# Patient Record
Sex: Female | Born: 1940
Health system: Southern US, Community
[De-identification: ages and names within clinical notes are randomized; demographics above are authoritative.]

## PROBLEM LIST (undated history)

## (undated) DIAGNOSIS — G43909 Migraine, unspecified, not intractable, without status migrainosus: Secondary | ICD-10-CM

## (undated) DIAGNOSIS — C73 Malignant neoplasm of thyroid gland: Secondary | ICD-10-CM

## (undated) DIAGNOSIS — E039 Hypothyroidism, unspecified: Secondary | ICD-10-CM

## (undated) DIAGNOSIS — E063 Autoimmune thyroiditis: Secondary | ICD-10-CM

## (undated) HISTORY — DX: Autoimmune thyroiditis: E06.3

## (undated) HISTORY — PX: FOOT SURGERY: SHX648

## (undated) HISTORY — DX: Malignant neoplasm of thyroid gland: C73

## (undated) HISTORY — DX: Migraine, unspecified, not intractable, without status migrainosus: G43.909

## (undated) HISTORY — DX: Hypothyroidism, unspecified: E03.9

## (undated) HISTORY — PX: APPENDECTOMY: SHX54

## (undated) HISTORY — PX: THYROIDECTOMY: SHX17

## (undated) HISTORY — PX: CHOLECYSTECTOMY: SHX55

---

## 1999-01-08 ENCOUNTER — Other Ambulatory Visit: Admission: RE | Admit: 1999-01-08 | Discharge: 1999-01-08 | Payer: Self-pay | Admitting: Obstetrics and Gynecology

## 2000-04-28 ENCOUNTER — Encounter: Admission: RE | Admit: 2000-04-28 | Discharge: 2000-04-28 | Payer: Self-pay | Admitting: Obstetrics and Gynecology

## 2000-04-28 ENCOUNTER — Encounter: Payer: Self-pay | Admitting: Obstetrics and Gynecology

## 2000-06-22 ENCOUNTER — Other Ambulatory Visit: Admission: RE | Admit: 2000-06-22 | Discharge: 2000-06-22 | Payer: Self-pay | Admitting: Family Medicine

## 2001-05-04 ENCOUNTER — Observation Stay (HOSPITAL_COMMUNITY): Admission: RE | Admit: 2001-05-04 | Discharge: 2001-05-05 | Payer: Self-pay | Admitting: Orthopedic Surgery

## 2001-07-06 ENCOUNTER — Encounter: Payer: Self-pay | Admitting: Obstetrics and Gynecology

## 2001-07-06 ENCOUNTER — Encounter: Admission: RE | Admit: 2001-07-06 | Discharge: 2001-07-06 | Payer: Self-pay | Admitting: Obstetrics and Gynecology

## 2002-01-24 ENCOUNTER — Encounter: Payer: Self-pay | Admitting: Family Medicine

## 2002-01-24 ENCOUNTER — Encounter: Admission: RE | Admit: 2002-01-24 | Discharge: 2002-01-24 | Payer: Self-pay | Admitting: Family Medicine

## 2002-03-07 ENCOUNTER — Other Ambulatory Visit: Admission: RE | Admit: 2002-03-07 | Discharge: 2002-03-07 | Payer: Self-pay | Admitting: Obstetrics and Gynecology

## 2002-04-20 ENCOUNTER — Ambulatory Visit (HOSPITAL_COMMUNITY): Admission: RE | Admit: 2002-04-20 | Discharge: 2002-04-20 | Payer: Self-pay | Admitting: Orthopedic Surgery

## 2002-04-20 ENCOUNTER — Encounter: Payer: Self-pay | Admitting: Orthopedic Surgery

## 2002-10-18 ENCOUNTER — Encounter: Admission: RE | Admit: 2002-10-18 | Discharge: 2002-10-18 | Payer: Self-pay | Admitting: Obstetrics and Gynecology

## 2002-10-18 ENCOUNTER — Encounter: Payer: Self-pay | Admitting: Obstetrics and Gynecology

## 2003-11-20 ENCOUNTER — Other Ambulatory Visit: Admission: RE | Admit: 2003-11-20 | Discharge: 2003-11-20 | Payer: Self-pay | Admitting: Obstetrics and Gynecology

## 2003-12-25 ENCOUNTER — Ambulatory Visit (HOSPITAL_COMMUNITY): Admission: RE | Admit: 2003-12-25 | Discharge: 2003-12-25 | Payer: Self-pay | Admitting: Obstetrics and Gynecology

## 2004-08-28 ENCOUNTER — Ambulatory Visit (HOSPITAL_COMMUNITY): Admission: RE | Admit: 2004-08-28 | Discharge: 2004-08-28 | Payer: Self-pay | Admitting: Family Medicine

## 2004-12-01 ENCOUNTER — Other Ambulatory Visit: Admission: RE | Admit: 2004-12-01 | Discharge: 2004-12-01 | Payer: Self-pay | Admitting: Endocrinology

## 2005-01-06 ENCOUNTER — Observation Stay (HOSPITAL_COMMUNITY): Admission: RE | Admit: 2005-01-06 | Discharge: 2005-01-07 | Payer: Self-pay | Admitting: Surgery

## 2005-01-06 ENCOUNTER — Encounter (INDEPENDENT_AMBULATORY_CARE_PROVIDER_SITE_OTHER): Payer: Self-pay | Admitting: Specialist

## 2005-01-21 ENCOUNTER — Emergency Department (HOSPITAL_COMMUNITY): Admission: EM | Admit: 2005-01-21 | Discharge: 2005-01-21 | Payer: Self-pay | Admitting: Emergency Medicine

## 2005-01-26 ENCOUNTER — Encounter (HOSPITAL_COMMUNITY): Admission: RE | Admit: 2005-01-26 | Discharge: 2005-04-26 | Payer: Self-pay | Admitting: Endocrinology

## 2005-03-09 ENCOUNTER — Ambulatory Visit (HOSPITAL_COMMUNITY): Admission: RE | Admit: 2005-03-09 | Discharge: 2005-03-09 | Payer: Self-pay | Admitting: Obstetrics and Gynecology

## 2005-03-17 ENCOUNTER — Other Ambulatory Visit: Admission: RE | Admit: 2005-03-17 | Discharge: 2005-03-17 | Payer: Self-pay | Admitting: Obstetrics and Gynecology

## 2006-01-25 ENCOUNTER — Encounter (HOSPITAL_COMMUNITY): Admission: RE | Admit: 2006-01-25 | Discharge: 2006-04-25 | Payer: Self-pay | Admitting: Endocrinology

## 2006-01-29 ENCOUNTER — Encounter (HOSPITAL_COMMUNITY): Admission: RE | Admit: 2006-01-29 | Discharge: 2006-04-29 | Payer: Self-pay | Admitting: Endocrinology

## 2006-03-10 ENCOUNTER — Ambulatory Visit (HOSPITAL_COMMUNITY): Admission: RE | Admit: 2006-03-10 | Discharge: 2006-03-10 | Payer: Self-pay | Admitting: Obstetrics and Gynecology

## 2006-07-12 ENCOUNTER — Ambulatory Visit (HOSPITAL_COMMUNITY): Admission: RE | Admit: 2006-07-12 | Discharge: 2006-07-12 | Payer: Self-pay | Admitting: Endocrinology

## 2006-08-03 ENCOUNTER — Emergency Department (HOSPITAL_COMMUNITY): Admission: EM | Admit: 2006-08-03 | Discharge: 2006-08-04 | Payer: Self-pay | Admitting: Emergency Medicine

## 2006-11-02 HISTORY — PX: BREAST EXCISIONAL BIOPSY: SUR124

## 2007-03-29 ENCOUNTER — Ambulatory Visit (HOSPITAL_COMMUNITY): Admission: RE | Admit: 2007-03-29 | Discharge: 2007-03-29 | Payer: Self-pay | Admitting: Obstetrics and Gynecology

## 2007-04-05 ENCOUNTER — Encounter (INDEPENDENT_AMBULATORY_CARE_PROVIDER_SITE_OTHER): Payer: Self-pay | Admitting: Diagnostic Radiology

## 2007-04-05 ENCOUNTER — Encounter: Admission: RE | Admit: 2007-04-05 | Discharge: 2007-04-05 | Payer: Self-pay | Admitting: Obstetrics and Gynecology

## 2007-04-20 ENCOUNTER — Encounter: Admission: RE | Admit: 2007-04-20 | Discharge: 2007-04-20 | Payer: Self-pay | Admitting: Interventional Radiology

## 2007-06-09 ENCOUNTER — Encounter: Admission: RE | Admit: 2007-06-09 | Discharge: 2007-06-09 | Payer: Self-pay | Admitting: Interventional Radiology

## 2007-06-13 ENCOUNTER — Encounter (INDEPENDENT_AMBULATORY_CARE_PROVIDER_SITE_OTHER): Payer: Self-pay | Admitting: Surgery

## 2007-06-13 ENCOUNTER — Encounter: Admission: RE | Admit: 2007-06-13 | Discharge: 2007-06-13 | Payer: Self-pay | Admitting: Surgery

## 2007-06-13 ENCOUNTER — Ambulatory Visit (HOSPITAL_BASED_OUTPATIENT_CLINIC_OR_DEPARTMENT_OTHER): Admission: RE | Admit: 2007-06-13 | Discharge: 2007-06-13 | Payer: Self-pay | Admitting: Surgery

## 2007-06-16 ENCOUNTER — Encounter: Admission: RE | Admit: 2007-06-16 | Discharge: 2007-06-16 | Payer: Self-pay | Admitting: Interventional Radiology

## 2007-07-12 ENCOUNTER — Encounter: Admission: RE | Admit: 2007-07-12 | Discharge: 2007-07-12 | Payer: Self-pay | Admitting: Interventional Radiology

## 2007-11-30 ENCOUNTER — Encounter: Admission: RE | Admit: 2007-11-30 | Discharge: 2007-11-30 | Payer: Self-pay | Admitting: Interventional Radiology

## 2008-03-12 IMAGING — US EM OFFICE/OP CONSULT LEVEL 3 (40)
1 series · 12 of 16 positions shown · non-contrast
Comparison: none

[Series 1: em office/op consult level 3 (40) · 12 of 26 slices shown]
[im 1/26]
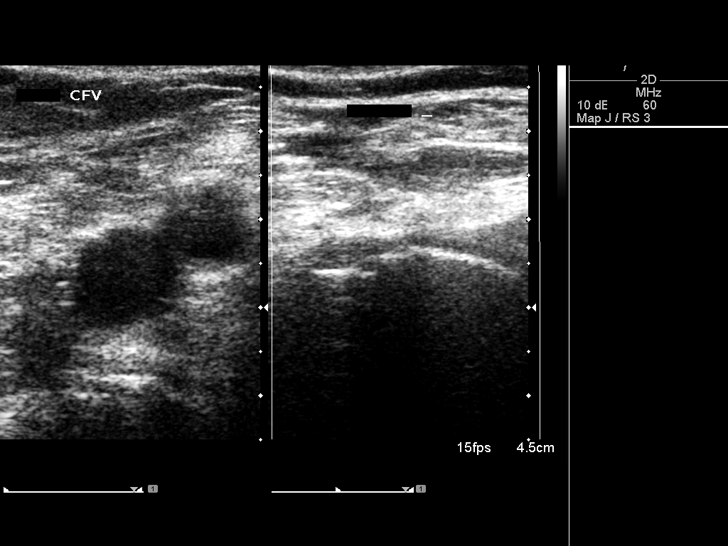
[im 4/26]
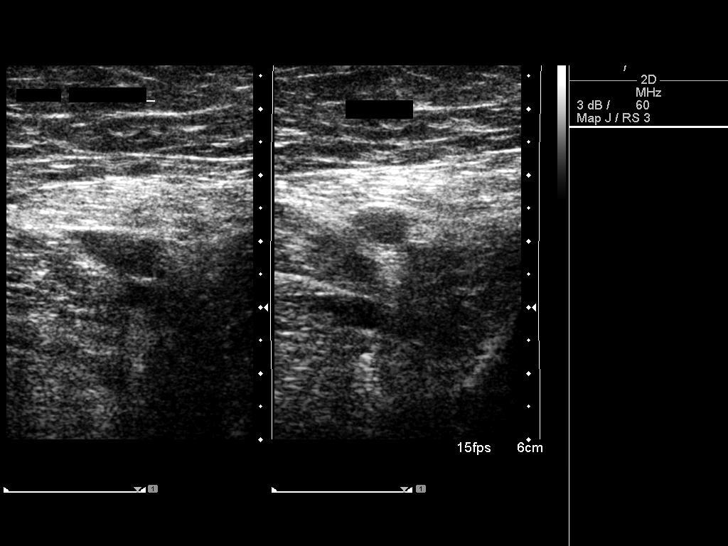
[im 6/26]
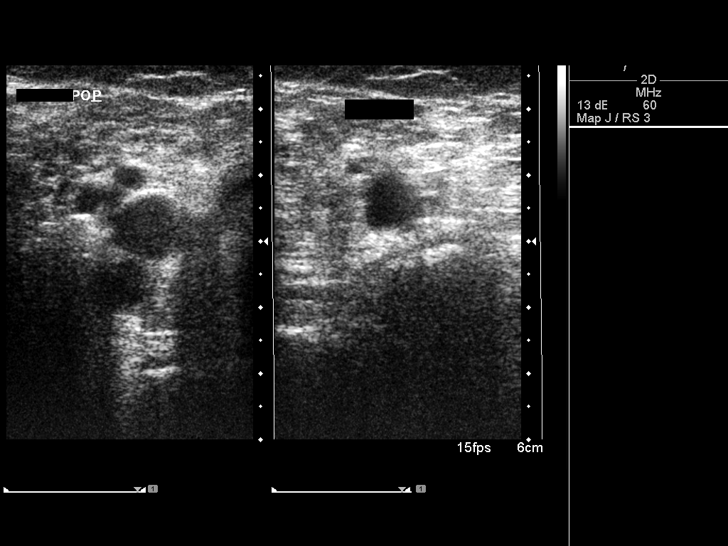
[im 7/26]
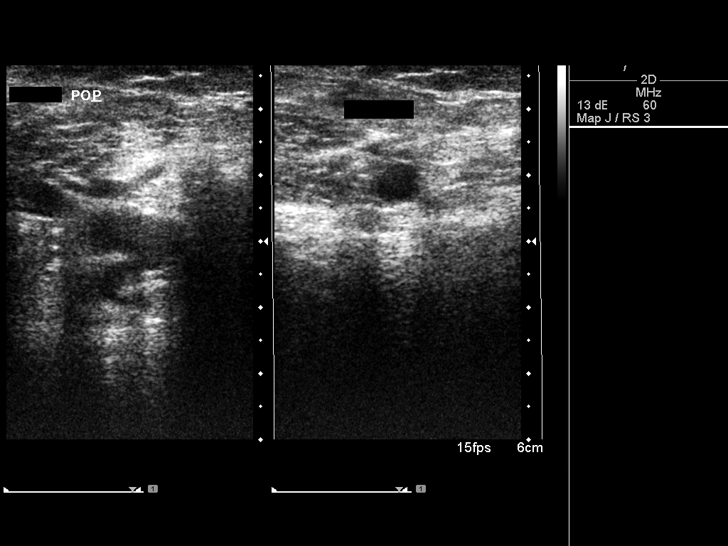
[im 11/26]
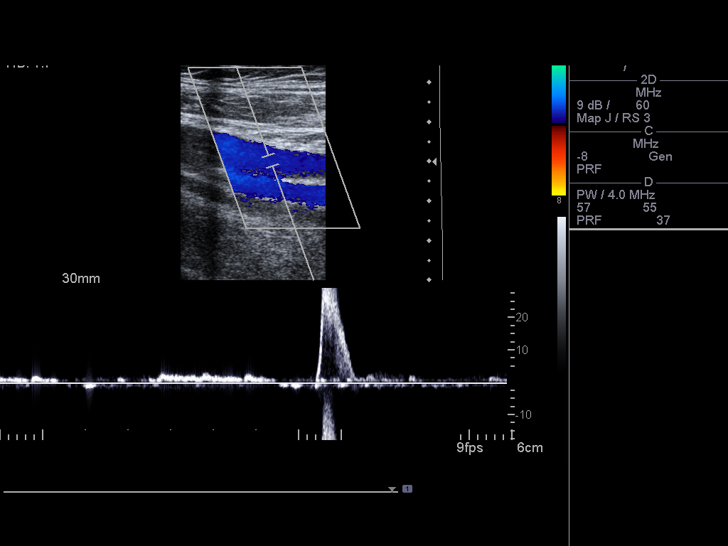
[im 12/26]
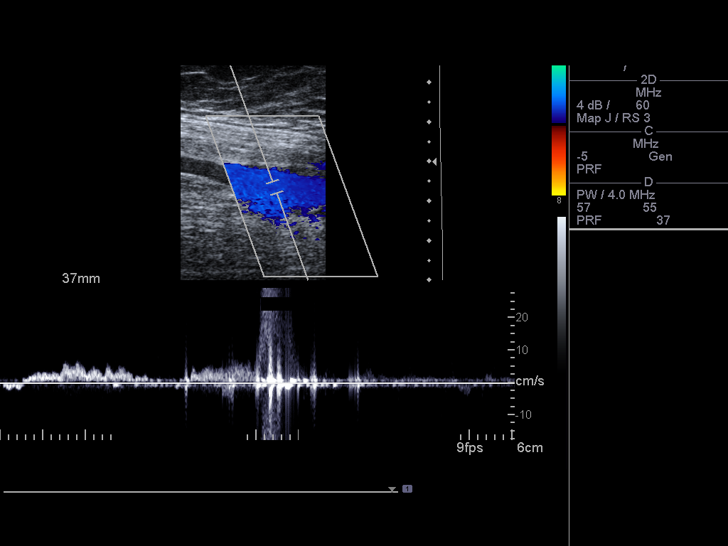
[im 14/26]
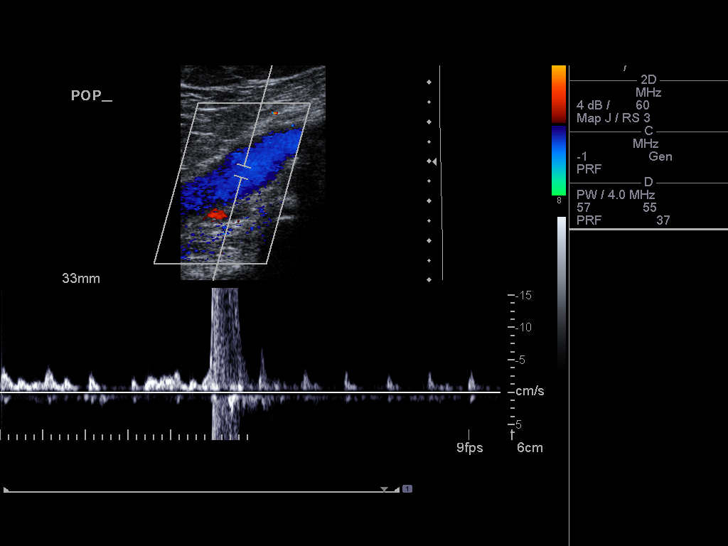
[im 17/26]
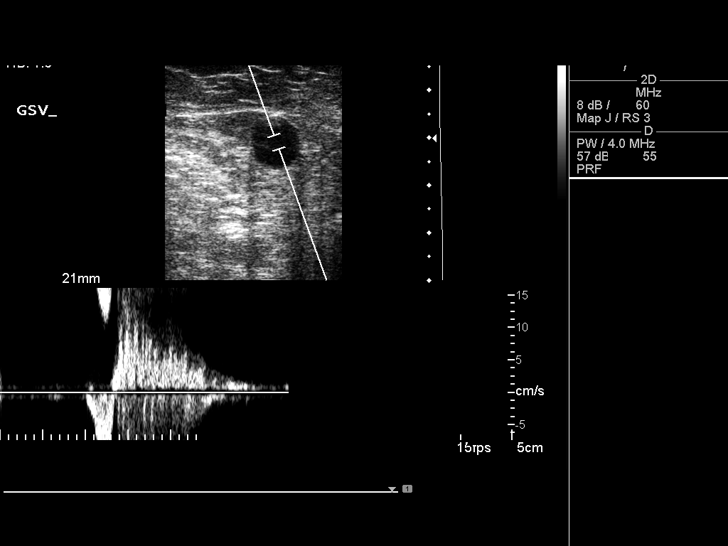
[im 19/26]
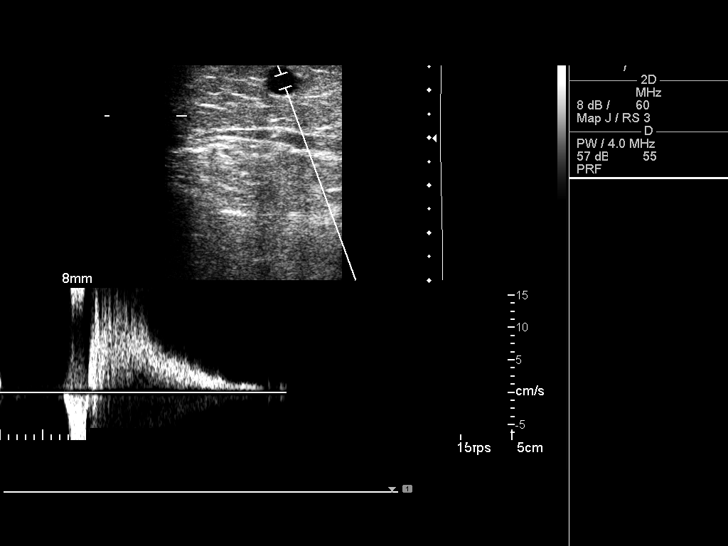
[im 21/26]
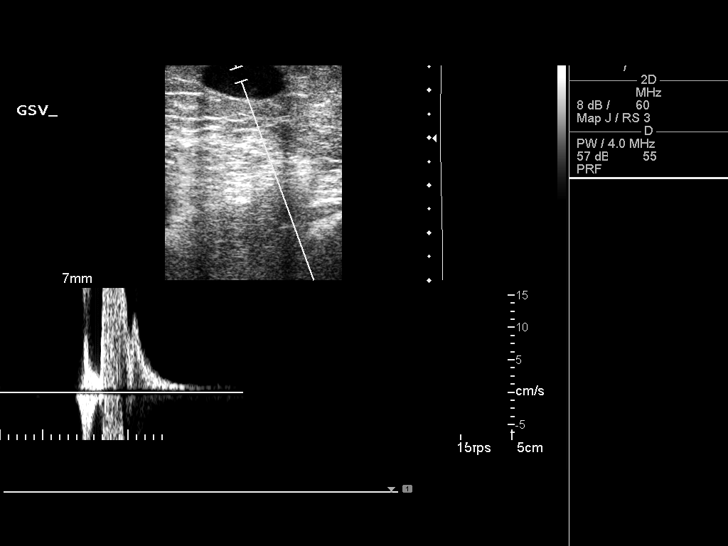
[im 24/26]
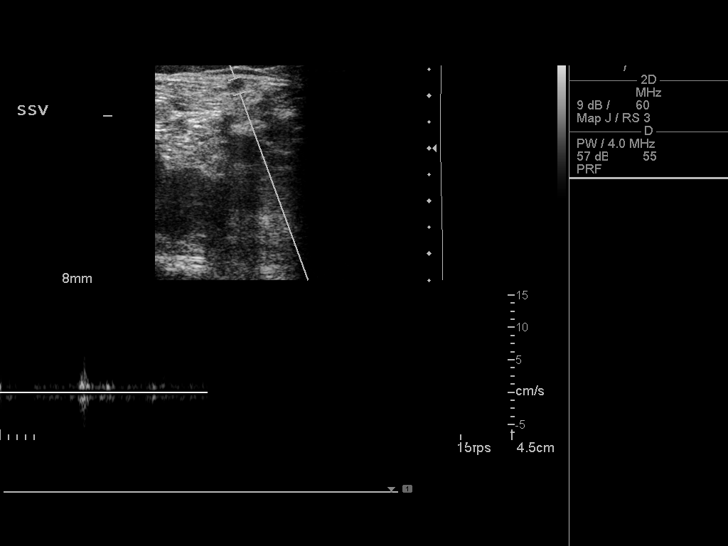
[im 26/26]
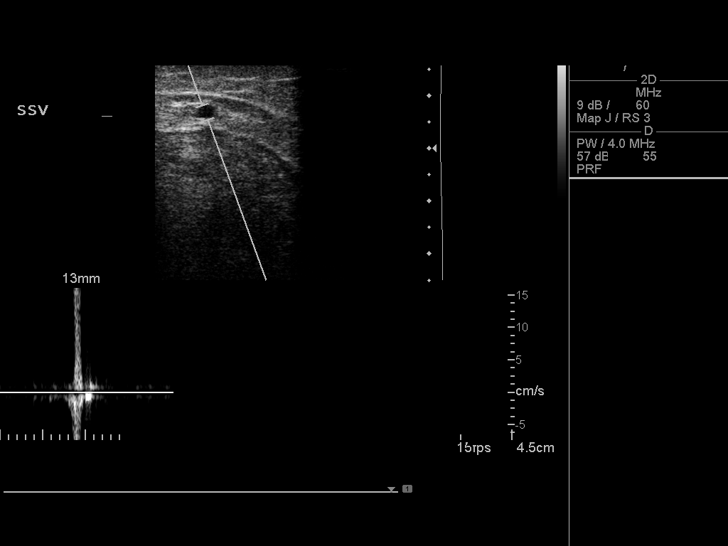

[12 of 16 positions shown; findings below may reference images not displayed]

Addendum Begins
Original report dictated by Dr. Lejla Up.  Addendum dictated by Dr. Lejla Up.
The patient?s symptomatic varicose veins with ankle edema correspond to CEAP Grade III.  The previous evaluation at [facility] on 05/07/04 (enclosed) also demonstrated reflux throughout the left GSV.  There is no evidence of adenopathy to suggest external compression as a cause for her leg swelling.  There is no contralateral swelling to suggest CHF as an etiology.  Her lack of response to the use of graduated compression stockings since 9446 represents failure of traditional conservative treatment options, thus she should be considered an appropriate candidate with medical indications for treatment of the underlying refluxing venous segment as previously detailed.  

Addendum Ends
Outpatient Consultation - [DATE]
Snehal Eells, M.D.
[REDACTED] [REDACTED]
[address]
[HOSPITAL],  [REDACTED].  [id number] 
RE:  Labelle Salha Brack Tiger (DOB - [birth year]):
Dear Dr. Rantona:  
Thank you for the referral of your patient, Klpigbb Moolman, for consultation regarding her symptomatic left lower extremity varicose veins.  
As you know, this pleasant 66 year old [place] immigrant has had several years of visible left leg varicose veins.  She has had previous vein evaluation by other local providers in 9446, but no varicose vein treatments.  Over the past year or two, she has noticed worsening left lower extremity edema primarily at the level of the ankle which is worse at night.  Over the past two months, she has noticed a change in her gait with a limp.  She has left leg pain and tiredness.  The symptoms have reduced her exercise capabilities, and have reduced the distance she can walk to less than a quarter mile.  She has no history of venous ulceration, DVT, superficial thrombophlebitis, or PE.  Her mother did suffer from symptomatic varicose veins as well.  She feels like there has been exacerbation of her varicose veins after her three pregnancies.  She uses Advil for pain control, with partial relief of symptoms.  She finds that elevation of the legs also provides some relief of symptoms.  She has used graduated compression hose intermittently.  
Past medical history is significant for appendectomy, thyroidectomy for thyroid carcinoma in 0666, bunionectomy, cholecystectomy, hypertension, migraines.  
Current medications include Synthroid 125 mcg q day, Atenolol 25 mg q day, Argotamine prn, and Shobhit Mannion.  
She has an allergy to Lipitor.  No known allergy to contrast or Latex.  
Review of systems is negative for myocardial infarction, infarct, bleeding diathesis.  Positive for glasses.  
On exam, she has moderately prominent tortuous varicose veins along the medial aspect of her left thigh and calf extending to the foot.  There is mild left leg edema this morning on exam.  Good distal pulses.  No skin ulceration, discoloration or trophic change.  She is afebrile, pulse 78, blood pressure 126/90, respirations 15, height 5'5", weight 184.  
Today?s venous Doppler examination reported separately shows a normal left leg deep venous system.  The short saphenous vein is unremarkable.  The left greater saphenous vein is dilated with evidence of valvular incompetence and reflux, with direct communication to the large visible varicose veins.  
We spent the majority of the 45 minute consultation discussing pathophysiology of varicose veins, reviewing the ultrasound findings, and discussing varicose vein treatment options.  We discussed in particular the endovascular laser ablation of this underlying abnormal refluxing GSV segment, for which she would be an appropriate candidate.  We discussed the technique, anticipated benefits, possible risks and complications, and alternatives.  I gave her some literature to review.  She seemed to understand and did ask appropriate questions.  She was motivated to proceed with treatment.  Accordingly, we will set this up at her convenience.  In the meantime, I have asked our nurse to fit her appropriately for 20 - 30 mm Hg thigh high compression hose.  
I appreciate your referral of this very pleasant patient.  I will keep you updated with her progress. 
Sincerely, 
DDH:chc

## 2008-06-03 IMAGING — US US EXTREM LOW VENOUS*L*
1 series · 14 of 24 positions shown · non-contrast
Comparison: none

CLINICAL DATA: Symptomatic left leg varicose veins, now one month status post
transcatheter laser occlusion of the   greater saphenous vein.

Left lower extremity venous Doppler ultrasound:
TECHNIQUE: Gray-scale sonography with compression as well as color and duplex
Doppler ultrasound were performed to evaluate   the saphenous vein and the deep
venous system from the level of the common femoral vein through the popliteal
and proximal calf veins.

[Series 1: us extrem low venous*left* · 14 of 31 slices shown]
[im 1/31]
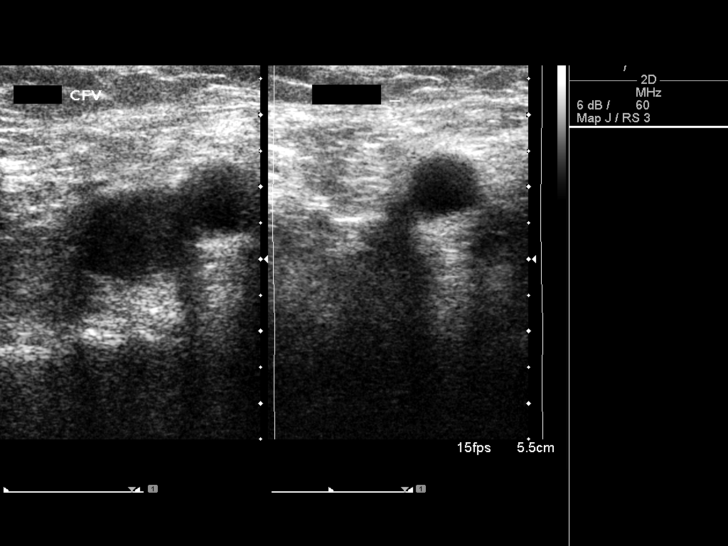
[im 3/31]
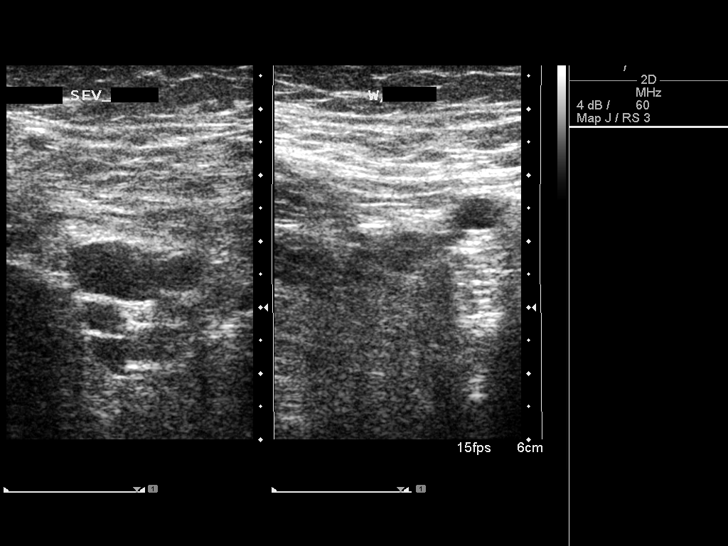
[im 6/31]
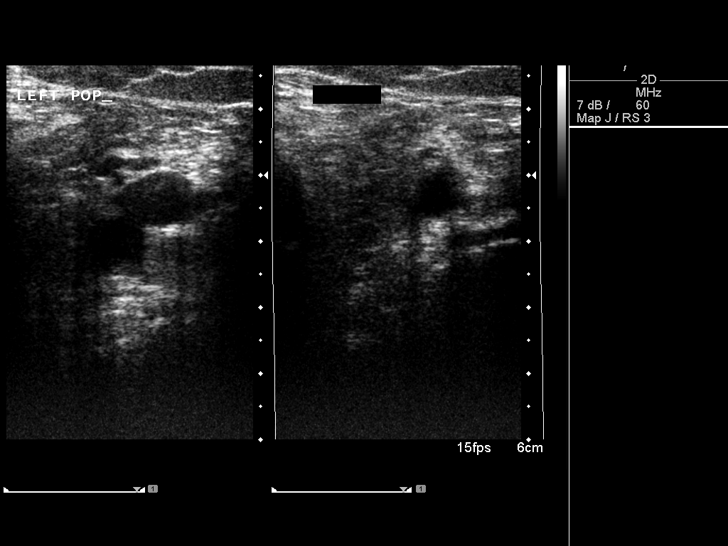
[im 8/31]
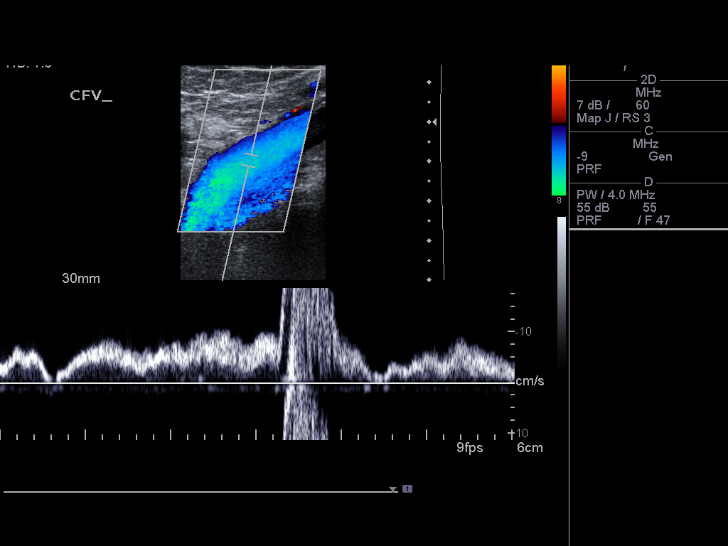
[im 10/31]
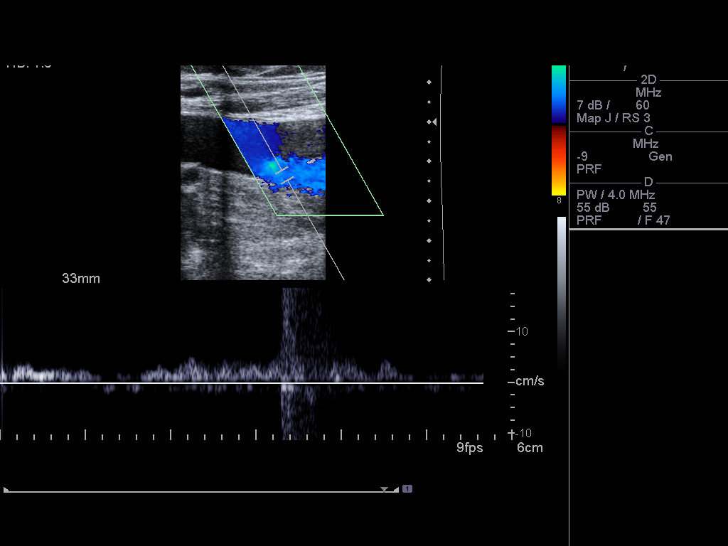
[im 12/31]
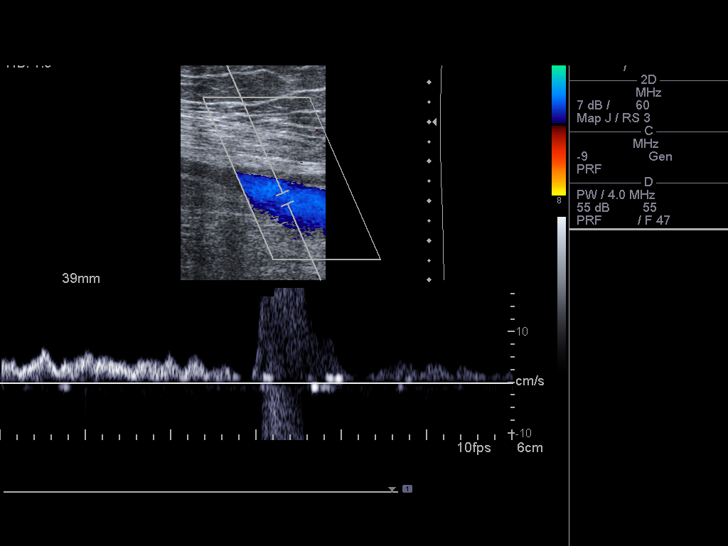
[im 15/31]
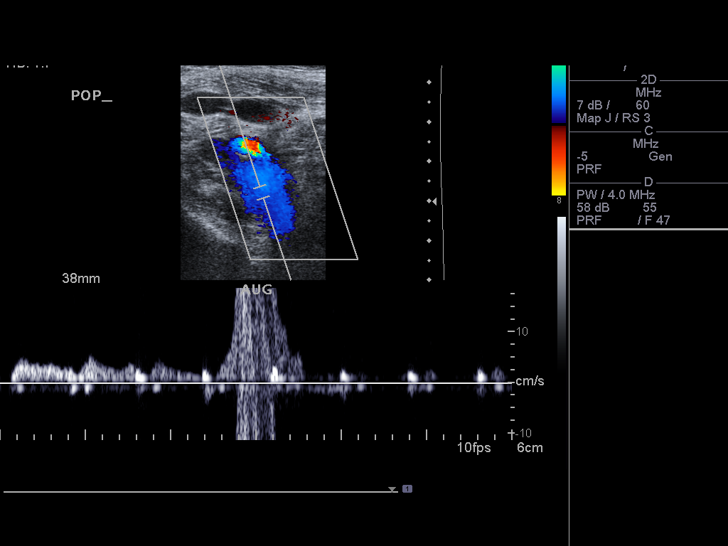
[im 16/31]
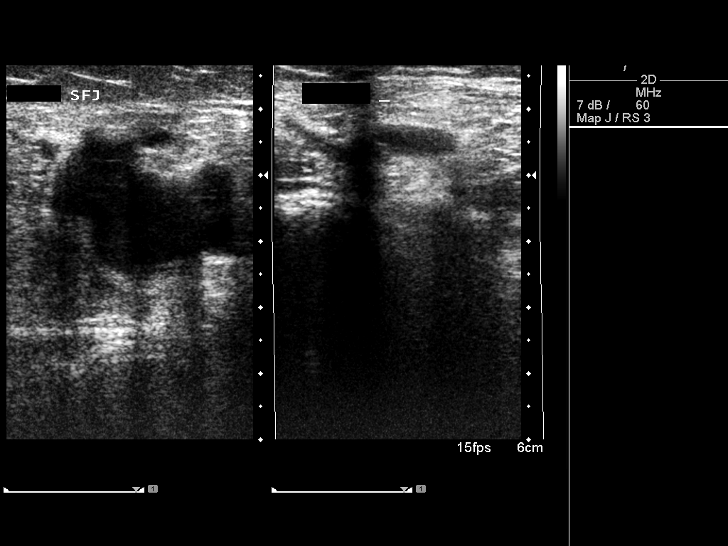
[im 19/31]
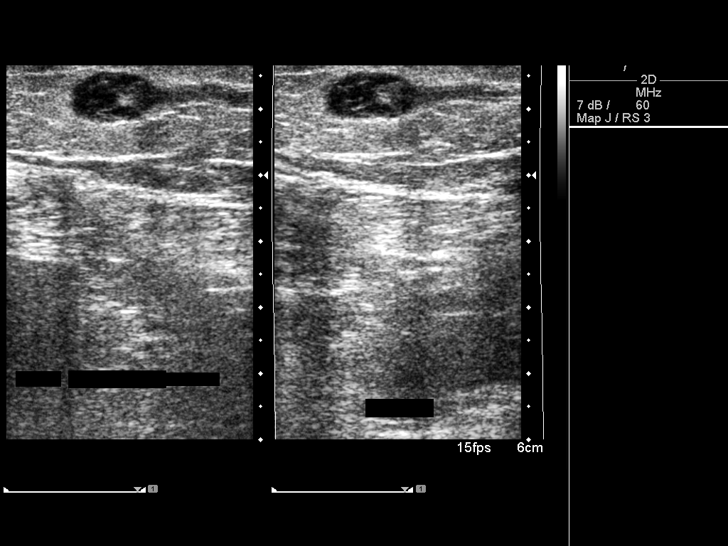
[im 21/31]
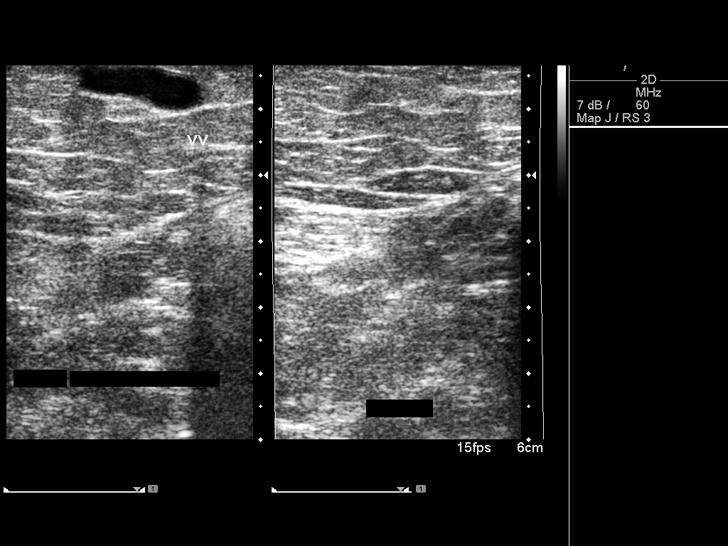
[im 24/31]
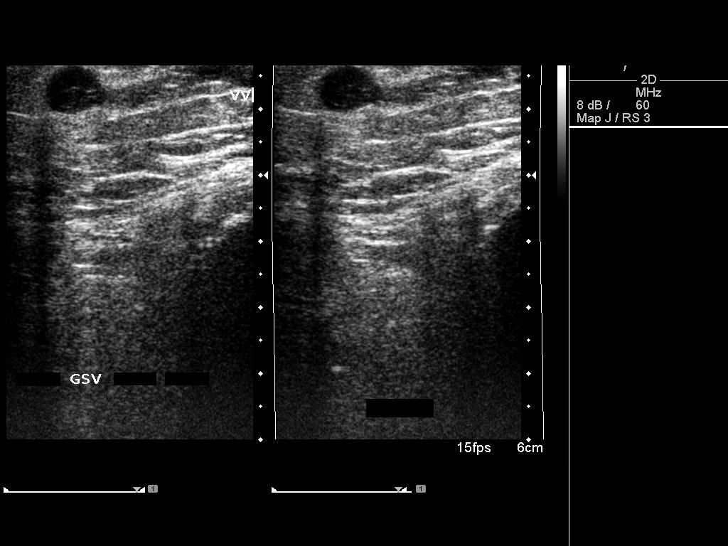
[im 25/31]
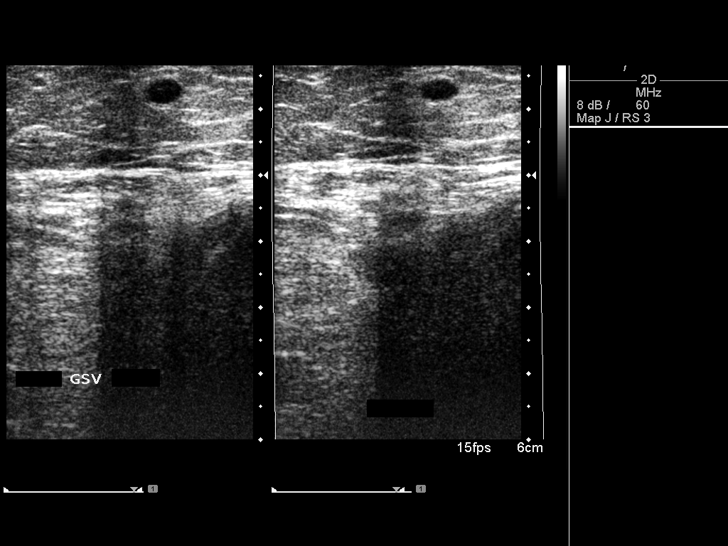
[im 28/31]
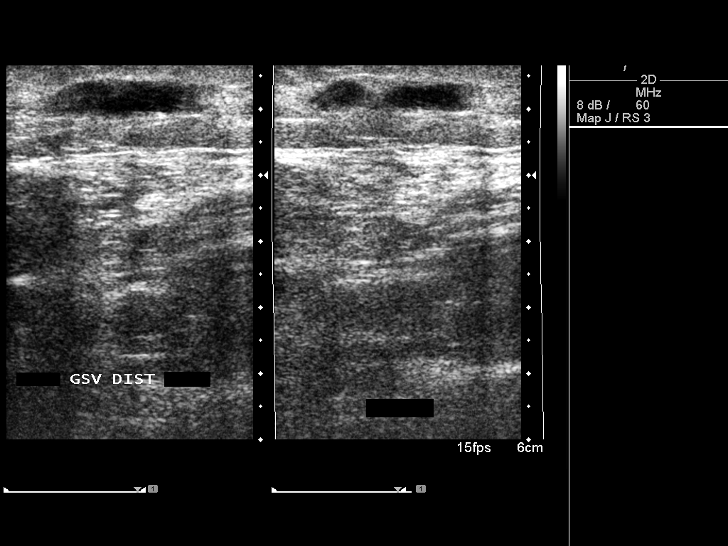
[im 31/31]
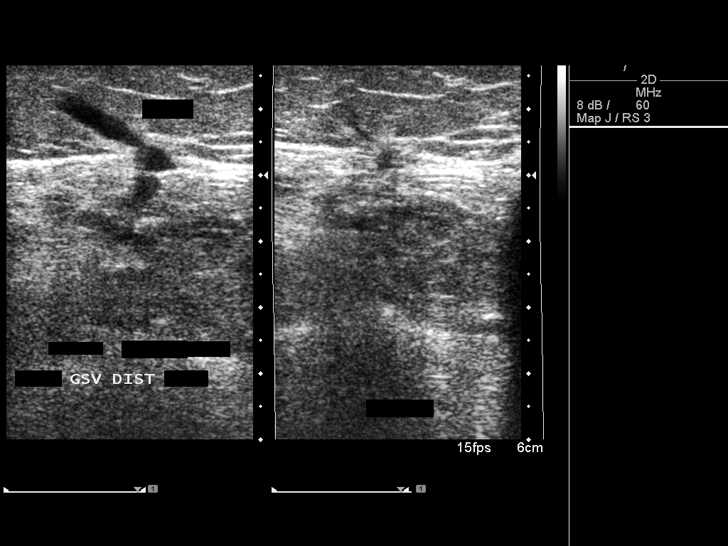

[14 of 24 positions shown; findings below may reference images not displayed]

FINDINGS: The lower extremity deep venous system demonstrates normal
compressibility, phasicity, and augmentation. Posterior tibial vein
unremarkable. No evidence of DVT.

The greater saphenous vein is occluded throughout the treatment segment.
Associated varicose veins in the thigh and calf region are decompressed.
IMPRESSION: 1. Technically successful occlusion of the greater saphenous vein along the
treatment length without apparent complication.
2. Normal deep venous system. No DVT.

## 2009-08-19 ENCOUNTER — Ambulatory Visit (HOSPITAL_COMMUNITY): Admission: RE | Admit: 2009-08-19 | Discharge: 2009-08-19 | Payer: Self-pay | Admitting: Family Medicine

## 2010-09-22 ENCOUNTER — Encounter: Admission: RE | Admit: 2010-09-22 | Discharge: 2010-09-22 | Payer: Self-pay | Admitting: Family Medicine

## 2011-03-16 ENCOUNTER — Ambulatory Visit: Payer: Medicare Other | Attending: Family Medicine | Admitting: Physical Therapy

## 2011-03-16 DIAGNOSIS — M546 Pain in thoracic spine: Secondary | ICD-10-CM | POA: Insufficient documentation

## 2011-03-16 DIAGNOSIS — M2560 Stiffness of unspecified joint, not elsewhere classified: Secondary | ICD-10-CM | POA: Insufficient documentation

## 2011-03-16 DIAGNOSIS — R293 Abnormal posture: Secondary | ICD-10-CM | POA: Insufficient documentation

## 2011-03-16 DIAGNOSIS — IMO0001 Reserved for inherently not codable concepts without codable children: Secondary | ICD-10-CM | POA: Insufficient documentation

## 2011-03-16 DIAGNOSIS — M542 Cervicalgia: Secondary | ICD-10-CM | POA: Insufficient documentation

## 2011-03-17 NOTE — Op Note (Signed)
[name], [name]                  ACCOUNT NO.:  [id number]   MEDICAL RECORD NO.:  [id number]          PATIENT TYPE:  AMB   LOCATION:  DSC                          FACILITY:  [facility]   PHYSICIAN:  Velora Heckler, MD      DATE OF BIRTH:  06/18/1941   DATE OF PROCEDURE:  06/13/2007  DATE OF DISCHARGE:                               OPERATIVE REPORT   PREOPERATIVE DIAGNOSIS:  Abnormal mammogram.   POSTOPERATIVE DIAGNOSIS:  Abnormal mammogram.   PROCEDURE:  Left breast excisional biopsy with wire localization.   SURGEON:  Velora Heckler, MD, FACS   ANESTHESIA:  Local with intravenous sedation.   PREPARATION:  Betadine.   BLOOD LOSS:  Minimal.   INDICATIONS:  The patient is a 70 year old white female well known to my  practice.  The patient had routine screening mammography May 2008.  An  abnormality was found on the left breast.  Core needle biopsy was  obtained.  Pathology results were benign but were felt to be discordant  with mammographic features and excisional biopsy was recommended.  The  patient now comes to the operating room for excision.   DESCRIPTION OF PROCEDURE:  Procedure was done in OR #6 at the [facility].  The patient had previously been to [facility] where wire localization was performed.  The patient is  brought to the operating room #6 and placed in a supine position on the  operating room table.  Following administration of intravenous sedation,  the patient is prepped and draped in the usual strict aseptic fashion.  After ascertaining that an adequate level sedation had been obtained,  the skin around the guidewire in the lateral left breast is anesthetized  with local anesthetic.  A curvilinear incision is  made at the site of  guidewire placement.  Dissection is  carried into subcutaneous tissues.  A core of tissue is excised around the guidewire using the  electrocautery for hemostasis down to the chest wall.  Specimen  is  completely excised and submitted for specimen mammography which confirms  that the lesion in question is present within the sample.  Good  hemostasis is obtained throughout.  Skin is closed with running 4-0 Vicryl subcuticular suture.  Wound is  washed and dried and Benzoin and Steri-Strips are applied.  Sterile  dressings are applied.  The patient is transferred to the recovery room  in stable condition.  The patient tolerated the procedure well.      Velora Heckler, MD  Electronically Signed     TMG/MEDQ  D:  06/13/2007  T:  06/14/2007  Job:  [id number]   cc:   Norva Pavlov, M.D.  Dorisann Frames, M.D.  Anna Genre Little, M.D.

## 2011-03-20 NOTE — Op Note (Signed)
[name], [name]                  ACCOUNT NO.:  [id number]   MEDICAL RECORD NO.:  [id number]          PATIENT TYPE:  AMB   LOCATION:  DAY                          FACILITY:  [facility]   PHYSICIAN:  Melinda Heckler, MD      DATE OF BIRTH:  October 14, 1941   DATE OF PROCEDURE:  01/06/2005  DATE OF DISCHARGE:                                 OPERATIVE REPORT   PREOPERATIVE DIAGNOSIS:  Right thyroid nodule.   POSTOPERATIVE DIAGNOSIS:  Papillary thyroid carcinoma.   PROCEDURE:  Total thyroidectomy.   SURGEON:  Melinda Heckler, MD   ASSISTANT:  [name], M.D.   ANESTHESIA:  General.   ESTIMATED BLOOD LOSS:  Minimal.   PREPARATION:  Betadine.   COMPLICATIONS:  None.   INDICATIONS:  Patient is a 70 year old white female referred by Dr. Dorisann Terry with thyroid nodule.  This has been found on routine physical  examination in January, 2006.  Fine needle aspiration demonstrated atypical  cell groups suspicious for papillary thyroid carcinoma.  The patient now  comes to surgery for resection.   BODY OF REPORT:  The procedure is done in OR #4 at the [facility].  Patient is brought to the operating room and placed in a supine  position on the operating room table.  Following administration of general  anesthesia, the patient is positioned and then prepped and draped in the  usual strict aseptic fashion.  After ascertaining that an adequate level of  anesthesia had been obtained, a Kocher incision is made with a #15 blade.  Dissection is carried down through subcutaneous tissues and platysma, and  hemostasis obtained with the electrocautery.  Skin flaps are elevated  cephalad and caudad from the thyroid notch to the sternal notch.  A Mahorner  self-retaining retractor is placed for exposure.  Strap muscles are incised  in the midline, and dissection is begun on the right side.  Strap muscles  are reflected laterally.  Venous tributaries are divided between small  and  medium Ligaclips.  The superior pole is carefully dissected out.  Superior  pole vessels are ligated in continuity with 2-0 silk ties and medium  Ligaclips and divided.  The entire thyroid gland was quite firm, consistent  with a thyroiditis etiology.  Inferior venous tributaries are divided  between medium Ligaclips.  The gland is rolled anteriorly.  The parathyroid  tissue is identified and preserved.  Branches of the inferior thyroid artery  are divided between small Ligaclips.  Recurrent nerve is identified and  preserved.  The ligament of Allyson Sabal is transected, and the gland is rolled up  and onto the anterior trachea.  The isthmus is mobilized across the midline.  The isthmus is transected between hemostats and suture-ligated with 3-0  Vicryl suture ligatures.  The right lobe is submitted to pathology, where  frozen section confirms the background of Hashimoto's thyroiditis; however,  papillary thyroid carcinoma is present.  Therefore, we proceeded with left  thyroid lobectomy.  Strap muscles are again reflected laterally.  Venous  tributaries are divided between medium  Ligaclips.  The superior pole is  dissected out.  The superior pole vessels are ligated in continuity with 2-0  silk ties and medium Ligaclips and divided.  The left lobe also is quite  firm throughout the entire parenchyma, again consistent with thyroiditis.  Branches of the inferior thyroid artery are divided between small Ligaclips.  The parathyroid tissue is identified and preserved.  The recurrent laryngeal  nerve is identified and preserved.  The inferior venous tributaries are  ligated in continuity with 2-0 silk ties and divided.  Ligament of Allyson Sabal is  transected, and the gland is rolled up and onto the trachea, from which it  is completely excised with the electrocautery.  The left lobe was submitted  separately to pathology for permanent sections only.  The neck is irrigated  with warm saline.  Good  hemostasis is noted.  Surgicel is placed over the  area of the recurrent laryngeal nerves bilaterally.  The strap muscles are  reapproximated in the midline with interrupted 3-0 Vicryl sutures.  The  platysma is closed with interrupted 3-0 Vicryl sutures.  The skin is closed  with a running 4-0 Vicryl subcuticular suture.  The wound is washed and  dried, and Benzoin and Steri-Strips are applied.  Sterile dressings are  applied.  The patient is awakened from anesthesia and brought to the  recovery room in stable condition.  The patient tolerated the procedure  well.      TMG/MEDQ  D:  01/06/2005  T:  01/06/2005  Job:  [id number]   cc:   [name], M.D.  [place] [place], [place] [address]  Fax: [phone]   [name], M.D.  [address]  [place]  [place] [address]  Fax: [phone]

## 2011-03-23 ENCOUNTER — Ambulatory Visit: Payer: Medicare Other

## 2011-03-25 ENCOUNTER — Ambulatory Visit: Payer: Medicare Other

## 2011-04-01 ENCOUNTER — Ambulatory Visit: Payer: Medicare Other

## 2011-04-06 ENCOUNTER — Ambulatory Visit: Payer: Medicare Other | Attending: Family Medicine

## 2011-04-06 DIAGNOSIS — R293 Abnormal posture: Secondary | ICD-10-CM | POA: Insufficient documentation

## 2011-04-06 DIAGNOSIS — M542 Cervicalgia: Secondary | ICD-10-CM | POA: Insufficient documentation

## 2011-04-06 DIAGNOSIS — IMO0001 Reserved for inherently not codable concepts without codable children: Secondary | ICD-10-CM | POA: Insufficient documentation

## 2011-04-06 DIAGNOSIS — M546 Pain in thoracic spine: Secondary | ICD-10-CM | POA: Insufficient documentation

## 2011-04-06 DIAGNOSIS — M2560 Stiffness of unspecified joint, not elsewhere classified: Secondary | ICD-10-CM | POA: Insufficient documentation

## 2011-04-13 ENCOUNTER — Ambulatory Visit: Payer: Medicare Other

## 2011-05-15 ENCOUNTER — Ambulatory Visit: Payer: Medicare Other | Attending: Family Medicine | Admitting: Physical Therapy

## 2011-05-15 DIAGNOSIS — M546 Pain in thoracic spine: Secondary | ICD-10-CM | POA: Insufficient documentation

## 2011-05-15 DIAGNOSIS — R293 Abnormal posture: Secondary | ICD-10-CM | POA: Insufficient documentation

## 2011-05-15 DIAGNOSIS — M542 Cervicalgia: Secondary | ICD-10-CM | POA: Insufficient documentation

## 2011-05-15 DIAGNOSIS — IMO0001 Reserved for inherently not codable concepts without codable children: Secondary | ICD-10-CM | POA: Insufficient documentation

## 2011-05-15 DIAGNOSIS — M2560 Stiffness of unspecified joint, not elsewhere classified: Secondary | ICD-10-CM | POA: Insufficient documentation

## 2011-05-18 ENCOUNTER — Ambulatory Visit: Payer: Medicare Other

## 2011-05-20 ENCOUNTER — Ambulatory Visit: Payer: Medicare Other

## 2011-05-25 ENCOUNTER — Ambulatory Visit: Payer: Medicare Other

## 2011-05-27 ENCOUNTER — Ambulatory Visit: Payer: Medicare Other

## 2011-06-10 ENCOUNTER — Encounter: Payer: Medicare Other | Admitting: Physical Therapy

## 2011-06-23 ENCOUNTER — Ambulatory Visit: Payer: Medicare Other | Attending: Urology | Admitting: Physical Therapy

## 2011-06-23 DIAGNOSIS — IMO0001 Reserved for inherently not codable concepts without codable children: Secondary | ICD-10-CM | POA: Insufficient documentation

## 2011-06-23 DIAGNOSIS — M542 Cervicalgia: Secondary | ICD-10-CM | POA: Insufficient documentation

## 2011-06-23 DIAGNOSIS — M2560 Stiffness of unspecified joint, not elsewhere classified: Secondary | ICD-10-CM | POA: Insufficient documentation

## 2011-06-23 DIAGNOSIS — R51 Headache: Secondary | ICD-10-CM | POA: Insufficient documentation

## 2011-06-23 DIAGNOSIS — M546 Pain in thoracic spine: Secondary | ICD-10-CM | POA: Insufficient documentation

## 2011-07-01 ENCOUNTER — Ambulatory Visit: Payer: Medicare Other

## 2011-07-03 ENCOUNTER — Encounter: Payer: Medicare Other | Admitting: Physical Therapy

## 2011-07-08 ENCOUNTER — Ambulatory Visit: Payer: Medicare Other | Attending: Urology

## 2011-07-08 DIAGNOSIS — R51 Headache: Secondary | ICD-10-CM | POA: Insufficient documentation

## 2011-07-08 DIAGNOSIS — M546 Pain in thoracic spine: Secondary | ICD-10-CM | POA: Insufficient documentation

## 2011-07-08 DIAGNOSIS — M542 Cervicalgia: Secondary | ICD-10-CM | POA: Insufficient documentation

## 2011-07-08 DIAGNOSIS — M2560 Stiffness of unspecified joint, not elsewhere classified: Secondary | ICD-10-CM | POA: Insufficient documentation

## 2011-07-08 DIAGNOSIS — IMO0001 Reserved for inherently not codable concepts without codable children: Secondary | ICD-10-CM | POA: Insufficient documentation

## 2011-07-10 ENCOUNTER — Ambulatory Visit: Payer: Medicare Other | Admitting: Physical Therapy

## 2011-08-17 LAB — BASIC METABOLIC PANEL
CO2: 27
Calcium: 9
Chloride: 109
Creatinine, Ser: 0.7
GFR calc Af Amer: >60
Sodium: 139

## 2011-08-17 LAB — POCT HEMOGLOBIN-HEMACUE
Hemoglobin: 15.3 — ABNORMAL HIGH
Operator id: 116011

## 2011-09-23 ENCOUNTER — Other Ambulatory Visit (HOSPITAL_COMMUNITY): Payer: Self-pay | Admitting: Obstetrics and Gynecology

## 2011-09-23 ENCOUNTER — Other Ambulatory Visit: Payer: Self-pay | Admitting: Obstetrics and Gynecology

## 2011-09-23 DIAGNOSIS — Z1231 Encounter for screening mammogram for malignant neoplasm of breast: Secondary | ICD-10-CM

## 2011-10-02 ENCOUNTER — Other Ambulatory Visit: Payer: Self-pay | Admitting: Obstetrics and Gynecology

## 2011-10-23 ENCOUNTER — Ambulatory Visit
Admission: RE | Admit: 2011-10-23 | Discharge: 2011-10-23 | Disposition: A | Discharge status: Home / Self Care | Payer: Medicare Other | Source: Ambulatory Visit | Attending: Obstetrics and Gynecology | Admitting: Obstetrics and Gynecology

## 2011-10-23 DIAGNOSIS — Z1231 Encounter for screening mammogram for malignant neoplasm of breast: Secondary | ICD-10-CM

## 2012-11-02 HISTORY — PX: BREAST CYST ASPIRATION: SHX578

## 2013-01-12 ENCOUNTER — Other Ambulatory Visit: Payer: Self-pay

## 2013-01-12 DIAGNOSIS — Z1231 Encounter for screening mammogram for malignant neoplasm of breast: Secondary | ICD-10-CM

## 2013-02-16 ENCOUNTER — Ambulatory Visit
Admission: RE | Admit: 2013-02-16 | Discharge: 2013-02-16 | Disposition: A | Discharge status: Home / Self Care | Payer: Medicare Other | Source: Ambulatory Visit

## 2013-02-16 DIAGNOSIS — Z1231 Encounter for screening mammogram for malignant neoplasm of breast: Secondary | ICD-10-CM

## 2013-02-17 ENCOUNTER — Other Ambulatory Visit: Payer: Self-pay | Admitting: Obstetrics and Gynecology

## 2013-02-17 DIAGNOSIS — R928 Other abnormal and inconclusive findings on diagnostic imaging of breast: Secondary | ICD-10-CM

## 2013-02-22 ENCOUNTER — Ambulatory Visit
Admission: RE | Admit: 2013-02-22 | Discharge: 2013-02-22 | Disposition: A | Discharge status: Home / Self Care | Payer: Medicare Other | Source: Ambulatory Visit | Attending: Obstetrics and Gynecology | Admitting: Obstetrics and Gynecology

## 2013-02-22 ENCOUNTER — Other Ambulatory Visit: Payer: Self-pay | Admitting: Obstetrics and Gynecology

## 2013-02-22 DIAGNOSIS — R928 Other abnormal and inconclusive findings on diagnostic imaging of breast: Secondary | ICD-10-CM

## 2013-03-01 ENCOUNTER — Ambulatory Visit
Admission: RE | Admit: 2013-03-01 | Discharge: 2013-03-01 | Disposition: A | Discharge status: Home / Self Care | Payer: Medicare Other | Source: Ambulatory Visit | Attending: Obstetrics and Gynecology | Admitting: Obstetrics and Gynecology

## 2013-03-01 ENCOUNTER — Other Ambulatory Visit: Payer: Self-pay | Admitting: Obstetrics and Gynecology

## 2013-03-01 DIAGNOSIS — R928 Other abnormal and inconclusive findings on diagnostic imaging of breast: Secondary | ICD-10-CM

## 2014-01-29 ENCOUNTER — Other Ambulatory Visit: Payer: Self-pay

## 2014-01-29 DIAGNOSIS — Z1231 Encounter for screening mammogram for malignant neoplasm of breast: Secondary | ICD-10-CM

## 2014-02-20 ENCOUNTER — Ambulatory Visit: Payer: Medicare Other

## 2014-03-07 ENCOUNTER — Ambulatory Visit: Payer: Medicare Other

## 2014-04-30 ENCOUNTER — Ambulatory Visit
Admission: RE | Admit: 2014-04-30 | Discharge: 2014-04-30 | Disposition: A | Discharge status: Home / Self Care | Payer: 59 | Source: Ambulatory Visit

## 2014-04-30 ENCOUNTER — Encounter (INDEPENDENT_AMBULATORY_CARE_PROVIDER_SITE_OTHER): Payer: Self-pay

## 2014-04-30 DIAGNOSIS — Z1231 Encounter for screening mammogram for malignant neoplasm of breast: Secondary | ICD-10-CM

## 2014-07-11 ENCOUNTER — Ambulatory Visit: Payer: Medicare Other | Attending: Family Medicine

## 2014-07-11 DIAGNOSIS — F329 Major depressive disorder, single episode, unspecified: Secondary | ICD-10-CM | POA: Diagnosis not present

## 2014-07-11 DIAGNOSIS — R293 Abnormal posture: Secondary | ICD-10-CM | POA: Insufficient documentation

## 2014-07-11 DIAGNOSIS — M546 Pain in thoracic spine: Secondary | ICD-10-CM | POA: Insufficient documentation

## 2014-07-11 DIAGNOSIS — F3289 Other specified depressive episodes: Secondary | ICD-10-CM | POA: Insufficient documentation

## 2014-07-11 DIAGNOSIS — I10 Essential (primary) hypertension: Secondary | ICD-10-CM | POA: Diagnosis not present

## 2014-07-11 DIAGNOSIS — M629 Disorder of muscle, unspecified: Secondary | ICD-10-CM | POA: Insufficient documentation

## 2014-07-11 DIAGNOSIS — IMO0001 Reserved for inherently not codable concepts without codable children: Secondary | ICD-10-CM | POA: Insufficient documentation

## 2014-07-11 DIAGNOSIS — G43909 Migraine, unspecified, not intractable, without status migrainosus: Secondary | ICD-10-CM | POA: Diagnosis not present

## 2014-07-11 DIAGNOSIS — M2420 Disorder of ligament, unspecified site: Secondary | ICD-10-CM | POA: Diagnosis not present

## 2014-07-16 ENCOUNTER — Ambulatory Visit: Payer: Medicare Other | Admitting: Physical Therapy

## 2014-07-16 DIAGNOSIS — IMO0001 Reserved for inherently not codable concepts without codable children: Secondary | ICD-10-CM | POA: Diagnosis not present

## 2014-07-18 ENCOUNTER — Ambulatory Visit: Payer: Medicare Other | Admitting: Physical Therapy

## 2014-07-18 DIAGNOSIS — IMO0001 Reserved for inherently not codable concepts without codable children: Secondary | ICD-10-CM | POA: Diagnosis not present

## 2014-07-23 ENCOUNTER — Ambulatory Visit: Payer: Medicare Other | Admitting: Physical Therapy

## 2014-07-23 DIAGNOSIS — IMO0001 Reserved for inherently not codable concepts without codable children: Secondary | ICD-10-CM | POA: Diagnosis not present

## 2014-07-25 ENCOUNTER — Ambulatory Visit: Payer: Medicare Other | Admitting: Physical Therapy

## 2014-07-25 DIAGNOSIS — IMO0001 Reserved for inherently not codable concepts without codable children: Secondary | ICD-10-CM | POA: Diagnosis not present

## 2014-07-30 ENCOUNTER — Ambulatory Visit: Payer: Medicare Other | Admitting: Physical Therapy

## 2014-07-30 DIAGNOSIS — IMO0001 Reserved for inherently not codable concepts without codable children: Secondary | ICD-10-CM | POA: Diagnosis not present

## 2014-08-01 ENCOUNTER — Ambulatory Visit: Payer: Medicare Other | Admitting: Physical Therapy

## 2014-08-01 DIAGNOSIS — IMO0001 Reserved for inherently not codable concepts without codable children: Secondary | ICD-10-CM | POA: Diagnosis not present

## 2014-08-06 ENCOUNTER — Encounter: Payer: 59 | Admitting: Physical Therapy

## 2014-12-03 ENCOUNTER — Other Ambulatory Visit: Payer: Self-pay | Admitting: Gastroenterology

## 2015-05-10 ENCOUNTER — Other Ambulatory Visit: Payer: Self-pay

## 2015-05-10 DIAGNOSIS — Z1231 Encounter for screening mammogram for malignant neoplasm of breast: Secondary | ICD-10-CM

## 2015-06-12 ENCOUNTER — Ambulatory Visit: Payer: Self-pay

## 2015-06-21 ENCOUNTER — Ambulatory Visit
Admission: RE | Admit: 2015-06-21 | Discharge: 2015-06-21 | Disposition: A | Discharge status: Home / Self Care | Payer: Medicare Other | Source: Ambulatory Visit

## 2015-06-21 DIAGNOSIS — Z1231 Encounter for screening mammogram for malignant neoplasm of breast: Secondary | ICD-10-CM

## 2016-01-20 DIAGNOSIS — I10 Essential (primary) hypertension: Secondary | ICD-10-CM | POA: Insufficient documentation

## 2016-01-20 DIAGNOSIS — C73 Malignant neoplasm of thyroid gland: Secondary | ICD-10-CM | POA: Insufficient documentation

## 2016-01-20 DIAGNOSIS — E890 Postprocedural hypothyroidism: Secondary | ICD-10-CM | POA: Insufficient documentation

## 2016-03-20 ENCOUNTER — Other Ambulatory Visit: Payer: Self-pay

## 2016-03-20 DIAGNOSIS — Z1231 Encounter for screening mammogram for malignant neoplasm of breast: Secondary | ICD-10-CM

## 2016-06-03 ENCOUNTER — Other Ambulatory Visit: Payer: Self-pay | Admitting: Obstetrics and Gynecology

## 2016-06-03 DIAGNOSIS — Z1231 Encounter for screening mammogram for malignant neoplasm of breast: Secondary | ICD-10-CM

## 2016-06-03 DIAGNOSIS — E2839 Other primary ovarian failure: Secondary | ICD-10-CM

## 2016-06-22 ENCOUNTER — Ambulatory Visit
Admission: RE | Admit: 2016-06-22 | Discharge: 2016-06-22 | Disposition: A | Discharge status: Home / Self Care | Payer: Medicare Other | Source: Ambulatory Visit

## 2016-06-22 ENCOUNTER — Ambulatory Visit
Admission: RE | Admit: 2016-06-22 | Discharge: 2016-06-22 | Disposition: A | Discharge status: Home / Self Care | Payer: Medicare Other | Source: Ambulatory Visit | Attending: Obstetrics and Gynecology | Admitting: Obstetrics and Gynecology

## 2016-06-22 DIAGNOSIS — E2839 Other primary ovarian failure: Secondary | ICD-10-CM

## 2016-06-22 DIAGNOSIS — Z1231 Encounter for screening mammogram for malignant neoplasm of breast: Secondary | ICD-10-CM

## 2017-01-11 ENCOUNTER — Other Ambulatory Visit: Payer: Self-pay | Admitting: Family Medicine

## 2017-01-11 DIAGNOSIS — R51 Headache: Principal | ICD-10-CM

## 2017-01-11 DIAGNOSIS — R519 Headache, unspecified: Secondary | ICD-10-CM

## 2017-01-18 ENCOUNTER — Ambulatory Visit
Admission: RE | Admit: 2017-01-18 | Discharge: 2017-01-18 | Disposition: A | Discharge status: Home / Self Care | Payer: Medicare Other | Source: Ambulatory Visit | Attending: Family Medicine | Admitting: Family Medicine

## 2017-01-18 DIAGNOSIS — R51 Headache: Principal | ICD-10-CM

## 2017-01-18 DIAGNOSIS — R519 Headache, unspecified: Secondary | ICD-10-CM

## 2017-03-31 ENCOUNTER — Ambulatory Visit (INDEPENDENT_AMBULATORY_CARE_PROVIDER_SITE_OTHER): Payer: Medicare Other | Admitting: Podiatry

## 2017-03-31 ENCOUNTER — Ambulatory Visit (INDEPENDENT_AMBULATORY_CARE_PROVIDER_SITE_OTHER): Payer: Medicare Other

## 2017-03-31 DIAGNOSIS — M2042 Other hammer toe(s) (acquired), left foot: Secondary | ICD-10-CM

## 2017-03-31 DIAGNOSIS — N952 Postmenopausal atrophic vaginitis: Secondary | ICD-10-CM | POA: Insufficient documentation

## 2017-03-31 DIAGNOSIS — M2041 Other hammer toe(s) (acquired), right foot: Secondary | ICD-10-CM

## 2017-03-31 DIAGNOSIS — Z780 Asymptomatic menopausal state: Secondary | ICD-10-CM | POA: Insufficient documentation

## 2017-03-31 DIAGNOSIS — S92501A Displaced unspecified fracture of right lesser toe(s), initial encounter for closed fracture: Secondary | ICD-10-CM | POA: Diagnosis not present

## 2017-03-31 NOTE — Progress Notes (Signed)
Subjective:    Patient ID: [name], female   DOB: 76 y.o.   MRN: [id number]   HPI patient presents stating she's concerned that she broke her fourth toe and she wants to make sure there is nothing that can be done and has had long history of foot surgery right foot    Review of Systems  All other systems reviewed and are negative.       Objective:  Physical Exam  Cardiovascular: Intact distal pulses.   Musculoskeletal: Normal range of motion.  Neurological: She is alert.  Skin: Skin is warm and dry.  Nursing note and vitals reviewed.  Neurovascular intact negative Homans sign was noted with patient's fourth toe showing moderate edema in the proximal phalanx localized with good alignment overall but significant deformity secondary to previous surgery performed approximately 10 years ago    Assessment:    Neurovascular status was discussed with patient found to be adequate with fractured fourth digit right that stable with minimal discomfort     Plan:     H&P condition reviewed and discussed that this may or may not heal and if it doesn't heal ultimately may require surgery. At this point we will just watch it and monitor and will be seen back to recheck  X-rays indicate that the fourth toe right has been fractured at the head of the proximal phalanx and is localized

## 2017-03-31 NOTE — Progress Notes (Signed)
   Subjective:    Patient ID: [name], female    DOB: 06/12/1941, 76 y.o.   MRN: [id number]  HPI    Review of Systems  Cardiovascular: Positive for palpitations.  Genitourinary: Positive for frequency.  Neurological: Positive for headaches.       Objective:   Physical Exam        Assessment & Plan:

## 2017-07-14 ENCOUNTER — Other Ambulatory Visit: Payer: Self-pay | Admitting: Obstetrics and Gynecology

## 2017-07-14 DIAGNOSIS — Z1231 Encounter for screening mammogram for malignant neoplasm of breast: Secondary | ICD-10-CM

## 2017-07-23 ENCOUNTER — Ambulatory Visit
Admission: RE | Admit: 2017-07-23 | Discharge: 2017-07-23 | Disposition: A | Discharge status: Home / Self Care | Payer: Medicare Other | Source: Ambulatory Visit | Attending: Obstetrics and Gynecology | Admitting: Obstetrics and Gynecology

## 2017-07-23 DIAGNOSIS — Z1231 Encounter for screening mammogram for malignant neoplasm of breast: Secondary | ICD-10-CM

## 2017-08-09 DIAGNOSIS — G43109 Migraine with aura, not intractable, without status migrainosus: Secondary | ICD-10-CM | POA: Insufficient documentation

## 2017-08-13 ENCOUNTER — Other Ambulatory Visit: Payer: Self-pay | Admitting: Family Medicine

## 2017-08-13 DIAGNOSIS — M419 Scoliosis, unspecified: Secondary | ICD-10-CM

## 2017-08-23 ENCOUNTER — Ambulatory Visit
Admission: RE | Admit: 2017-08-23 | Discharge: 2017-08-23 | Disposition: A | Discharge status: Home / Self Care | Payer: Medicare Other | Source: Ambulatory Visit | Attending: Family Medicine | Admitting: Family Medicine

## 2017-08-23 DIAGNOSIS — M419 Scoliosis, unspecified: Secondary | ICD-10-CM

## 2017-12-23 ENCOUNTER — Other Ambulatory Visit: Payer: Self-pay

## 2018-06-15 ENCOUNTER — Other Ambulatory Visit: Payer: Self-pay | Admitting: Obstetrics & Gynecology

## 2018-06-15 DIAGNOSIS — Z1231 Encounter for screening mammogram for malignant neoplasm of breast: Secondary | ICD-10-CM

## 2018-07-25 ENCOUNTER — Ambulatory Visit
Admission: RE | Admit: 2018-07-25 | Discharge: 2018-07-25 | Disposition: A | Discharge status: Home / Self Care | Payer: Medicare Other | Source: Ambulatory Visit | Attending: Obstetrics & Gynecology | Admitting: Obstetrics & Gynecology

## 2018-07-25 DIAGNOSIS — Z1231 Encounter for screening mammogram for malignant neoplasm of breast: Secondary | ICD-10-CM

## 2018-11-02 HISTORY — PX: OTHER SURGICAL HISTORY: SHX169

## 2019-06-23 ENCOUNTER — Other Ambulatory Visit: Payer: Self-pay | Admitting: Obstetrics & Gynecology

## 2019-06-23 ENCOUNTER — Other Ambulatory Visit: Payer: Self-pay | Admitting: Family Medicine

## 2019-06-23 DIAGNOSIS — Z1231 Encounter for screening mammogram for malignant neoplasm of breast: Secondary | ICD-10-CM

## 2019-06-26 ENCOUNTER — Other Ambulatory Visit: Payer: Self-pay | Admitting: Obstetrics & Gynecology

## 2019-06-26 DIAGNOSIS — E2839 Other primary ovarian failure: Secondary | ICD-10-CM

## 2019-07-11 ENCOUNTER — Other Ambulatory Visit: Payer: Self-pay | Admitting: Podiatry

## 2019-07-11 ENCOUNTER — Ambulatory Visit (INDEPENDENT_AMBULATORY_CARE_PROVIDER_SITE_OTHER): Payer: Medicare Other | Admitting: Podiatry

## 2019-07-11 ENCOUNTER — Ambulatory Visit (INDEPENDENT_AMBULATORY_CARE_PROVIDER_SITE_OTHER): Payer: Medicare Other

## 2019-07-11 ENCOUNTER — Other Ambulatory Visit: Payer: Self-pay

## 2019-07-11 DIAGNOSIS — M79672 Pain in left foot: Secondary | ICD-10-CM

## 2019-07-11 DIAGNOSIS — S90122A Contusion of left lesser toe(s) without damage to nail, initial encounter: Secondary | ICD-10-CM | POA: Diagnosis not present

## 2019-07-11 DIAGNOSIS — M2042 Other hammer toe(s) (acquired), left foot: Secondary | ICD-10-CM | POA: Diagnosis not present

## 2019-07-12 ENCOUNTER — Ambulatory Visit (INDEPENDENT_AMBULATORY_CARE_PROVIDER_SITE_OTHER): Payer: Medicare Other | Admitting: Neurology

## 2019-07-12 ENCOUNTER — Encounter: Payer: Self-pay | Admitting: Neurology

## 2019-07-12 ENCOUNTER — Telehealth: Payer: Self-pay | Admitting: *Deleted

## 2019-07-12 VITALS — BP 164/90 | HR 76 | Temp 97.5°F | Ht 62.0 in | Wt 179.0 lb

## 2019-07-12 DIAGNOSIS — G43711 Chronic migraine without aura, intractable, with status migrainosus: Secondary | ICD-10-CM

## 2019-07-12 MED ORDER — AJOVY 225 MG/1.5ML ~~LOC~~ SOAJ: 225.0000 mg | SUBCUTANEOUS | 11 refills | Status: DC

## 2019-07-12 NOTE — Telephone Encounter (Signed)
PA Ajovy submitted on CMM.  [employer] - PA Case [id number] - Rx #[id number]. Waiting on determination.

## 2019-07-12 NOTE — Progress Notes (Signed)
[id number]    Provider:  Dr Jaynee Eagles Requesting Provider: Simona Huh NP Primary Care Provider:  Vernie Shanks, MD  CC:  Migraines  HPI:  [name] is a 78 y.o. female here as requested by Lawerance Cruel, MD for Migraines. PMHx migraines, HTN, hypothyroidism, HLD, fatigue, HTN, scoliosis, ractive airway syndrome, anxiety. First migraine was with her first child and has been ongoing since then. Terrible migraines, throbbing, pounding, photo/phonophobia, vomiting, nausea. They can be unilateral behind the eye and can switch sides, last 4-5 days and are severe, bed bound, moving hurts. She has neck pain and neck tightness associated. She has daily headaches. She is having thyroid difficulties which is making her migraines worse. She denies aura. She has 15 migraine days in the month. No other focal neurologic deficits, associated symptoms, inciting events or modifiable factors.    Reviewed notes, labs and imaging from outside physicians, which showed:  Tried multiple medications: amitriptyline, amlodipine, topamax, depakote, botox, nerve blocks  CT head 12/2016 personally reviewed images and agree with the following: Mild hypodensities within the left periventricular white matter, likely chronic small vessel disease. No acute intracranial abnormality.  Review of Systems: Patient complains of symptoms per HPI as well as the following symptoms: headache. Pertinent negatives and positives per HPI. All others negative.   Social History   Socioeconomic History  . Marital status: Married    Spouse name: Not on file  . Number of children: Not on file  . Years of education: Not on file  . Highest education level: Not on file  Occupational History  . Not on file  Social Needs  . Financial resource strain: Not on file  . Food insecurity    Worry: Not on file    Inability: Not on file  . Transportation needs    Medical: Not on file    Non-medical: Not on file   Tobacco Use  . Smoking status: Never Smoker  . Smokeless tobacco: Never Used  . Tobacco comment: second hand exposure at work  Substance and Sexual Activity  . Alcohol use: Never    Frequency: Never  . Drug use: Never  . Sexual activity: Not on file  Lifestyle  . Physical activity    Days per week: Not on file    Minutes per session: Not on file  . Stress: Not on file  Relationships  . Social Herbalist on phone: Not on file    Gets together: Not on file    Attends religious service: Not on file    Active member of club or organization: Not on file    Attends meetings of clubs or organizations: Not on file    Relationship status: Not on file  . Intimate partner violence    Fear of current or ex partner: Not on file    Emotionally abused: Not on file    Physically abused: Not on file    Forced sexual activity: Not on file  Other Topics Concern  . Not on file  Social History Narrative   Lives at home with husband   Right handed   Caffeine: 1 cup/day    Family History  Problem Relation Age of Onset  . Headache Mother        occasional  . Headache Sister   . Rheum arthritis Sister   . Headache Paternal Grandmother   . Headache Other   . Headache Niece   . Headache Cousin   .  Headache Maternal Aunt        took many medications   . Stroke Neg Hx     Past Medical History:  Diagnosis Date  . Hashimoto's thyroiditis   . Hypothyroidism   . Migraines   . Thyroid cancer [facility])     Patient Active Problem List   Diagnosis Date Noted  . Chronic migraine without aura, with intractable migraine, so stated, with status migrainosus 07/12/2019  . Atrophic vaginitis 03/31/2017  . Menopause present 03/31/2017  . Benign essential hypertension 01/20/2016  . Papillary adenocarcinoma, follicular variant ([place]) 01/20/2016  . Postoperative hypothyroidism 01/20/2016    Past Surgical History:  Procedure Laterality Date  . APPENDECTOMY    . BREAST EXCISIONAL BIOPSY  Left    benign  . CESAREAN SECTION    . CHOLECYSTECTOMY    . FOOT SURGERY    . THYROIDECTOMY    . uterine fibroid removal      Current Outpatient Medications  Medication Sig Dispense Refill  . amitriptyline (ELAVIL) 10 MG tablet TAKE 1 TABLET BY MOUTH EVERYDAY AT BEDTIME    . amLODipine (NORVASC) 5 MG tablet Take 2.5 mg by mouth as needed.     . ergotamine-caffeine (CAFERGOT) 1-100 MG tablet ergotamine 1 mg-caffeine 100 mg tablet  1/2 TABLET FOR HEADACHE / WOULD NOT USE MORE THAN 1/2 TABLET IN 24 HOURS    . ibuprofen (ADVIL) 200 MG tablet Take by mouth.    . Levothyroxine Sodium (TIROSINT) 125 MCG CAPS Take 1 capsule by mouth daily before breakfast.    . TIROSINT 125 MCG CAPS TAKE 1 CAPSULE BY MOUTH EVERY OTHER DAY    . Fremanezumab-vfrm (AJOVY) 225 MG/1.5ML SOAJ Inject 225 mg into the skin every 30 (thirty) days. 1 pen 11  . Ibuprofen-diphenhydrAMINE Cit 200-38 MG TABS Advil PM 200 mg-38 mg tablet  Take by oral route.     No current facility-administered medications for this visit.     Allergies as of 07/12/2019 - Review Complete 07/12/2019  Allergen Reaction Noted  . Atorvastatin  01/18/2016  . Paxil [paroxetine hcl] Other (See Comments) 03/01/2013  . Rosuvastatin calcium Other (See Comments) 03/01/2013  . Statins  03/01/2013  . Sulfamethoxazole Other (See Comments) 08/09/2017  . Zonisamide Other (See Comments) 03/01/2013    Vitals: BP (!) 164/90 (BP Location: Right Arm, Patient Position: Sitting)   Pulse 76   Temp (!) 97.5 F (36.4 C) Comment: taken at front door  Ht 5\' 2"  (1.575 m)   Wt 179 lb (81.2 kg)   BMI 32.74 kg/m  Last Weight:  Wt Readings from Last 1 Encounters:  07/12/19 179 lb (81.2 kg)   Last Height:   Ht Readings from Last 1 Encounters:  07/12/19 5\' 2"  (1.575 m)     Physical exam: Exam: Gen: NAD, conversant, well nourised, obese, well groomed                     CV: RRR, no MRG. No Carotid Bruits. No peripheral edema, warm, nontender Eyes:  Conjunctivae clear without exudates or hemorrhage  Neuro: Detailed Neurologic Exam  Speech:    Speech is normal; fluent and spontaneous with normal comprehension.  Cognition:    The patient is oriented to person, place, and time;     recent and remote memory intact;     language fluent;     normal attention, concentration,     fund of knowledge Cranial Nerves:    The pupils are equal, round, and reactive to  light. The fundi are normal and spontaneous venous pulsations are present. Visual fields are full to finger confrontation. Extraocular movements are intact. Trigeminal sensation is intact and the muscles of mastication are normal. The face is symmetric. The palate elevates in the midline. Hearing intact. Voice is normal. Shoulder shrug is normal. The tongue has normal motion without fasciculations.   Coordination:    Normal finger to nose and heel to shin. Normal rapid alternating movements.   Gait:    Heel-toe and tandem gait are normal.   Motor Observation:    No asymmetry, no atrophy, and no involuntary movements noted. Tone:    Normal muscle tone.    Posture:    Posture is normal. normal erect    Strength:    Strength is V/V in the upper and lower limbs.      Sensation: intact to LT     Reflex Exam:  DTR's:    Deep tendon reflexes in the upper and lower extremities are normal bilaterally.   Toes:    The toes are downgoing bilaterally.   Clonus:    Clonus is absent.    Assessment/Plan:  78 year old with chronic migraines for decades  Start Ajovy F/u 3 months and we can discuss acute medications, asked her to email me in 4 weeks and let me know how she is doing, may consider sleep eval or MRi brain in future if migraines do not improve or adding in Botox   Meds ordered this encounter  Medications  . Fremanezumab-vfrm (AJOVY) 225 MG/1.5ML SOAJ    Sig: Inject 225 mg into the skin every 30 (thirty) days.    Dispense:  1 pen    Refill:  11   Discussed: To  prevent or relieve headaches, try the following: Cool Compress. Lie down and place a cool compress on your head.  Avoid headache triggers. If certain foods or odors seem to have triggered your migraines in the past, avoid them. A headache diary might help you identify triggers.  Include physical activity in your daily routine. Try a daily walk or other moderate aerobic exercise.  Manage stress. Find healthy ways to cope with the stressors, such as delegating tasks on your to-do list.  Practice relaxation techniques. Try deep breathing, yoga, massage and visualization.  Eat regularly. Eating regularly scheduled meals and maintaining a healthy diet might help prevent headaches. Also, drink plenty of fluids.  Follow a regular sleep schedule. Sleep deprivation might contribute to headaches Consider biofeedback. With this mind-body technique, you learn to control certain bodily functions - such as muscle tension, heart rate and blood pressure - to prevent headaches or reduce headache pain.    Proceed to emergency room if you experience new or worsening symptoms or symptoms do not resolve, if you have new neurologic symptoms or if headache is severe, or for any concerning symptom.   Provided education and documentation from American headache Society toolbox including articles on: chronic migraine medication overuse headache, chronic migraines, prevention of migraines, behavioral and other nonpharmacologic treatments for headache.  A total of 45 minutes was spent face-to-face with this patient. Over half this time was spent on counseling patient on the  1. Chronic migraine without aura, with intractable migraine, so stated, with status migrainosus    diagnosis and different diagnostic and therapeutic options, counseling and coordination of care, risks ans benefits of management, compliance, or risk factor reduction and education.     Cc:  Simona Huh NP  Vernie Shanks, MD  Sarina Ill, MD   [facility] [address] [place], [place] [address]  Phone [phone] Fax [phone] ++

## 2019-07-12 NOTE — Patient Instructions (Signed)
Ajovy monthly  Fremanezumab injection What is this medicine? FREMANEZUMAB (fre ma NEZ ue mab) is used to prevent migraine headaches. This medicine may be used for other purposes; ask your health care provider or pharmacist if you have questions. COMMON BRAND NAME(S): AJOVY What should I tell my health care provider before I take this medicine? They need to know if you have any of these conditions:  an unusual or allergic reaction to fremanezumab, other medicines, foods, dyes, or preservatives  pregnant or trying to get pregnant  breast-feeding How should I use this medicine? This medicine is for injection under the skin. You will be taught how to prepare and give this medicine. Use exactly as directed. Take your medicine at regular intervals. Do not take your medicine more often than directed. It is important that you put your used needles and syringes in a special sharps container. Do not put them in a trash can. If you do not have a sharps container, call your pharmacist or healthcare provider to get one. Talk to your pediatrician regarding the use of this medicine in children. Special care may be needed. Overdosage: If you think you have taken too much of this medicine contact a poison control center or emergency room at once. NOTE: This medicine is only for you. Do not share this medicine with others. What if I miss a dose? If you miss a dose, take it as soon as you can. If it is almost time for your next dose, take only that dose. Do not take double or extra doses. What may interact with this medicine? Interactions are not expected. This list may not describe all possible interactions. Give your health care provider a list of all the medicines, herbs, non-prescription drugs, or dietary supplements you use. Also tell them if you smoke, drink alcohol, or use illegal drugs. Some items may interact with your medicine. What should I watch for while using this medicine? Tell your doctor or  healthcare professional if your symptoms do not start to get better or if they get worse. What side effects may I notice from receiving this medicine? Side effects that you should report to your doctor or health care professional as soon as possible:  allergic reactions like skin rash, itching or hives, swelling of the face, lips, or tongue Side effects that usually do not require medical attention (report these to your doctor or health care professional if they continue or are bothersome):  pain, redness, or irritation at site where injected This list may not describe all possible side effects. Call your doctor for medical advice about side effects. You may report side effects to FDA at 1-800-FDA-1088. Where should I keep my medicine? Keep out of the reach of children. You will be instructed on how to store this medicine. Throw away any unused medicine after the expiration date on the label. NOTE: This sheet is a summary. It may not cover all possible information. If you have questions about this medicine, talk to your doctor, pharmacist, or health care provider.  2020 Elsevier/Gold Standard (2017-07-19 17:22:56)

## 2019-07-13 NOTE — Telephone Encounter (Signed)
Received fax notification from [facility] that PA Ajovy denied. Pt must try/fail Aimovig or Emgality first. Pt has [employer] medicare and would no qualify for copay card for Ajovy. I will send message to MD to see if she would like to switch to formulary option.

## 2019-07-13 NOTE — Telephone Encounter (Signed)
Yes please call her and see if she is willing to try Emgality thanks

## 2019-07-13 NOTE — Telephone Encounter (Signed)
Called, LVM for pt (ok per DPR) asking she call office back. Wanting to make sure she is okay to switch from Ajovy to [place] since her insurance will not cover Ajovy.

## 2019-07-15 NOTE — Progress Notes (Signed)
Subjective:   Patient ID: [name], female   DOB: 78 y.o.   MRN: [id number]   HPI Patient states having a lot of problems with my left toe and I just wanted to have it checked.  Points to second digit left foot   ROS      Objective:  Physical Exam  Neurovascular status intact negative Homans sign was noted with patient found to have edema in the digit that is localized with no redness no proximal edema erythema or drainage note     Assessment:  Probability that this is trauma with possibility of infective or inflammatory systemic process      Plan:  H&P x-ray reviewed all conditions discussed and advised on soaks wider shoes cushioning and discussed any changes I want to see her immediately  X-rays were negative for signs of fracture appears to be soft tissue injury

## 2019-07-17 NOTE — Telephone Encounter (Signed)
FYI--Pt called office back. Took call from phone staff. She has two more samples of Ajovy left and would like to use this up first before switching to [employer]. She is aware insurance will not cover Ajovy. She will call office back and let us know when she has 1 Ajovy pen left and we will then send in rx emgality and cx rx ajovy. She is aware PA needed for emgality but is on formulary list.

## 2019-07-17 NOTE — Telephone Encounter (Signed)
Noted thanks °

## 2019-07-18 NOTE — Telephone Encounter (Signed)
Please thanks.  

## 2019-07-18 NOTE — Telephone Encounter (Signed)
Pt called stating that her insurance company informed her that if the provider puts in the appeal for the Ajovy and tells them that it is the medication that she wants the pt to take, they will approve it. PA dept # for [facility] was given as [phone] Please advise.

## 2019-07-28 ENCOUNTER — Other Ambulatory Visit: Payer: Self-pay

## 2019-07-28 ENCOUNTER — Ambulatory Visit (INDEPENDENT_AMBULATORY_CARE_PROVIDER_SITE_OTHER): Payer: Medicare Other | Admitting: Podiatry

## 2019-07-28 ENCOUNTER — Encounter: Payer: Self-pay | Admitting: Podiatry

## 2019-07-28 DIAGNOSIS — S90211A Contusion of right great toe with damage to nail, initial encounter: Secondary | ICD-10-CM | POA: Diagnosis not present

## 2019-07-28 DIAGNOSIS — L03031 Cellulitis of right toe: Secondary | ICD-10-CM | POA: Diagnosis not present

## 2019-07-28 MED ORDER — DOXYCYCLINE HYCLATE 100 MG PO TABS: 100.0000 mg | ORAL_TABLET | Freq: Two times a day (BID) | ORAL | 0 refills | Status: DC

## 2019-07-28 NOTE — Patient Instructions (Signed)

## 2019-07-28 NOTE — Progress Notes (Signed)
Subjective:  Patient ID: [name], female    DOB: [birth year],  MRN: [id number]  Chief Complaint  Patient presents with  . Nail Problem    Hallux right - nail injury on Monday (5 days) bumped it and nail is now loose along the medial side, keeping covered with bandaid    78 y.o. female presents with the above complaint. Agree with CC above.    Review of Systems: Negative except as noted in the HPI. Denies N/V/F/Ch.  Past Medical History:  Diagnosis Date  . Hashimoto's thyroiditis   . Hypothyroidism   . Migraines   . Thyroid cancer ([place])     Current Outpatient Medications:  .  amitriptyline (ELAVIL) 10 MG tablet, TAKE 1 TABLET BY MOUTH EVERYDAY AT BEDTIME, Disp: , Rfl:  .  amLODipine (NORVASC) 5 MG tablet, Take 2.5 mg by mouth as needed. , Disp: , Rfl:  .  BESIVANCE 0.6 % SUSP, APPLY 1 DROP INTO RIGHT EYE THREE TIMES A DAY AS DIRECTED, Disp: , Rfl:  .  doxycycline (VIBRA-TABS) 100 MG tablet, Take 1 tablet (100 mg total) by mouth 2 (two) times daily., Disp: 14 tablet, Rfl: 0 .  DUREZOL 0.05 % EMUL, APPLY 1 DROP INTO RIGHT EYE THREE TIMES A DAY AS DIRECTED, Disp: , Rfl:  .  ergotamine-caffeine (CAFERGOT) 1-100 MG tablet, ergotamine 1 mg-caffeine 100 mg tablet  1/2 TABLET FOR HEADACHE / WOULD NOT USE MORE THAN 1/2 TABLET IN 24 HOURS, Disp: , Rfl:  .  Fremanezumab-vfrm (AJOVY) 225 MG/1.5ML SOAJ, Inject 225 mg into the skin every 30 (thirty) days., Disp: 1 pen, Rfl: 11 .  ibuprofen (ADVIL) 200 MG tablet, Take by mouth., Disp: , Rfl:  .  Ibuprofen-diphenhydrAMINE Cit 200-38 MG TABS, Advil PM 200 mg-38 mg tablet  Take by oral route., Disp: , Rfl:  .  Levothyroxine Sodium (TIROSINT) 125 MCG CAPS, Take 1 capsule by mouth daily before breakfast., Disp: , Rfl:  .  PROLENSA 0.07 % SOLN, APPLY 1 DROP INTO RIGHT EYE AT BEDTIME AS DIRECTED, Disp: , Rfl:  .  TIROSINT 125 MCG CAPS, TAKE 1 CAPSULE BY MOUTH EVERY OTHER DAY, Disp: , Rfl:   Social History   Tobacco Use  Smoking Status Never  Smoker  Smokeless Tobacco Never Used  Tobacco Comment   second hand exposure at work    Allergies  Allergen Reactions  . Atorvastatin     Other reaction(s): Myalgias (muscle pain) Other reaction(s): Myalgias (intolerance)  . Paxil [Paroxetine Hcl] Other (See Comments)    syncope  . Rosuvastatin Calcium Other (See Comments)  . Statins   . Sulfamethoxazole Other (See Comments)  . Zonisamide Other (See Comments)   Objective:  There were no vitals filed for this visit. There is no height or weight on file to calculate BMI. Constitutional Well developed. Well nourished.  Vascular Dorsalis pedis pulses palpable bilaterally. Posterior tibial pulses palpable bilaterally. Capillary refill normal to all digits.  No cyanosis or clubbing noted. Pedal hair growth normal.  Neurologic Normal speech. Oriented to person, place, and time. Epicritic sensation to light touch grossly present bilaterally.  Dermatologic Painful nail contusion with loosening of the entire nail borders of the hallux nail right. Mild damage to nail bed without laceration. Hemostasis controlled.  No other open wounds. No skin lesions.  Orthopedic: Normal joint ROM without pain or crepitus bilaterally. No visible deformities. No bony tenderness.   Radiographs: None Assessment:   1. Contusion of right great toe with damage to nail, initial  encounter   2. Paronychia of toe, right    Plan:  Patient was evaluated and treated and all questions answered.  Nail Contusion, right -Patient elects to proceed with minor surgery to remove entire toenail removal today. Consent reviewed and signed by patient. -Total nail excised See procedure note. -Educated on post-procedure care including soaking. Written instructions provided and reviewed. -Patient to follow up in 2 weeks for nail check. -Doxycycline sent to pharmacy for ppx  Procedure: Excision of Entire (total)Toenail Location: Right 1st toe total nail borders.  Anesthesia: Lidocaine 1% plain; 1.5 mL and Marcaine 0.5% plain; 1.5 mL, digital block. Skin Prep: Betadine. Dressing: Silvadene; telfa; dry, sterile, compression dressing. Technique: Following skin prep, the toe was exsanguinated and a tourniquet was secured at the base of the toe. The affected nail border was freed, split with a nail splitter, and excised. Chemical matrixectomy was then performed with phenol and irrigated out with alcohol. The tourniquet was then removed and sterile dressing applied. Disposition: Patient tolerated procedure well. Patient to return in 2 weeks for follow-up.   Return in about 2 weeks (around 08/11/2019).

## 2019-07-31 ENCOUNTER — Encounter: Payer: Self-pay | Admitting: Podiatry

## 2019-08-03 ENCOUNTER — Encounter: Payer: Self-pay | Admitting: *Deleted

## 2019-08-03 NOTE — Telephone Encounter (Signed)
Appeal signed and faxed to [facility]. Received a receipt of confirmation.

## 2019-08-03 NOTE — Telephone Encounter (Addendum)
Appeal letter written & office note printed. Ready for MD signature then will be faxed to [facility] appeals. I called [employer], spoke with representative Truman Hayward, and obtained the fax number to send appeal to: [phone].

## 2019-08-04 ENCOUNTER — Telehealth: Payer: Self-pay | Admitting: Neurology

## 2019-08-04 NOTE — Telephone Encounter (Signed)
Santiago Glad from [facility] called needing to speak to RN about the pt's Ajovy  Please advise.

## 2019-08-07 NOTE — Telephone Encounter (Signed)
I called [name] @ [employer] back. LVM with office number asking for call back. Advised here till 5 pm.

## 2019-08-07 NOTE — Telephone Encounter (Signed)
I spoke with the pt today regarding the approval. She was advised when she runs out of samples to contact the pharmacy 2 weeks in advance of next dose so they can order if needed. I advised I would send a message to the pharmacy. Her questions were all answered. She verbalized appreciation.   I faxed the approval letter to [facility] on [address]. Received a receipt of confirmation.

## 2019-08-07 NOTE — Telephone Encounter (Signed)
We received additional questions for Ajovy appeal. Questions answered, signed by Dr. Jaynee Eagles and faxed back to [place] @ [facility] [phone]. Received a receipt of confirmation.

## 2019-08-07 NOTE — Telephone Encounter (Signed)
We received an approval of Ajovy from [facility]. Case number: [id number]. Authorization number [id number]. Authorized from 07/12/2019 through November 01, 2020.

## 2019-08-10 ENCOUNTER — Ambulatory Visit: Payer: Medicare Other

## 2019-08-11 ENCOUNTER — Ambulatory Visit: Payer: Medicare Other | Admitting: Podiatry

## 2019-09-07 ENCOUNTER — Other Ambulatory Visit: Payer: Self-pay

## 2019-09-07 ENCOUNTER — Ambulatory Visit
Admission: RE | Admit: 2019-09-07 | Discharge: 2019-09-07 | Disposition: A | Discharge status: Home / Self Care | Payer: Medicare Other | Source: Ambulatory Visit | Attending: Family Medicine | Admitting: Family Medicine

## 2019-09-07 ENCOUNTER — Ambulatory Visit
Admission: RE | Admit: 2019-09-07 | Discharge: 2019-09-07 | Disposition: A | Discharge status: Home / Self Care | Payer: Medicare Other | Source: Ambulatory Visit | Attending: Obstetrics & Gynecology | Admitting: Obstetrics & Gynecology

## 2019-09-07 DIAGNOSIS — Z1231 Encounter for screening mammogram for malignant neoplasm of breast: Secondary | ICD-10-CM

## 2019-09-07 DIAGNOSIS — E2839 Other primary ovarian failure: Secondary | ICD-10-CM

## 2019-10-03 NOTE — Progress Notes (Signed)
[id number]    Provider:  Dr Jaynee Eagles Requesting Provider: Simona Huh NP Primary Care Provider:  Vernie Shanks, MD  CC:  Migraines  Interval history 10/04/2019: She has had 4 Ajovy injections. She felt good initially. But then she does not feel like it has helped that much. She has also had cataract surgery and some other events. Bright light triggers headaches. Roselyn Meier did not work. We discussed Lsmiditan. Caffergot helped. Botox helped and worked but she says she doesn't want to again, she can't take all the injections.  HPI:  [name] is a 78 y.o. female here as requested by Vernie Shanks, MD for Migraines. PMHx migraines, HTN, hypothyroidism, HLD, fatigue, HTN, scoliosis, ractive airway syndrome, anxiety. First migraine was with her first child and has been ongoing since then. Terrible migraines, throbbing, pounding, photo/phonophobia, vomiting, nausea. They can be unilateral behind the eye and can switch sides, last 4-5 days and are severe, bed bound, moving hurts. She has neck pain and neck tightness associated. She has daily headaches. She is having thyroid difficulties which is making her migraines worse. She denies aura. She has 15 migraine days in the month. No other focal neurologic deficits, associated symptoms, inciting events or modifiable factors.    Reviewed notes, labs and imaging from outside physicians, which showed:  Tried multiple medications: amitriptyline, amlodipine, topamax, depakote, botox, nerve blocks  CT head 12/2016 personally reviewed images and agree with the following: Mild hypodensities within the left periventricular white matter, likely chronic small vessel disease. No acute intracranial abnormality.  Review of Systems: Patient complains of symptoms per HPI as well as the following symptoms: headache. Pertinent negatives and positives per HPI. All others negative.   Social History   Socioeconomic History  . Marital status:  Married    Spouse name: Not on file  . Number of children: Not on file  . Years of education: Not on file  . Highest education level: Not on file  Occupational History  . Not on file  Social Needs  . Financial resource strain: Not on file  . Food insecurity    Worry: Not on file    Inability: Not on file  . Transportation needs    Medical: Not on file    Non-medical: Not on file  Tobacco Use  . Smoking status: Never Smoker  . Smokeless tobacco: Never Used  . Tobacco comment: second hand exposure at work  Substance and Sexual Activity  . Alcohol use: Never    Frequency: Never  . Drug use: Never  . Sexual activity: Not on file  Lifestyle  . Physical activity    Days per week: Not on file    Minutes per session: Not on file  . Stress: Not on file  Relationships  . Social Herbalist on phone: Not on file    Gets together: Not on file    Attends religious service: Not on file    Active member of club or organization: Not on file    Attends meetings of clubs or organizations: Not on file    Relationship status: Not on file  . Intimate partner violence    Fear of current or ex partner: Not on file    Emotionally abused: Not on file    Physically abused: Not on file    Forced sexual activity: Not on file  Other Topics Concern  . Not on file  Social History Narrative   Lives at home with  husband   Right handed   Caffeine: 1 cup some days    Family History  Problem Relation Age of Onset  . Headache Mother        occasional  . Headache Sister   . Rheum arthritis Sister   . Headache Paternal Grandmother   . Headache Other   . Headache Niece   . Headache Cousin   . Headache Maternal Aunt        took many medications   . Stroke Neg Hx     Past Medical History:  Diagnosis Date  . Hashimoto's thyroiditis   . Hypothyroidism   . Migraines   . Thyroid cancer [facility])     Patient Active Problem List   Diagnosis Date Noted  . Chronic migraine without aura,  with intractable migraine, so stated, with status migrainosus 07/12/2019  . Atrophic vaginitis 03/31/2017  . Menopause present 03/31/2017  . Benign essential hypertension 01/20/2016  . Papillary adenocarcinoma, follicular variant ([place]) 01/20/2016  . Postoperative hypothyroidism 01/20/2016    Past Surgical History:  Procedure Laterality Date  . APPENDECTOMY    . BREAST EXCISIONAL BIOPSY Left    benign  . cataract surgery Bilateral 2020  . CESAREAN SECTION    . CHOLECYSTECTOMY    . FOOT SURGERY    . THYROIDECTOMY    . uterine fibroid removal      Current Outpatient Medications  Medication Sig Dispense Refill  . amitriptyline (ELAVIL) 10 MG tablet TAKE 1 TABLET BY MOUTH EVERYDAY AT BEDTIME    . amLODipine (NORVASC) 5 MG tablet Take 2.5 mg by mouth as needed.     . ergotamine-caffeine (CAFERGOT) 1-100 MG tablet ergotamine 1 mg-caffeine 100 mg tablet  1/2 TABLET FOR HEADACHE / WOULD NOT USE MORE THAN 1/2 TABLET IN 24 HOURS    . Fremanezumab-vfrm (AJOVY) 225 MG/1.5ML SOAJ Inject 225 mg into the skin every 30 (thirty) days. 1 pen 11  . ibuprofen (ADVIL) 200 MG tablet Take by mouth.    . Ibuprofen-diphenhydrAMINE Cit 200-38 MG TABS Advil PM 200 mg-38 mg tablet  Take by oral route.    Marland Kitchen PROLENSA 0.07 % SOLN APPLY 1 DROP INTO RIGHT EYE AT BEDTIME AS DIRECTED    . TIROSINT 125 MCG CAPS TAKE 1 CAPSULE BY MOUTH EVERY OTHER DAY    . Galcanezumab-gnlm (EMGALITY) 120 MG/ML SOAJ Inject 120 mg into the skin every 30 (thirty) days. 1 pen 11   No current facility-administered medications for this visit.     Allergies as of 10/04/2019 - Review Complete 10/04/2019  Allergen Reaction Noted  . Atorvastatin  01/18/2016  . Paxil [paroxetine hcl] Other (See Comments) 03/01/2013  . Rosuvastatin calcium Other (See Comments) 03/01/2013  . Statins  03/01/2013  . Sulfamethoxazole Other (See Comments) 08/09/2017  . Zonisamide Other (See Comments) 03/01/2013    Vitals: BP (!) 152/92 (BP Location:  Right Arm, Patient Position: Sitting)   Pulse 96   Temp (!) 97.1 F (36.2 C) Comment: taken at front  Ht 5\' 2"  (1.575 m)   Wt 178 lb (80.7 kg)   BMI 32.56 kg/m  Last Weight:  Wt Readings from Last 1 Encounters:  10/04/19 178 lb (80.7 kg)   Last Height:   Ht Readings from Last 1 Encounters:  10/04/19 5\' 2"  (1.575 m)     Physical exam: Exam: Gen: NAD, conversant, well nourised, obese, well groomed  CV: RRR, no MRG. No Carotid Bruits. No peripheral edema, warm, nontender Eyes: Conjunctivae clear without exudates or hemorrhage  Neuro: Detailed Neurologic Exam  Speech:    Speech is normal; fluent and spontaneous with normal comprehension.  Cognition:    The patient is oriented to person, place, and time;     recent and remote memory intact;     language fluent;     normal attention, concentration,     fund of knowledge Cranial Nerves:    The pupils are equal, round, and reactive to light. The fundi are normal and spontaneous venous pulsations are present. Visual fields are full to finger confrontation. Extraocular movements are intact. Trigeminal sensation is intact and the muscles of mastication are normal. The face is symmetric. The palate elevates in the midline. Hearing intact. Voice is normal. Shoulder shrug is normal. The tongue has normal motion without fasciculations.   Coordination:    Normal finger to nose and heel to shin. Normal rapid alternating movements.   Gait:    Heel-toe and tandem gait are normal.   Motor Observation:    No asymmetry, no atrophy, and no involuntary movements noted. Tone:    Normal muscle tone.    Posture:    Posture is normal. normal erect    Strength:    Strength is V/V in the upper and lower limbs.      Sensation: intact to LT     Reflex Exam:  DTR's:    Deep tendon reflexes in the upper and lower extremities are normal bilaterally.   Toes:    The toes are downgoing bilaterally.   Clonus:    Clonus is  absent.    Assessment/Plan:  78 year old with chronic migraines for decades  Tried multiple medications: amitriptyline, amlodipine, topamax, depakote, botox(helped a lot but she can't take the injection pain), nerve blocks  Failed Ajovy, will try erenumab. If doesn't help, will send for Vyepti infusions. Start Botox - I will use samples tomorrow and I convinced her to try again  She as had MRI brain in the past, CT in 2018 was unremarkable ( Mild hypodensities within the left periventricular white matter, likely chronic small vessel disease.)  Meds ordered this encounter  Medications  . Galcanezumab-gnlm (EMGALITY) 120 MG/ML SOAJ    Sig: Inject 120 mg into the skin every 30 (thirty) days.    Dispense:  1 pen    Refill:  11   Discussed: To prevent or relieve headaches, try the following: Cool Compress. Lie down and place a cool compress on your head.  Avoid headache triggers. If certain foods or odors seem to have triggered your migraines in the past, avoid them. A headache diary might help you identify triggers.  Include physical activity in your daily routine. Try a daily walk or other moderate aerobic exercise.  Manage stress. Find healthy ways to cope with the stressors, such as delegating tasks on your to-do list.  Practice relaxation techniques. Try deep breathing, yoga, massage and visualization.  Eat regularly. Eating regularly scheduled meals and maintaining a healthy diet might help prevent headaches. Also, drink plenty of fluids.  Follow a regular sleep schedule. Sleep deprivation might contribute to headaches Consider biofeedback. With this mind-body technique, you learn to control certain bodily functions - such as muscle tension, heart rate and blood pressure - to prevent headaches or reduce headache pain.    Proceed to emergency room if you experience new or worsening symptoms or symptoms do not resolve, if  you have new neurologic symptoms or if headache is severe, or  for any concerning symptom.   Provided education and documentation from American headache Society toolbox including articles on: chronic migraine medication overuse headache, chronic migraines, prevention of migraines, behavioral and other nonpharmacologic treatments for headache.   A total of 30  minutes was spent face-to-face with this patient. Over half this time was spent on counseling patient on the  1. Chronic migraine without aura, with intractable migraine, so stated, with status migrainosus    diagnosis and different diagnostic and therapeutic options, counseling and coordination of care, risks ans benefits of management, compliance, or risk factor reduction and education.      Cc:  Simona Huh NP  Vernie Shanks, MD  Sarina Ill, MD  [facility] [address] [place], [place] [address]  Phone [phone] Fax [phone] ++

## 2019-10-04 ENCOUNTER — Encounter: Payer: Self-pay | Admitting: Neurology

## 2019-10-04 ENCOUNTER — Other Ambulatory Visit: Payer: Self-pay

## 2019-10-04 ENCOUNTER — Ambulatory Visit: Payer: Medicare Other | Admitting: Neurology

## 2019-10-04 VITALS — BP 152/92 | HR 96 | Temp 97.1°F | Ht 62.0 in | Wt 178.0 lb

## 2019-10-04 DIAGNOSIS — G43711 Chronic migraine without aura, intractable, with status migrainosus: Secondary | ICD-10-CM | POA: Diagnosis not present

## 2019-10-04 MED ORDER — EMGALITY 120 MG/ML ~~LOC~~ SOAJ: 120.0000 mg | SUBCUTANEOUS | 11 refills | Status: DC

## 2019-10-04 NOTE — Patient Instructions (Signed)
Come back tomorrow for botox Start Emgality monthly Stop Ajovy  Galcanezumab injection What is this medicine? GALCANEZUMAB (gal ka NEZ ue mab) is used to prevent migraines and treat cluster headaches. This medicine may be used for other purposes; ask your health care provider or pharmacist if you have questions. COMMON BRAND NAME(S): Emgality What should I tell my health care provider before I take this medicine? They need to know if you have any of these conditions:  an unusual or allergic reaction to galcanezumab, other medicines, foods, dyes, or preservatives  pregnant or trying to get pregnant  breast-feeding How should I use this medicine? This medicine is for injection under the skin. You will be taught how to prepare and give this medicine. Use exactly as directed. Take your medicine at regular intervals. Do not take your medicine more often than directed. It is important that you put your used needles and syringes in a special sharps container. Do not put them in a trash can. If you do not have a sharps container, call your pharmacist or healthcare provider to get one. Talk to your pediatrician regarding the use of this medicine in children. Special care may be needed. Overdosage: If you think you have taken too much of this medicine contact a poison control center or emergency room at once. NOTE: This medicine is only for you. Do not share this medicine with others. What if I miss a dose? If you miss a dose, take it as soon as you can. If it is almost time for your next dose, take only that dose. Do not take double or extra doses. What may interact with this medicine? Interactions are not expected. This list may not describe all possible interactions. Give your health care provider a list of all the medicines, herbs, non-prescription drugs, or dietary supplements you use. Also tell them if you smoke, drink alcohol, or use illegal drugs. Some items may interact with your  medicine. What should I watch for while using this medicine? Tell your doctor or healthcare professional if your symptoms do not start to get better or if they get worse. What side effects may I notice from receiving this medicine? Side effects that you should report to your doctor or health care professional as soon as possible:  allergic reactions like skin rash, itching or hives, swelling of the face, lips, or tongue Side effects that usually do not require medical attention (report these to your doctor or health care professional if they continue or are bothersome):  pain, redness, or irritation at site where injected This list may not describe all possible side effects. Call your doctor for medical advice about side effects. You may report side effects to FDA at 1-800-FDA-1088. Where should I keep my medicine? Keep out of the reach of children. You will be instructed on how to store this medicine. Throw away any unused medicine after the expiration date on the label. NOTE: This sheet is a summary. It may not cover all possible information. If you have questions about this medicine, talk to your doctor, pharmacist, or health care provider.  2020 Elsevier/Gold Standard (2018-04-06 12:03:23)

## 2019-10-05 ENCOUNTER — Telehealth: Payer: Self-pay | Admitting: *Deleted

## 2019-10-05 ENCOUNTER — Other Ambulatory Visit: Payer: Self-pay

## 2019-10-05 ENCOUNTER — Ambulatory Visit: Payer: Medicare Other | Admitting: Neurology

## 2019-10-05 VITALS — Temp 97.1°F

## 2019-10-05 DIAGNOSIS — G43711 Chronic migraine without aura, intractable, with status migrainosus: Secondary | ICD-10-CM | POA: Diagnosis not present

## 2019-10-05 NOTE — Telephone Encounter (Signed)
Received [employer] approval from [employer]. Approved through 11/01/2020. I faxed this to the pt's pharmacy. Received a receipt of confirmation.

## 2019-10-05 NOTE — Progress Notes (Signed)
botox consent signed Botox- 100 units x 2 vials Lot: [id number] Expiration: 07/2021 NDC: [id number]  Bacteriostatic 0.9% Sodium Chloride- 53mL total Lot: [id number] Expiration: 02/01/2020 NDC: [id number]  Dx: [id number] Samples

## 2019-10-05 NOTE — Telephone Encounter (Signed)
Emgality PA completed on CMM. Key: [id number]. Awaiting [facility] part D determination.

## 2019-10-07 ENCOUNTER — Telehealth: Payer: Self-pay | Admitting: Neurology

## 2019-10-07 ENCOUNTER — Encounter: Payer: Self-pay | Admitting: Neurology

## 2019-10-07 NOTE — Progress Notes (Signed)
A total of 25 minutes was spent face-to-face with this patient. Over half this time was spent on counseling patient on the  1. Chronic migraine without aura, with intractable migraine, so stated, with status migrainosus    diagnosis and different diagnostic and therapeutic options, counseling and coordination of care, risks ans benefits of management, compliance, or risk factor reduction and education.     Consent Form Botulism Toxin Injection For Chronic Migraine    Reviewed orally with patient, additionally signature is on file:  Botulism toxin has been approved by the Federal drug administration for treatment of chronic migraine. Botulism toxin does not cure chronic migraine and it may not be effective in some patients.  The administration of botulism toxin is accomplished by injecting a small amount of toxin into the muscles of the neck and head. Dosage must be titrated for each individual. Any benefits resulting from botulism toxin tend to wear off after 3 months with a repeat injection required if benefit is to be maintained. Injections are usually done every 3-4 months with maximum effect peak achieved by about 2 or 3 weeks. Botulism toxin is expensive and you should be sure of what costs you will incur resulting from the injection.  The side effects of botulism toxin use for chronic migraine may include:   -Transient, and usually mild, facial weakness with facial injections  -Transient, and usually mild, head or neck weakness with head/neck injections  -Reduction or loss of forehead facial animation due to forehead muscle weakness  -Eyelid drooping  -Dry eye  -Pain at the site of injection or bruising at the site of injection  -Double vision  -Potential unknown long term risks  Contraindications: You should not have Botox if you are pregnant, nursing, allergic to albumin, have an infection, skin condition, or muscle weakness at the site of the injection, or have myasthenia gravis,  Lambert-Eaton syndrome, or ALS.  It is also possible that as with any injection, there may be an allergic reaction or no effect from the medication. Reduced effectiveness after repeated injections is sometimes seen and rarely infection at the injection site may occur. All care will be taken to prevent these side effects. If therapy is given over a long time, atrophy and wasting in the muscle injected may occur. Occasionally the patient's become refractory to treatment because they develop antibodies to the toxin. In this event, therapy needs to be modified.  I have read the above information and consent to the administration of botulism toxin.    BOTOX PROCEDURE NOTE FOR MIGRAINE HEADACHE    Contraindications and precautions discussed with patient(above). Aseptic procedure was observed and patient tolerated procedure. Procedure performed by Dr. Georgia Dom  The condition has existed for more than 6 months, and pt does not have a diagnosis of ALS, Myasthenia Gravis or Lambert-Eaton Syndrome.  Risks and benefits of injections discussed and pt agrees to proceed with the procedure.  Written consent obtained  These injections are medically necessary. Pt  receives good benefits from these injections. These injections do not cause sedations or hallucinations which the oral therapies may cause.  Description of procedure:  The patient was placed in a sitting position. The standard protocol was used for Botox as follows, with 5 units of Botox injected at each site:   -Procerus muscle, midline injection  -Corrugator muscle, bilateral injection  -Frontalis muscle, bilateral injection, with 2 sites each side, medial injection was performed in the upper one third of the frontalis muscle, in the region  vertical from the medial inferior edge of the superior orbital rim. The lateral injection was again in the upper one third of the forehead vertically above the lateral limbus of the cornea, 1.5 cm lateral  to the medial injection site.  -Temporalis muscle injection, 4 sites, bilaterally. The first injection was 3 cm above the tragus of the ear, second injection site was 1.5 cm to 3 cm up from the first injection site in line with the tragus of the ear. The third injection site was 1.5-3 cm forward between the first 2 injection sites. The fourth injection site was 1.5 cm posterior to the second injection site.   -Occipitalis muscle injection, 3 sites, bilaterally. The first injection was done one half way between the occipital protuberance and the tip of the mastoid process behind the ear. The second injection site was done lateral and superior to the first, 1 fingerbreadth from the first injection. The third injection site was 1 fingerbreadth superiorly and medially from the first injection site.  -Cervical paraspinal muscle injection, 2 sites, bilateral knee first injection site was 1 cm from the midline of the cervical spine, 3 cm inferior to the lower border of the occipital protuberance. The second injection site was 1.5 cm superiorly and laterally to the first injection site.  -Trapezius muscle injection was performed at 3 sites, bilaterally. The first injection site was in the upper trapezius muscle halfway between the inflection point of the neck, and the acromion. The second injection site was one half way between the acromion and the first injection site. The third injection was done between the first injection site and the inflection point of the neck.   Will return for repeat injection in 3 months.   200 units of Botox was used, any Botox not injected was wasted. The patient tolerated the procedure well, there were no complications of the above procedure.

## 2019-10-07 NOTE — Telephone Encounter (Signed)
[name], will you set up patient for botox for migraine please and call her to inform her of the process. thanks

## 2019-10-09 NOTE — Telephone Encounter (Signed)
Botox charge sheet ready for Dr. Jaynee Eagles signature. Dx: [id number].

## 2019-10-09 NOTE — Telephone Encounter (Signed)
Patient was scheduled by check out. DW

## 2019-12-12 ENCOUNTER — Ambulatory Visit (INDEPENDENT_AMBULATORY_CARE_PROVIDER_SITE_OTHER): Payer: Medicare PPO | Admitting: Neurology

## 2019-12-12 ENCOUNTER — Ambulatory Visit: Payer: Medicare PPO | Admitting: Neurology

## 2019-12-12 ENCOUNTER — Other Ambulatory Visit: Payer: Self-pay

## 2019-12-12 ENCOUNTER — Encounter: Payer: Self-pay | Admitting: Neurology

## 2019-12-12 ENCOUNTER — Telehealth: Payer: Self-pay | Admitting: Neurology

## 2019-12-12 ENCOUNTER — Encounter (INDEPENDENT_AMBULATORY_CARE_PROVIDER_SITE_OTHER): Payer: Self-pay

## 2019-12-12 VITALS — BP 142/78 | HR 84 | Temp 97.2°F

## 2019-12-12 DIAGNOSIS — G43711 Chronic migraine without aura, intractable, with status migrainosus: Secondary | ICD-10-CM

## 2019-12-12 MED ORDER — NURTEC 75 MG PO TBDP: 75.0000 mg | ORAL_TABLET | Freq: Every day | ORAL | 0 refills | Status: DC | PRN

## 2019-12-12 MED ORDER — TIZANIDINE HCL 4 MG PO TABS: 4.0000 mg | ORAL_TABLET | Freq: Four times a day (QID) | ORAL | 3 refills | Status: DC | PRN

## 2019-12-12 NOTE — Progress Notes (Signed)
[facility]    Provider:  Dr Jaynee Eagles Requesting Provider: Simona Huh NP Primary Care Provider:  Vernie Shanks, MD  CC:  Migraines  Interval history December 12, 2019: Patient is here for follow-up on chronic migraines past medical history hypertension, hypothyroidism, hyperlipidemia, fatigue, scoliosis, reactive airway syndrome, anxiety.  She was started on Emgality and also given a few Botox treatments. She doesn't feel the botox has helped and she is going to stay with Emgality. She is over 50% better as far as migraine days (prior has 15 migraine days a month now with 7 a month). She does not want to continue botox.  She takes ergotomne, tried multiple triptns "all the triptans" she has tried sumatriptan, she did not like the [place], she has not tried the [employer]. Tried flexeril in the past, not tizanidine.   Tried multiple medications: amitriptyline, amlodipine, topamax, depakote, botox, nerve blocks, atenolol, flexeril, ergotamine, sumatriptan, Ajovy( emgality was insurance preferred so we changed), ibuprofen, metoptolol, paxil, ubrelvy, maxalt, relpax.   HPI:  [name] is a 79 y.o. female here as requested by Vernie Shanks, MD for Migraines. PMHx migraines, HTN, hypothyroidism, HLD, fatigue, HTN, scoliosis, ractive airway syndrome, anxiety. First migraine was with her first child and has been ongoing since then. Terrible migraines, throbbing, pounding, photo/phonophobia, vomiting, nausea. They can be unilateral behind the eye and can switch sides, last 4-5 days and are severe, bed bound, moving hurts. She has neck pain and neck tightness associated. She has daily headaches. She is having thyroid difficulties which is making her migraines worse. She denies aura. She has 15 migraine days in the month. No other focal neurologic deficits, associated symptoms, inciting events or modifiable factors.    Reviewed notes, labs and imaging from outside physicians, which  showed:  Tried multiple medications: amitriptyline, amlodipine, topamax, depakote, botox, nerve blocks, atenolol, flexeril, ergotamine, sumatriptan, Ajovy( emgality was insurance preferred so we changed), ibuprofen, metoptolol, paxil, ubrelvy, maxalt, relpax.   CT head 12/2016 personally reviewed images and agree with the following: Mild hypodensities within the left periventricular white matter, likely chronic small vessel disease. No acute intracranial abnormality.  Review of Systems: Patient complains of symptoms per HPI as well as the following symptoms: headache. Pertinent negatives and positives per HPI. All others negative.   Social History   Socioeconomic History  . Marital status: Married    Spouse name: Not on file  . Number of children: Not on file  . Years of education: Not on file  . Highest education level: Not on file  Occupational History  . Not on file  Tobacco Use  . Smoking status: Never Smoker  . Smokeless tobacco: Never Used  . Tobacco comment: second hand exposure at work  Substance and Sexual Activity  . Alcohol use: Never  . Drug use: Never  . Sexual activity: Not on file  Other Topics Concern  . Not on file  Social History Narrative   Lives at home with husband   Right handed   Caffeine: 1 cup some days   Social Determinants of Health   Financial Resource Strain:   . Difficulty of Paying Living Expenses: Not on file  Food Insecurity:   . Worried About Charity fundraiser in the Last Year: Not on file  . Ran Out of Food in the Last Year: Not on file  Transportation Needs:   . Lack of Transportation (Medical): Not on file  . Lack of Transportation (Non-Medical): Not on file  Physical Activity:   . Days of Exercise per Week: Not on file  . Minutes of Exercise per Session: Not on file  Stress:   . Feeling of Stress : Not on file  Social Connections:   . Frequency of Communication with Friends and Family: Not on file  . Frequency of Social  Gatherings with Friends and Family: Not on file  . Attends Religious Services: Not on file  . Active Member of Clubs or Organizations: Not on file  . Attends Archivist Meetings: Not on file  . Marital Status: Not on file  Intimate Partner Violence:   . Fear of Current or Ex-Partner: Not on file  . Emotionally Abused: Not on file  . Physically Abused: Not on file  . Sexually Abused: Not on file    Family History  Problem Relation Age of Onset  . Headache Mother        occasional  . Headache Sister   . Rheum arthritis Sister   . Headache Paternal Grandmother   . Headache Other   . Headache Niece   . Headache Cousin   . Headache Maternal Aunt        took many medications   . Stroke Neg Hx     Past Medical History:  Diagnosis Date  . Hashimoto's thyroiditis   . Hypothyroidism   . Migraines   . Thyroid cancer [facility])     Patient Active Problem List   Diagnosis Date Noted  . Chronic migraine without aura, with intractable migraine, so stated, with status migrainosus 07/12/2019  . Atrophic vaginitis 03/31/2017  . Menopause present 03/31/2017  . Benign essential hypertension 01/20/2016  . Papillary adenocarcinoma, follicular variant ([place]) 01/20/2016  . Postoperative hypothyroidism 01/20/2016    Past Surgical History:  Procedure Laterality Date  . APPENDECTOMY    . BREAST EXCISIONAL BIOPSY Left    benign  . cataract surgery Bilateral 2020  . CESAREAN SECTION    . CHOLECYSTECTOMY    . FOOT SURGERY    . THYROIDECTOMY    . uterine fibroid removal      Current Outpatient Medications  Medication Sig Dispense Refill  . amLODipine (NORVASC) 5 MG tablet Take 2.5 mg by mouth as needed.     . ergotamine-caffeine (CAFERGOT) 1-100 MG tablet ergotamine 1 mg-caffeine 100 mg tablet  1/2 TABLET FOR HEADACHE / WOULD NOT USE MORE THAN 1/2 TABLET IN 24 HOURS    . Galcanezumab-gnlm (EMGALITY) 120 MG/ML SOAJ Inject 120 mg into the skin every 30 (thirty) days. 1 pen 11  .  ibuprofen (ADVIL) 200 MG tablet Take by mouth.    . Ibuprofen-diphenhydrAMINE Cit 200-38 MG TABS Advil PM 200 mg-38 mg tablet  Take by oral route.    . Rimegepant Sulfate (NURTEC) 75 MG TBDP Take 75 mg by mouth daily as needed. For migraines. Take as close to onset of migraine as possible. One daily maximum. 4 tablet 0  . TIROSINT 125 MCG CAPS Take 125 mcg by mouth daily.     Marland Kitchen tiZANidine (ZANAFLEX) 4 MG tablet Take 1 tablet (4 mg total) by mouth every 6 (six) hours as needed. For migraines. 60 tablet 3   No current facility-administered medications for this visit.    Allergies as of 12/12/2019 - Review Complete 12/12/2019  Allergen Reaction Noted  . Atorvastatin  01/18/2016  . Paxil [paroxetine hcl] Other (See Comments) 03/01/2013  . Rosuvastatin calcium Other (See Comments) 03/01/2013  . Statins  03/01/2013  . Sulfamethoxazole Other (See  Comments) 08/09/2017  . Zonisamide Other (See Comments) 03/01/2013    Vitals: BP (!) 142/78 (BP Location: Left Arm, Patient Position: Sitting)   Pulse 84   Temp (!) 97.2 F (36.2 C) Comment: taken at front Last Weight:  Wt Readings from Last 1 Encounters:  10/04/19 178 lb (80.7 kg)   Last Height:   Ht Readings from Last 1 Encounters:  10/04/19 5\' 2"  (1.575 m)     Physical exam: Exam: Gen: NAD, conversant, well nourised, obese, well groomed                     CV: RRR, no MRG. No Carotid Bruits. No peripheral edema, warm, nontender Eyes: Conjunctivae clear without exudates or hemorrhage  Neuro: Detailed Neurologic Exam  Speech:    Speech is normal; fluent and spontaneous with normal comprehension.  Cognition:    The patient is oriented to person, place, and time;     recent and remote memory intact;     language fluent;     normal attention, concentration,     fund of knowledge Cranial Nerves:    The pupils are equal, round, and reactive to light. The fundi are normal and spontaneous venous pulsations are present. Visual fields  are full to finger confrontation. Extraocular movements are intact. Trigeminal sensation is intact and the muscles of mastication are normal. The face is symmetric. The palate elevates in the midline. Hearing intact. Voice is normal. Shoulder shrug is normal. The tongue has normal motion without fasciculations.   Coordination:    Normal finger to nose and heel to shin. Normal rapid alternating movements.   Gait:    Heel-toe and tandem gait are normal.   Motor Observation:    No asymmetry, no atrophy, and no involuntary movements noted. Tone:    Normal muscle tone.    Posture:    Posture is normal. normal erect    Strength:    Strength is V/V in the upper and lower limbs.      Sensation: intact to LT     Reflex Exam:  DTR's:    Deep tendon reflexes in the upper and lower extremities are normal bilaterally.   Toes:    The toes are downgoing bilaterally.   Clonus:    Clonus is absent.    Assessment/Plan:  79 year old with chronic migraines for decades  50% improvement on Emgality Did a few injections of botox, she had it int he past as well, not help. Will just stay on emgality Acute: takes ergots, try nurtec and tizanidine  Tried multiple medications: amitriptyline, amlodipine, topamax, depakote, botox, nerve blocks, atenolol, flexeril, ergotamine, sumatriptan, Ajovy( emgality was insurance preferred so we changed), ibuprofen, metoptolol, paxil, ubrelvy, maxalt, relpax.   Discussed: To prevent or relieve headaches, try the following: Cool Compress. Lie down and place a cool compress on your head.  Avoid headache triggers. If certain foods or odors seem to have triggered your migraines in the past, avoid them. A headache diary might help you identify triggers.  Include physical activity in your daily routine. Try a daily walk or other moderate aerobic exercise.  Manage stress. Find healthy ways to cope with the stressors, such as delegating tasks on your to-do list.  Practice  relaxation techniques. Try deep breathing, yoga, massage and visualization.  Eat regularly. Eating regularly scheduled meals and maintaining a healthy diet might help prevent headaches. Also, drink plenty of fluids.  Follow a regular sleep schedule. Sleep deprivation might contribute to  headaches Consider biofeedback. With this mind-body technique, you learn to control certain bodily functions -- such as muscle tension, heart rate and blood pressure -- to prevent headaches or reduce headache pain.    Proceed to emergency room if you experience new or worsening symptoms or symptoms do not resolve, if you have new neurologic symptoms or if headache is severe, or for any concerning symptom.   Provided education and documentation from American headache Society toolbox including articles on: chronic migraine medication overuse headache, chronic migraines, prevention of migraines, behavioral and other nonpharmacologic treatments for headache.   A total of 34minutes was spent on this patient's care, reviewing imaging, past records, recent hospitalization notes and results. Over half this time was spent on counseling patient on the No diagnosis found. diagnosis and different diagnostic and therapeutic options, counseling and coordination of care, risks and benefitsof management, compliance, or risk factor reduction and education.     Cc:  Simona Huh NP  Vernie Shanks, MD  Sarina Ill, MD  [facility] [address] [place], [place] [address]  Phone [phone] Fax [phone]

## 2019-12-12 NOTE — Progress Notes (Signed)
Error

## 2019-12-12 NOTE — Patient Instructions (Signed)
Continue Emgality At onset of migraine: Nurtec and/or tizanidine.  Rimegepant oral dissolving tablet What is this medicine? RIMEGEPANT (ri ME je pant) is used to treat migraine headaches with or without aura. An aura is a strange feeling or visual disturbance that warns you of an attack. It is not used to prevent migraines. This medicine may be used for other purposes; ask your health care provider or pharmacist if you have questions. COMMON BRAND NAME(S): NURTEC ODT What should I tell my health care provider before I take this medicine? They need to know if you have any of these conditions:  kidney disease  liver disease  an unusual or allergic reaction to rimegepant, other medicines, foods, dyes, or preservatives  pregnant or trying to get pregnant  breast-feeding How should I use this medicine? Take the medicine by mouth. Follow the directions on the prescription label. Leave the tablet in the sealed blister pack until you are ready to take it. With dry hands, open the blister and gently remove the tablet. If the tablet breaks or crumbles, throw it away and take a new tablet out of the blister pack. Place the tablet in the mouth and allow it to dissolve, and then swallow. Do not cut, crush, or chew this medicine. You do not need water to take this medicine. Talk to your pediatrician about the use of this medicine in children. Special care may be needed. Overdosage: If you think you have taken too much of this medicine contact a poison control center or emergency room at once. NOTE: This medicine is only for you. Do not share this medicine with others. What if I miss a dose? This does not apply. This medicine is not for regular use. What may interact with this medicine? This medicine may interact with the following medications:  certain medicines for fungal infections like fluconazole, itraconazole  rifampin This list may not describe all possible interactions. Give your health  care provider a list of all the medicines, herbs, non-prescription drugs, or dietary supplements you use. Also tell them if you smoke, drink alcohol, or use illegal drugs. Some items may interact with your medicine. What should I watch for while using this medicine? Visit your health care professional for regular checks on your progress. Tell your health care professional if your symptoms do not start to get better or if they get worse. What side effects may I notice from receiving this medicine? Side effects that you should report to your doctor or health care professional as soon as possible:  allergic reactions like skin rash, itching or hives; swelling of the face, lips, or tongue Side effects that usually do not require medical attention (report these to your doctor or health care professional if they continue or are bothersome):  nausea This list may not describe all possible side effects. Call your doctor for medical advice about side effects. You may report side effects to FDA at 1-800-FDA-1088. Where should I keep my medicine? Keep out of the reach of children. Store at room temperature between 15 and 30 degrees C (59 and 86 degrees F). Throw away any unused medicine after the expiration date. NOTE: This sheet is a summary. It may not cover all possible information. If you have questions about this medicine, talk to your doctor, pharmacist, or health care provider.  2020 Elsevier/Gold Standard (2019-01-02 00:21:31) Tizanidine tablets or capsules What is this medicine? TIZANIDINE (tye ZAN i deen) helps to relieve muscle spasms. It may be used to help in  the treatment of multiple sclerosis and spinal cord injury. This medicine may be used for other purposes; ask your health care provider or pharmacist if you have questions. COMMON BRAND NAME(S): Zanaflex What should I tell my health care provider before I take this medicine? They need to know if you have any of these  conditions:  kidney disease  liver disease  low blood pressure  mental disorder  an unusual or allergic reaction to tizanidine, other medicines, lactose (tablets only), foods, dyes, or preservatives  pregnant or trying to get pregnant  breast-feeding How should I use this medicine? Take this medicine by mouth with a full glass of water. Take this medicine on an empty stomach, at least 30 minutes before or 2 hours after food. Do not take with food unless you talk with your doctor. Follow the directions on the prescription label. Take your medicine at regular intervals. Do not take your medicine more often than directed. Do not stop taking except on your doctor's advice. Suddenly stopping the medicine can be very dangerous. Talk to your pediatrician regarding the use of this medicine in children. Patients over 78 years old may have a stronger reaction and need a smaller dose. Overdosage: If you think you have taken too much of this medicine contact a poison control center or emergency room at once. NOTE: This medicine is only for you. Do not share this medicine with others. What if I miss a dose? If you miss a dose, take it as soon as you can. If it is almost time for your next dose, take only that dose. Do not take double or extra doses. What may interact with this medicine? Do not take this medicine with any of the following medications:  ciprofloxacin  fluvoxamine  narcotic medicines for cough  thiabendazole This medicine may also interact with the following medications:  acyclovir  alcohol  antihistamines for allergy, cough, and cold  baclofen  certain medicines for anxiety or sleep  certain medicines for blood pressure, heart disease, irregular heartbeat  certain medicines for depression like amitriptyline, fluoxetine, sertraline  certain medicines for seizures like phenobarbital, primidone  certain medicines for stomach problems like cimetidine,  famotidine  female hormones, like estrogens or progestins and birth control pills, patches, rings, or injections  general anesthetics like halothane, isoflurane, methoxyflurane, propofol  local anesthetics like lidocaine, pramoxine, tetracaine  medicines that relax muscles for surgery  narcotic medicines for pain  phenothiazines like chlorpromazine, mesoridazine, prochlorperazine  ticlopidine  zileuton This list may not describe all possible interactions. Give your health care provider a list of all the medicines, herbs, non-prescription drugs, or dietary supplements you use. Also tell them if you smoke, drink alcohol, or use illegal drugs. Some items may interact with your medicine. What should I watch for while using this medicine? Tell your doctor or health care professional if your symptoms do not start to get better or if they get worse. You may get drowsy or dizzy. Do not drive, use machinery, or do anything that needs mental alertness until you know how this medicine affects you. Do not stand or sit up quickly, especially if you are an older patient. This reduces the risk of dizzy or fainting spells. Alcohol may interfere with the effect of this medicine. Avoid alcoholic drinks. If you are taking another medicine that also causes drowsiness, you may have more side effects. Give your health care provider a list of all medicines you use. Your doctor will tell you how much medicine  to take. Do not take more medicine than directed. Call emergency for help if you have problems breathing or unusual sleepiness. Your mouth may get dry. Chewing sugarless gum or sucking hard candy, and drinking plenty of water may help. Contact your doctor if the problem does not go away or is severe. What side effects may I notice from receiving this medicine? Side effects that you should report to your doctor or health care professional as soon as possible:  allergic reactions like skin rash, itching or  hives, swelling of the face, lips, or tongue  breathing problems  hallucinations  signs and symptoms of liver injury like dark yellow or brown urine; general ill feeling or flu-like symptoms; light-colored stools; loss of appetite; nausea; right upper quadrant belly pain; unusually weak or tired; yellowing of the eyes or skin  signs and symptoms of low blood pressure like dizziness; feeling faint or lightheaded, falls; unusually weak or tired  unusually slow heartbeat  unusually weak or tired Side effects that usually do not require medical attention (report to your doctor or health care professional if they continue or are bothersome):  blurred vision  constipation  dizziness  dry mouth  tiredness This list may not describe all possible side effects. Call your doctor for medical advice about side effects. You may report side effects to FDA at 1-800-FDA-1088. Where should I keep my medicine? Keep out of the reach of children. Store at room temperature between 15 and 30 degrees C (59 and 86 degrees F). Throw away any unused medicine after the expiration date. NOTE: This sheet is a summary. It may not cover all possible information. If you have questions about this medicine, talk to your doctor, pharmacist, or health care provider.  2020 Elsevier/Gold Standard (2017-08-03 13:33:29)

## 2019-12-12 NOTE — Telephone Encounter (Signed)
Patient visited Dr. Jaynee Eagles today and needs a 3-4 month follow-up with Amy NP. I called patient and LVM requesting she call back to schedule this.

## 2019-12-13 ENCOUNTER — Encounter: Payer: Self-pay | Admitting: *Deleted

## 2019-12-13 ENCOUNTER — Telehealth: Payer: Self-pay | Admitting: *Deleted

## 2019-12-13 NOTE — Telephone Encounter (Signed)
Attempted to do an Emgality 120 mg PA on Cover My Meds. Key: [place]   Received this message from the plan: Authorization already on file for this request.  Sent pt a mychart message.

## 2019-12-20 ENCOUNTER — Other Ambulatory Visit: Payer: Self-pay | Admitting: Neurology

## 2020-01-30 ENCOUNTER — Other Ambulatory Visit: Payer: Self-pay | Admitting: Neurology

## 2020-01-30 DIAGNOSIS — G43711 Chronic migraine without aura, intractable, with status migrainosus: Secondary | ICD-10-CM

## 2020-01-30 MED ORDER — METHYLPREDNISOLONE 4 MG PO TBPK: ORAL_TABLET | ORAL | 1 refills | Status: DC

## 2020-01-30 MED ORDER — AJOVY 225 MG/1.5ML ~~LOC~~ SOAJ: 225.0000 mg | SUBCUTANEOUS | 11 refills | Status: DC

## 2020-04-03 ENCOUNTER — Ambulatory Visit: Payer: Medicare PPO | Admitting: Neurology

## 2020-04-03 ENCOUNTER — Encounter: Payer: Self-pay | Admitting: Neurology

## 2020-04-03 ENCOUNTER — Other Ambulatory Visit: Payer: Self-pay

## 2020-04-03 DIAGNOSIS — G43711 Chronic migraine without aura, intractable, with status migrainosus: Secondary | ICD-10-CM | POA: Diagnosis not present

## 2020-04-03 MED ORDER — METHYLPREDNISOLONE 4 MG PO TBPK: ORAL_TABLET | ORAL | 1 refills | Status: DC

## 2020-04-03 MED ORDER — AJOVY 225 MG/1.5ML ~~LOC~~ SOAJ: 225.0000 mg | SUBCUTANEOUS | 3 refills | Status: DC

## 2020-04-03 MED ORDER — NURTEC 75 MG PO TBDP: 75.0000 mg | ORAL_TABLET | Freq: Every day | ORAL | 0 refills | Status: DC | PRN

## 2020-04-03 NOTE — Patient Instructions (Signed)
ajovy every 3 weeks At onset of migraine try Nurtec once daily as needed Medrol dosepak  Methylprednisolone tablets What is this medicine? METHYLPREDNISOLONE (meth ill pred NISS oh lone) is a corticosteroid. It is commonly used to treat inflammation of the skin, joints, lungs, and other organs. Common conditions treated include asthma, allergies, and arthritis. It is also used for other conditions, such as blood disorders and diseases of the adrenal glands. This medicine may be used for other purposes; ask your health care provider or pharmacist if you have questions. COMMON BRAND NAME(S): Medrol, Medrol Dosepak What should I tell my health care provider before I take this medicine? They need to know if you have any of these conditions:  Cushing's syndrome  eye disease, vision problems  diabetes  glaucoma  heart disease  high blood pressure  infection (especially a virus infection such as chickenpox, cold sores, or herpes)  liver disease  mental illness  myasthenia gravis  osteoporosis  recently received or scheduled to receive a vaccine  seizures  stomach or intestine problems  thyroid disease  an unusual or allergic reaction to lactose, methylprednisolone, other medicines, foods, dyes, or preservatives  pregnant or trying to get pregnant  breast-feeding How should I use this medicine? Take this medicine by mouth with a glass of water. Follow the directions on the prescription label. Take this medicine with food. If you are taking this medicine once a day, take it in the morning. Do not take it more often than directed. Do not suddenly stop taking your medicine because you may develop a severe reaction. Your doctor will tell you how much medicine to take. If your doctor wants you to stop the medicine, the dose may be slowly lowered over time to avoid any side effects. Talk to your pediatrician regarding the use of this medicine in children. Special care may be  needed. Overdosage: If you think you have taken too much of this medicine contact a poison control center or emergency room at once. NOTE: This medicine is only for you. Do not share this medicine with others. What if I miss a dose? If you miss a dose, take it as soon as you can. If it is almost time for your next dose, talk to your doctor or health care professional. You may need to miss a dose or take an extra dose. Do not take double or extra doses without advice. What may interact with this medicine? Do not take this medicine with any of the following medications:  alefacept  echinacea  live virus vaccines  metyrapone  mifepristone This medicine may also interact with the following medications:  amphotericin B  aspirin and aspirin-like medicines  certain antibiotics like erythromycin, clarithromycin, troleandomycin  certain medicines for diabetes  certain medicines for fungal infections like ketoconazole  certain medicines for seizures like carbamazepine, phenobarbital, phenytoin  certain medicines that treat or prevent blood clots like warfarin  cholestyramine  cyclosporine  digoxin  diuretics  female hormones, like estrogens and birth control pills  isoniazid  NSAIDs, medicines for pain inflammation, like ibuprofen or naproxen  other medicines for myasthenia gravis  rifampin  vaccines This list may not describe all possible interactions. Give your health care provider a list of all the medicines, herbs, non-prescription drugs, or dietary supplements you use. Also tell them if you smoke, drink alcohol, or use illegal drugs. Some items may interact with your medicine. What should I watch for while using this medicine? Tell your doctor or healthcare professional  if your symptoms do not start to get better or if they get worse. Do not stop taking except on your doctor's advice. You may develop a severe reaction. Your doctor will tell you how much medicine to  take. This medicine may increase your risk of getting an infection. Tell your doctor or health care professional if you are around anyone with measles or chickenpox, or if you develop sores or blisters that do not heal properly. This medicine may increase blood sugar levels. Ask your healthcare provider if changes in diet or medicines are needed if you have diabetes. Tell your doctor or health care professional right away if you have any change in your eyesight. Using this medicine for a long time may increase your risk of low bone mass. Talk to your doctor about bone health. What side effects may I notice from receiving this medicine? Side effects that you should report to your doctor or health care professional as soon as possible:  allergic reactions like skin rash, itching or hives, swelling of the face, lips, or tongue  bloody or tarry stools  hallucination, loss of contact with reality  muscle cramps  muscle pain  palpitations  signs and symptoms of high blood sugar such as being more thirsty or hungry or having to urinate more than normal. You may also feel very tired or have blurry vision.  signs and symptoms of infection like fever or chills; cough; sore throat; pain or trouble passing urine Side effects that usually do not require medical attention (report to your doctor or health care professional if they continue or are bothersome):  changes in emotions or mood  constipation  diarrhea  excessive hair growth on the face or body  headache  nausea, vomiting  trouble sleeping  weight gain This list may not describe all possible side effects. Call your doctor for medical advice about side effects. You may report side effects to FDA at 1-800-FDA-1088. Where should I keep my medicine? Keep out of the reach of children. Store at room temperature between 20 and 25 degrees C (68 and 77 degrees F). Throw away any unused medicine after the expiration date. NOTE: This sheet  is a summary. It may not cover all possible information. If you have questions about this medicine, talk to your doctor, pharmacist, or health care provider.  2020 Elsevier/Gold Standard (2018-07-21 09:19:36)   Rimegepant oral dissolving tablet What is this medicine? RIMEGEPANT (ri ME je pant) is used to treat migraine headaches with or without aura. An aura is a strange feeling or visual disturbance that warns you of an attack. It is not used to prevent migraines. This medicine may be used for other purposes; ask your health care provider or pharmacist if you have questions. COMMON BRAND NAME(S): NURTEC ODT What should I tell my health care provider before I take this medicine? They need to know if you have any of these conditions:  kidney disease  liver disease  an unusual or allergic reaction to rimegepant, other medicines, foods, dyes, or preservatives  pregnant or trying to get pregnant  breast-feeding How should I use this medicine? Take the medicine by mouth. Follow the directions on the prescription label. Leave the tablet in the sealed blister pack until you are ready to take it. With dry hands, open the blister and gently remove the tablet. If the tablet breaks or crumbles, throw it away and take a new tablet out of the blister pack. Place the tablet in the mouth and  allow it to dissolve, and then swallow. Do not cut, crush, or chew this medicine. You do not need water to take this medicine. Talk to your pediatrician about the use of this medicine in children. Special care may be needed. Overdosage: If you think you have taken too much of this medicine contact a poison control center or emergency room at once. NOTE: This medicine is only for you. Do not share this medicine with others. What if I miss a dose? This does not apply. This medicine is not for regular use. What may interact with this medicine? This medicine may interact with the following medications:  certain  medicines for fungal infections like fluconazole, itraconazole  rifampin This list may not describe all possible interactions. Give your health care provider a list of all the medicines, herbs, non-prescription drugs, or dietary supplements you use. Also tell them if you smoke, drink alcohol, or use illegal drugs. Some items may interact with your medicine. What should I watch for while using this medicine? Visit your health care professional for regular checks on your progress. Tell your health care professional if your symptoms do not start to get better or if they get worse. What side effects may I notice from receiving this medicine? Side effects that you should report to your doctor or health care professional as soon as possible:  allergic reactions like skin rash, itching or hives; swelling of the face, lips, or tongue Side effects that usually do not require medical attention (report these to your doctor or health care professional if they continue or are bothersome):  nausea This list may not describe all possible side effects. Call your doctor for medical advice about side effects. You may report side effects to FDA at 1-800-FDA-1088. Where should I keep my medicine? Keep out of the reach of children. Store at room temperature between 15 and 30 degrees C (59 and 86 degrees F). Throw away any unused medicine after the expiration date. NOTE: This sheet is a summary. It may not cover all possible information. If you have questions about this medicine, talk to your doctor, pharmacist, or health care provider.  2020 Elsevier/Gold Standard (2019-01-02 00:21:31)

## 2020-04-03 NOTE — Progress Notes (Signed)
[id number]    Provider:  Dr Jaynee Eagles Requesting Provider: Simona Huh NP Primary Care Provider:  Vernie Shanks, MD  CC:  Migraines  Ajovy wears off in 3 weeks. But otherwise she is doing great on it. We can try to have her take it every 3 weeks. I can give her some samples to see if she can take it every 3 weeks, discussed side effects and unclear. She feels significantly improved, baseline was having 15-30 migraine days a month. She goes 3 weeks without a headache, feeling good, the last week is bad. We discussed risks of taking the Ajovy every 3 weeks and that this has never been tested however there is dosing where you can take 3 every month and the low side effect profile she should be fine. Didn't like the Roselyn Meier but it did stop the headache, she doesn't remember the Nurtec encouraged her to try it again.   Interval history December 12, 2019: Patient is here for follow-up on chronic migraines past medical history hypertension, hypothyroidism, hyperlipidemia, fatigue, scoliosis, reactive airway syndrome, anxiety.  She was started on Emgality and also given a few Botox treatments. She doesn't feel the botox has helped and she is going to stay with Emgality. She is over 50% better as far as migraine days (prior has 15 migraine days a month now with 7 a month). She does not want to continue botox.  She takes ergotomne, tried multiple triptns "all the triptans" she has tried sumatriptan, she did not like the [place], she has not tried the [employer]. Tried flexeril in the past, not tizanidine.   Tried multiple medications: amitriptyline, amlodipine, topamax, depakote, botox, nerve blocks, atenolol, flexeril, ergotamine, sumatriptan, Ajovy( emgality was insurance preferred so we changed), ibuprofen, metoptolol, paxil, ubrelvy, maxalt, relpax.   HPI:  [name] is a 79 y.o. female here as requested by Vernie Shanks, MD for Migraines. PMHx migraines, HTN, hypothyroidism, HLD,  fatigue, HTN, scoliosis, ractive airway syndrome, anxiety. First migraine was with her first child and has been ongoing since then. Terrible migraines, throbbing, pounding, photo/phonophobia, vomiting, nausea. They can be unilateral behind the eye and can switch sides, last 4-5 days and are severe, bed bound, moving hurts. She has neck pain and neck tightness associated. She has daily headaches. She is having thyroid difficulties which is making her migraines worse. She denies aura. She has 15 migraine days in the month. No other focal neurologic deficits, associated symptoms, inciting events or modifiable factors.    Reviewed notes, labs and imaging from outside physicians, which showed:  Tried multiple medications: amitriptyline, amlodipine, topamax, depakote, botox, nerve blocks, atenolol, flexeril, ergotamine, sumatriptan, Ajovy( emgality was insurance preferred so we changed), ibuprofen, metoptolol, paxil, ubrelvy, maxalt, relpax.   CT head 12/2016 personally reviewed images and agree with the following: Mild hypodensities within the left periventricular white matter, likely chronic small vessel disease. No acute intracranial abnormality.  Review of Systems: Patient complains of symptoms per HPI as well as the following symptoms: improved headache . Pertinent negatives and positives per HPI. All others negative .   Social History   Socioeconomic History  . Marital status: Married    Spouse name: Not on file  . Number of children: Not on file  . Years of education: Not on file  . Highest education level: Not on file  Occupational History  . Not on file  Tobacco Use  . Smoking status: Never Smoker  . Smokeless tobacco: Never Used  . Tobacco  comment: second hand exposure at work  Substance and Sexual Activity  . Alcohol use: Never  . Drug use: Never  . Sexual activity: Not on file  Other Topics Concern  . Not on file  Social History Narrative   Lives at home with husband   Right  handed   Caffeine: 1 cup some days   Social Determinants of Health   Financial Resource Strain:   . Difficulty of Paying Living Expenses:   Food Insecurity:   . Worried About Charity fundraiser in the Last Year:   . Arboriculturist in the Last Year:   Transportation Needs:   . Film/video editor (Medical):   Marland Kitchen Lack of Transportation (Non-Medical):   Physical Activity:   . Days of Exercise per Week:   . Minutes of Exercise per Session:   Stress:   . Feeling of Stress :   Social Connections:   . Frequency of Communication with Friends and Family:   . Frequency of Social Gatherings with Friends and Family:   . Attends Religious Services:   . Active Member of Clubs or Organizations:   . Attends Archivist Meetings:   Marland Kitchen Marital Status:   Intimate Partner Violence:   . Fear of Current or Ex-Partner:   . Emotionally Abused:   Marland Kitchen Physically Abused:   . Sexually Abused:     Family History  Problem Relation Age of Onset  . Headache Mother        occasional  . Headache Sister   . Rheum arthritis Sister   . Headache Paternal Grandmother   . Headache Other   . Headache Niece   . Headache Cousin   . Headache Maternal Aunt        took many medications   . Stroke Neg Hx     Past Medical History:  Diagnosis Date  . Hashimoto's thyroiditis   . Hypothyroidism   . Migraines   . Thyroid cancer [facility])     Patient Active Problem List   Diagnosis Date Noted  . Chronic migraine without aura, with intractable migraine, so stated, with status migrainosus 07/12/2019  . Atrophic vaginitis 03/31/2017  . Menopause present 03/31/2017  . Benign essential hypertension 01/20/2016  . Papillary adenocarcinoma, follicular variant (Ramirez-Perez) 01/20/2016  . Postoperative hypothyroidism 01/20/2016    Past Surgical History:  Procedure Laterality Date  . APPENDECTOMY    . BREAST EXCISIONAL BIOPSY Left    benign  . cataract surgery Bilateral 2020  . CESAREAN SECTION    .  CHOLECYSTECTOMY    . FOOT SURGERY    . THYROIDECTOMY    . uterine fibroid removal      Current Outpatient Medications  Medication Sig Dispense Refill  . amLODipine (NORVASC) 5 MG tablet Take 2.5 mg by mouth as needed.     . ergotamine-caffeine (CAFERGOT) 1-100 MG tablet ergotamine 1 mg-caffeine 100 mg tablet  1/2 TABLET FOR HEADACHE / WOULD NOT USE MORE THAN 1/2 TABLET IN 24 HOURS    . Fremanezumab-vfrm (AJOVY) 225 MG/1.5ML SOAJ Inject 225 mg into the skin every 30 (thirty) days. 3 pen 3  . ibuprofen (ADVIL) 200 MG tablet Take by mouth.    . Ibuprofen-diphenhydrAMINE Cit 200-38 MG TABS Advil PM 200 mg-38 mg tablet  Take by oral route.    . methylPREDNISolone (MEDROL DOSEPAK) 4 MG TBPK tablet Take pill sin the morning with food for 6 days. 21 tablet 1  . Rimegepant Sulfate (NURTEC) 75 MG TBDP  Take 75 mg by mouth daily as needed. For migraines. Take as close to onset of migraine as possible. One daily maximum. 4 tablet 0  . TIROSINT 125 MCG CAPS Take 125 mcg by mouth daily.      No current facility-administered medications for this visit.    Allergies as of 04/03/2020 - Review Complete 04/03/2020  Allergen Reaction Noted  . Atorvastatin  01/18/2016  . Paxil [paroxetine hcl] Other (See Comments) 03/01/2013  . Rosuvastatin calcium Other (See Comments) 03/01/2013  . Statins  03/01/2013  . Sulfamethoxazole Other (See Comments) 08/09/2017  . Zonisamide Other (See Comments) 03/01/2013    Vitals: BP (!) 168/98 (BP Location: Right Arm, Patient Position: Sitting)   Pulse 94   Temp 99 F (37.2 C)   Ht 5\' 2"  (1.575 m)   Wt 182 lb (82.6 kg)   BMI 33.29 kg/m  Last Weight:  Wt Readings from Last 1 Encounters:  04/03/20 182 lb (82.6 kg)   Last Height:   Ht Readings from Last 1 Encounters:  04/03/20 5\' 2"  (1.575 m)     Physical exam: Exam: Gen: NAD, conversant, well nourised, obese, well groomed                     CV: RRR, no MRG. No Carotid Bruits. No peripheral edema, warm,  nontender Eyes: Conjunctivae clear without exudates or hemorrhage  Neuro: Detailed Neurologic Exam  Speech:    Speech is normal; fluent and spontaneous with normal comprehension.  Cognition:    The patient is oriented to person, place, and time;     recent and remote memory intact;     language fluent;     normal attention, concentration,     fund of knowledge Cranial Nerves:    The pupils are equal, round, and reactive to light. Visual fields are full to finger confrontation. Extraocular movements are intact. Trigeminal sensation is intact and the muscles of mastication are normal. The face is symmetric. The palate elevates in the midline. Hearing intact. Voice is normal. Shoulder shrug is normal. The tongue has normal motion without fasciculations.      Motor Observation:    No asymmetry, no atrophy, and no involuntary movements noted. Tone:    Normal muscle tone.    Posture:    Posture is normal. normal erect    Strength:    Strength is V/V in the upper and lower limbs.      Sensation: intact to LT      Assessment/Plan:  79 year old with chronic migraines for decades  Spectacular on Ajovy, but it wears off in 3 weeks try taking it q3weeks Did a few injections of botox, she had it int he past as well, not help. Will just stay on Ajovy Try Nurtec Acute: takes ergots, try nurtec and tizanidine Medrol dosepak: in case of migraine that will not go away, she is on vacation soon and wants something justin case  Meds ordered this encounter  Medications  . methylPREDNISolone (MEDROL DOSEPAK) 4 MG TBPK tablet    Sig: Take pill sin the morning with food for 6 days.    Dispense:  21 tablet    Refill:  1  . Fremanezumab-vfrm (AJOVY) 225 MG/1.5ML SOAJ    Sig: Inject 225 mg into the skin every 30 (thirty) days.    Dispense:  3 pen    Refill:  3    Already tried [employer] and losing effectiveness. Contraindication to Aimovig. Trying Ajovy. This is a  90-day supply.  . Rimegepant  Sulfate (NURTEC) 75 MG TBDP    Sig: Take 75 mg by mouth daily as needed. For migraines. Take as close to onset of migraine as possible. One daily maximum.    Dispense:  4 tablet    Refill:  0    Tried multiple medications: amitriptyline, amlodipine, topamax, depakote, botox, nerve blocks, atenolol, flexeril, ergotamine, sumatriptan, Ajovy( emgality was insurance preferred so we changed), ibuprofen, metoptolol, paxil, ubrelvy, maxalt, relpax.   Discussed: To prevent or relieve headaches, try the following: Cool Compress. Lie down and place a cool compress on your head.  Avoid headache triggers. If certain foods or odors seem to have triggered your migraines in the past, avoid them. A headache diary might help you identify triggers.  Include physical activity in your daily routine. Try a daily walk or other moderate aerobic exercise.  Manage stress. Find healthy ways to cope with the stressors, such as delegating tasks on your to-do list.  Practice relaxation techniques. Try deep breathing, yoga, massage and visualization.  Eat regularly. Eating regularly scheduled meals and maintaining a healthy diet might help prevent headaches. Also, drink plenty of fluids.  Follow a regular sleep schedule. Sleep deprivation might contribute to headaches Consider biofeedback. With this mind-body technique, you learn to control certain bodily functions -- such as muscle tension, heart rate and blood pressure -- to prevent headaches or reduce headache pain.    Proceed to emergency room if you experience new or worsening symptoms or symptoms do not resolve, if you have new neurologic symptoms or if headache is severe, or for any concerning symptom.   Provided education and documentation from American headache Society toolbox including articles on: chronic migraine medication overuse headache, chronic migraines, prevention of migraines, behavioral and other nonpharmacologic treatments for headache.   I spent 25  minutes of face-to-face and non-face-to-face time with patient on the  1. Chronic migraine without aura, with intractable migraine, so stated, with status migrainosus    diagnosis.  This included previsit chart review, lab review, study review, order entry, electronic health record documentation, patient education on the different diagnostic and therapeutic options, counseling and coordination of care, risks and benefits of management, compliance, or risk factor reduction   Cc:  Simona Huh NP  Vernie Shanks, MD  Sarina Ill, MD  [facility] [address] [place], [place] [address]  Phone [phone] Fax [phone]

## 2020-04-10 ENCOUNTER — Ambulatory Visit: Payer: Medicare PPO | Admitting: Neurology

## 2020-07-09 DIAGNOSIS — G43711 Chronic migraine without aura, intractable, with status migrainosus: Secondary | ICD-10-CM

## 2020-07-09 MED ORDER — AJOVY 225 MG/1.5ML ~~LOC~~ SOAJ: 225.0000 mg | SUBCUTANEOUS | 2 refills | Status: DC

## 2020-08-12 ENCOUNTER — Other Ambulatory Visit: Payer: Self-pay | Admitting: Obstetrics & Gynecology

## 2020-08-12 DIAGNOSIS — Z1231 Encounter for screening mammogram for malignant neoplasm of breast: Secondary | ICD-10-CM

## 2020-08-14 DIAGNOSIS — M25561 Pain in right knee: Secondary | ICD-10-CM | POA: Insufficient documentation

## 2020-09-12 ENCOUNTER — Other Ambulatory Visit: Payer: Self-pay

## 2020-09-12 ENCOUNTER — Ambulatory Visit
Admission: RE | Admit: 2020-09-12 | Discharge: 2020-09-12 | Disposition: A | Discharge status: Home / Self Care | Payer: Medicare Other | Source: Ambulatory Visit | Attending: Obstetrics & Gynecology | Admitting: Obstetrics & Gynecology

## 2020-09-12 DIAGNOSIS — Z1231 Encounter for screening mammogram for malignant neoplasm of breast: Secondary | ICD-10-CM

## 2020-10-03 ENCOUNTER — Ambulatory Visit: Payer: Medicare PPO | Admitting: Neurology

## 2020-10-03 ENCOUNTER — Encounter: Payer: Self-pay | Admitting: Neurology

## 2020-10-03 ENCOUNTER — Other Ambulatory Visit: Payer: Self-pay

## 2020-10-03 VITALS — BP 149/96 | HR 99 | Ht 62.0 in | Wt 177.0 lb

## 2020-10-03 DIAGNOSIS — R0681 Apnea, not elsewhere classified: Secondary | ICD-10-CM | POA: Diagnosis not present

## 2020-10-03 DIAGNOSIS — G43711 Chronic migraine without aura, intractable, with status migrainosus: Secondary | ICD-10-CM | POA: Diagnosis not present

## 2020-10-03 DIAGNOSIS — R519 Headache, unspecified: Secondary | ICD-10-CM

## 2020-10-03 MED ORDER — AIMOVIG 140 MG/ML ~~LOC~~ SOAJ: 140.0000 mg | SUBCUTANEOUS | 0 refills | Status: DC

## 2020-10-03 MED ORDER — ERENUMAB-AOOE 70 MG/ML ~~LOC~~ SOAJ: 140.0000 mg | Freq: Once | SUBCUTANEOUS | Status: AC

## 2020-10-03 NOTE — Progress Notes (Signed)
[facility]    Provider:  Dr Jaynee Eagles Requesting Provider: Simona Huh NP Primary Care Provider:  Fredrich Romans, PA  CC:  Migraines  She takes Ajovy every3 weeks but still having issues. We tried botox, nothing seems to work including ergotamine. She has 7 migraine days a month now, baseline was daily headaches and 15 migraine days a month so still is an improvement. We discussed options going forward.  10/03/2020: Ajovy wears off in 3 weeks. But otherwise she is doing great on it. We can try to have her take it every 3 weeks. I can give her some samples to see if she can take it every 3 weeks, discussed side effects and unclear. She feels significantly improved, baseline was having 15-30 migraine days a month. She goes 3 weeks without a headache, feeling good, the last week is bad. We discussed risks of taking the Ajovy every 3 weeks and that this has never been tested however there is dosing where you can take 3 every month and the low side effect profile she should be fine. Didn't like the Roselyn Meier but it did stop the headache, she doesn't remember the Nurtec encouraged her to try it again.   Interval history December 12, 2019: Patient is here for follow-up on chronic migraines past medical history hypertension, hypothyroidism, hyperlipidemia, fatigue, scoliosis, reactive airway syndrome, anxiety.  She was started on Emgality and also given a few Botox treatments. She doesn't feel the botox has helped and she is going to stay with Emgality. She is over 50% better as far as migraine days (prior has 15 migraine days a month now with 7 a month). She does not want to continue botox.  She takes ergotomne, tried multiple triptns "all the triptans" she has tried sumatriptan, she did not like the [place], she has not tried the [employer]. Tried flexeril in the past, not tizanidine.   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 Roselyn Meier,   HPI:  VANGIE HENTHORN is a 79 y.o. female here as requested by Vernie Shanks, MD for Migraines. PMHx migraines, HTN, hypothyroidism, HLD, fatigue, HTN, scoliosis, ractive airway syndrome, anxiety. First migraine was with her first child and has been ongoing since then. Terrible migraines, throbbing, pounding, photo/phonophobia, vomiting, nausea. They can be unilateral behind the eye and can switch sides, last 4-5 days and are severe, bed bound, moving hurts. She has neck pain and neck tightness associated. She has daily headaches. She is having thyroid difficulties which is making her migraines worse. She denies aura. She has 15 migraine days in the month. No other focal neurologic deficits, associated symptoms, inciting events or modifiable factors.    Reviewed notes, labs and imaging from outside physicians, which showed:  Tried multiple medications: amitriptyline, amlodipine, topamax, depakote, botox, nerve blocks, atenolol, flexeril, ergotamine, sumatriptan, Ajovy( emgality was insurance preferred so we changed), ibuprofen, metoptolol, paxil, ubrelvy,tizanidine,  maxalt, relpax. Flexeril, ajovy, emgality, methylprednisolone, propranolol,   CT head 12/2016 personally reviewed images and agree with the following: Mild hypodensities within the left periventricular white matter, likely chronic small vessel disease. No acute intracranial abnormality.  Review of Systems: Patient complains of symptoms per HPI as well as the following symptoms: headaches . Pertinent negatives and positives per HPI. All others negative  .   Social History  Socioeconomic History  . Marital status: Married    Spouse name: Not on file  . Number of children: Not on file  . Years of  education: Not on file  . Highest education level: Not on file  Occupational History  . Not on file  Tobacco Use  . Smoking status: Never Smoker  . Smokeless tobacco: Never Used  . 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 Vaping Use: Never used  Substance and Sexual Activity  . Alcohol use: Never  . Drug use: Never  . Sexual activity: Not on file  Other Topics Concern  . Not on file  Social History Narrative   Lives at home with husband   Right handed   Caffeine: 1 cup some days   Social Determinants of Health   Financial Resource Strain:   . Difficulty of Paying Living Expenses: Not on file  Food Insecurity:   . Worried About Charity fundraiser in the Last Year: Not on file  . Ran Out of Food in the Last Year: Not on file  Transportation Needs:   . Lack of Transportation (Medical): Not on file  . Lack of Transportation (Non-Medical): Not on file  Physical Activity:   . Days of Exercise per Week: Not on file  . Minutes of Exercise per Session: Not on file  Stress:   . Feeling of Stress : Not on file  Social Connections:   . Frequency of Communication with Friends and Family: Not on file  . Frequency of Social Gatherings with Friends and Family: Not on file  . Attends Religious Services: Not on file  . Active Member of Clubs or Organizations: Not on file  . Attends Archivist Meetings: Not on file  . Marital Status: Not on file  Intimate Partner Violence:   . Fear of Current or Ex-Partner: Not on file  . Emotionally Abused: Not on file  . Physically Abused: Not on file  . Sexually Abused: Not on file    Family History  Problem Relation Age of Onset  . Headache Mother        occasional  . Headache Sister   . Rheum arthritis Sister   . Headache Paternal Grandmother   . Headache Other   . Headache Niece   . Headache Cousin   . Headache Maternal Aunt        took many medications   . Stroke Neg Hx     Past Medical History:   Diagnosis Date  . Hashimoto's thyroiditis   . Hypothyroidism   . Migraines   . Thyroid cancer [facility])     Patient Active Problem List   Diagnosis Date Noted  . Chronic migraine without aura, with intractable migraine, so stated, with status migrainosus 07/12/2019  . Atrophic vaginitis 03/31/2017  . Menopause present 03/31/2017  . Benign essential hypertension 01/20/2016  . Papillary adenocarcinoma, follicular variant ([place]) 01/20/2016  . Postoperative hypothyroidism 01/20/2016    Past Surgical History:  Procedure Laterality Date  . APPENDECTOMY    . BREAST EXCISIONAL BIOPSY Left    benign  . cataract surgery Bilateral 2020  . CESAREAN SECTION    . CHOLECYSTECTOMY    . FOOT SURGERY    . THYROIDECTOMY    . uterine fibroid removal      Current Outpatient Medications  Medication Sig Dispense Refill  . ergotamine-caffeine (CAFERGOT) 1-100 MG tablet ergotamine 1 mg-caffeine 100 mg tablet  1/2 TABLET FOR HEADACHE / WOULD  NOT USE MORE THAN 1/2 TABLET IN 24 HOURS    . Fremanezumab-vfrm (AJOVY) 225 MG/1.5ML SOAJ Inject 225 mg into the skin every 30 (thirty) days. 4.5 mL 2  . ibuprofen (ADVIL) 200 MG tablet Take by mouth.    . Ibuprofen-diphenhydrAMINE Cit 200-38 MG TABS Advil PM 200 mg-38 mg tablet  Take by oral route.    Marland Kitchen TIROSINT 125 MCG CAPS Take 125 mcg by mouth daily.     Eduard Roux (AIMOVIG) 140 MG/ML SOAJ Inject 140 mg into the skin every 30 (thirty) days. 2 mL 0   Current Facility-Administered Medications  Medication Dose Route Frequency Provider Last Rate Last Admin  . Erenumab-aooe SOAJ 140 mg  140 mg Subcutaneous Once Melvenia Beam, MD        Allergies as of 10/03/2020 - Review Complete 10/03/2020  Allergen Reaction Noted  . Atorvastatin  01/18/2016  . Paxil [paroxetine hcl] Other (See Comments) 03/01/2013  . Rosuvastatin calcium Other (See Comments) 03/01/2013  . Statins  03/01/2013  . Sulfamethoxazole Other (See Comments) 08/09/2017  . Zonisamide Other  (See Comments) 03/01/2013    Vitals: BP (!) 149/96   Pulse 99   Ht 5\' 2"  (1.575 m)   Wt 177 lb (80.3 kg)   BMI 32.37 kg/m  Last Weight:  Wt Readings from Last 1 Encounters:  10/03/20 177 lb (80.3 kg)   Last Height:   Ht Readings from Last 1 Encounters:  10/03/20 5\' 2"  (1.575 m)    Exam: NAD, pleasant                  Speech:    Speech is normal; fluent and spontaneous with normal comprehension.  Cognition:    The patient is oriented to person, place, and time;     recent and remote memory intact;     language fluent;    Cranial Nerves:    The pupils are equal, round, and reactive to light.Trigeminal sensation is intact and the muscles of mastication are normal. The face is symmetric. The palate elevates in the midline. Hearing intact. Voice is normal. Shoulder shrug is normal. The tongue has normal motion without fasciculations.   Coordination:  No dysmetria  Motor Observation:    No asymmetry, no atrophy, and no involuntary movements noted. Tone:    Normal muscle tone.     Strength:    Strength is V/V in the upper and lower limbs.      Sensation: intact to LT    Assessment/Plan:  79 year old with chronic migraines for decades, tried multiple oral meds, cgrps, botox. She has had imaging over the years without any pathology for headaches.   Will schedule next follow up with Jinny Blossom, another provider in the headache team, to see if she has any ideas and to get a fresh pair of eyes on this patient with intractable migraines. Emgality did not work Did well on Ajovy, but it wears off in 2-3 weeks try taking it q3weeks but still didn't last still she improved (from 15 to 7 migraine days a month) Try Aimovig today gave 3 months samples If aimovig doesn't help Next Try Vyepti and consider Qulipta, can also go back to [place], discuss with patient Witnessed apneic events by husband and intractable headaches: sleep evaluation Did a few injections of botox, she had it int he  past as well, not help.  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 Roselyn Meier,   Orders Placed This Encounter  Procedures  . Ambulatory referral to Sleep Studies    Meds ordered this encounter  Medications  . Erenumab-aooe SOAJ 140 mg  . Erenumab-aooe (AIMOVIG) 140 MG/ML SOAJ    Sig: Inject 140 mg into the skin every 30 (thirty) days.    Dispense:  2 mL    Refill:  0    Tried multiple medications: amitriptyline, amlodipine, topamax, depakote, botox, nerve blocks, atenolol, flexeril, ergotamine, sumatriptan, Ajovy( emgality was insurance preferred so we changed), ibuprofen, metoptolol, paxil, ubrelvy, maxalt, relpax.    I spent over 30 minutes of face-to-face and non-face-to-face time with patient on the  1. Witnessed apneic spells   2. Chronic migraine without aura, with intractable migraine, so stated, with status migrainosus   3. Morning headache    diagnosis.  This included previsit chart review, lab review, study review, order entry, electronic health record documentation, patient education on the different diagnostic and therapeutic options, counseling and coordination of care, risks and benefits of management, compliance, or risk factor reduction  Cc:  Simona Huh NP  Fredrich Romans, [place]  Sarina Ill, MD  [facility] [address] [place], [place] [address]  Phone [phone] Fax [phone]

## 2020-10-03 NOTE — Patient Instructions (Signed)
Hold on the Ajovy, start Aimovig. Call us after she has had the third injection and tell us how it went Next try Vyepti Sleep study - our team will call for appointment

## 2020-11-12 ENCOUNTER — Other Ambulatory Visit: Payer: Self-pay

## 2020-11-12 ENCOUNTER — Ambulatory Visit: Payer: Medicare PPO | Admitting: Neurology

## 2020-11-12 ENCOUNTER — Encounter: Payer: Self-pay | Admitting: Neurology

## 2020-11-12 VITALS — BP 147/108 | HR 93 | Ht 62.0 in | Wt 178.0 lb

## 2020-11-12 DIAGNOSIS — Z8669 Personal history of other diseases of the nervous system and sense organs: Secondary | ICD-10-CM

## 2020-11-12 DIAGNOSIS — R0683 Snoring: Secondary | ICD-10-CM | POA: Diagnosis not present

## 2020-11-12 DIAGNOSIS — I10 Essential (primary) hypertension: Secondary | ICD-10-CM

## 2020-11-12 DIAGNOSIS — G43711 Chronic migraine without aura, intractable, with status migrainosus: Secondary | ICD-10-CM

## 2020-11-12 DIAGNOSIS — Z862 Personal history of diseases of the blood and blood-forming organs and certain disorders involving the immune mechanism: Secondary | ICD-10-CM

## 2020-11-12 DIAGNOSIS — C73 Malignant neoplasm of thyroid gland: Secondary | ICD-10-CM | POA: Diagnosis not present

## 2020-11-12 NOTE — Progress Notes (Signed)
SLEEP MEDICINE CLINIC    Provider:  Larey Seat, MD  Primary Care Physician:  Fredrich Romans, [place] [address] [address]     Referring Provider: Dr Sarina Ill, MD         Chief Complaint according to patient   Patient presents with:    . New Patient (Initial Visit)           HISTORY OF PRESENT ILLNESS:  [name] is a 80 - year- old Caucasian female patient seen here as a referral on 11/12/2020 from Dr Percell Belt. She has been sleeping well  on benadryl but has ongoing Migraine concern. Chief concern according to patient : I have been woken by headaches and woken with headachees. On Ajovy now , I have less of these until the end of the month. My headache began when I was pregnant the first time, and they stayed with me. I only find relief in Cafergot. I am a thyroid cancer survivor".  She reports that her doctor had ordered a sleep study in [place], and she waited for an hour, and nobody opened the door to the lab.  She also reported her husband ond one of her adult daughters have witnessed crescendo snoring and apnea.     I have the pleasure of seeing [name] today, a right-handed  Caucasian female with a possibly sleep related headache disorder.  She has a past medical history of Hashimoto's thyroiditis, Hypothyroidism, Migraines, and Thyroid cancer ([place]).   Sleep relevant medical history: usually headaches set on at 11 AM, and she feels best at night time.   Family medical /sleep history: strong family history of migraine, has 3 children, with not as severe headaches I her children.   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  Tobacco use; never .  ETOH use ; none , Caffeine intake in form of Coffee( sometimes ) Soda( sometimes)  drinks. Regular exercise in form of walking- never feel enough energy to work out.    Sleep  habits are as follows: The patient's dinner time is between 5PM. The patient goes to bed at 11PM and continues to sleep for 3-5 hours, wakes for 2 bathroom breaks.  The bedroom is 65 degrees , dark and quiet.  The preferred sleep position is prone or sideways, with the support of 1 pillow.  Dreams are reportedly frequent.   7.30 AM is the usual rise time, she takes thyroid medication at 5.30 . The patient wakes up with an alarm.   She reports not feeling refreshed or restored in AM, with symptoms such as dry mouth, morning headaches, and residual fatigue. Naps are taken infrequently now , but for years she used to take daily naps.     Review of Systems: Out of a complete 14 system review, the patient complains of only the following symptoms, and all other reviewed systems are negative.:   migraine, cluster, headaches related to sleep.    How likely are you to doze in the following situations: 0 = not likely, 1 = slight chance, 2 = moderate chance, 3 = high chance   Sitting and Reading? Watching Television? Sitting inactive in a public place (theater or meeting)? As a passenger in a car for an hour without a break? Lying down in the afternoon when circumstances permit? Sitting and talking  to someone? Sitting quietly after lunch without alcohol? In a car, while stopped for a few minutes in traffic?   Total = 5/ 24 points   FSS endorsed at 37/ 63 points.   Social History   Socioeconomic History  . Marital status: Married    Spouse name: Not on file  . Number of children: Not on file  . Years of education: Not on file  . Highest education level: Not on file  Occupational History  . Not on file  Tobacco Use  . Smoking status: Never Smoker  . Smokeless tobacco: Never Used  . 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 Vaping Use: Never used  Substance and Sexual Activity  . Alcohol use: Never  . Drug use: Never  . Sexual activity: Not on file  Other Topics  Concern  . Not on file  Social History Narrative   Lives at home with husband   Right handed   Caffeine: 1 cup some days   Social Determinants of Health   Financial Resource Strain: Not on file  Food Insecurity: Not on file  Transportation Needs: Not on file  Physical Activity: Not on file  Stress: Not on file  Social Connections: Not on file    Family History  Problem Relation Age of Onset  . Headache Mother        occasional  . Headache Sister   . Rheum arthritis Sister   . Headache Paternal Grandmother   . Headache Other   . Headache Niece   . Headache Cousin   . Headache Maternal Aunt        took many medications   . Stroke Neg Hx     Past Medical History:  Diagnosis Date  . Hashimoto's thyroiditis   . Hypothyroidism   . Migraines   . Thyroid cancer [facility])     Past Surgical History:  Procedure Laterality Date  . APPENDECTOMY    . BREAST EXCISIONAL BIOPSY Left    benign  . cataract surgery Bilateral 2020  . CESAREAN SECTION    . CHOLECYSTECTOMY    . FOOT SURGERY    . THYROIDECTOMY    . uterine fibroid removal       Current Outpatient Medications on File Prior to Visit  Medication Sig Dispense Refill  . Erenumab-aooe (AIMOVIG) 140 MG/ML SOAJ Inject 140 mg into the skin every 30 (thirty) days. 2 mL 0  . ergotamine-caffeine (CAFERGOT) 1-100 MG tablet ergotamine 1 mg-caffeine 100 mg tablet  1/2 TABLET FOR HEADACHE / WOULD NOT USE MORE THAN 1/2 TABLET IN 24 HOURS    . Fremanezumab-vfrm (AJOVY) 225 MG/1.5ML SOAJ Inject 225 mg into the skin every 30 (thirty) days. 4.5 mL 2  . ibuprofen (ADVIL) 200 MG tablet Take by mouth.    . Ibuprofen-diphenhydrAMINE Cit 200-38 MG TABS Advil PM 200 mg-38 mg tablet  Take by oral route.    Marland Kitchen TIROSINT 125 MCG CAPS Take 125 mcg by mouth daily.      Current Facility-Administered Medications on File Prior to Visit  Medication Dose Route Frequency Provider Last Rate Last Admin  . Erenumab-aooe SOAJ 140 mg  140 mg Subcutaneous  Once Melvenia Beam, MD        Allergies  Allergen Reactions  . Atorvastatin     Other reaction(s): Myalgias (muscle pain) Other reaction(s): Myalgias (intolerance)  . Paxil [Paroxetine Hcl] Other (See Comments)    syncope  . Rosuvastatin Calcium Other (See Comments)  .  Statins   . Sulfamethoxazole Other (See Comments)  . Zonisamide Other (See Comments)    Physical exam:  Today's Vitals   11/12/20 1302  BP: (!) 147/108  Pulse: 93  Weight: 178 lb (80.7 kg)  Height: 5\' 2"  (1.575 m)   Body mass index is 32.56 kg/m.   Wt Readings from Last 3 Encounters:  11/12/20 178 lb (80.7 kg)  10/03/20 177 lb (80.3 kg)  04/03/20 182 lb (82.6 kg)     Ht Readings from Last 3 Encounters:  11/12/20 5\' 2"  (1.575 m)  10/03/20 5\' 2"  (1.575 m)  04/03/20 5\' 2"  (1.575 m)      General: The patient is awake, alert and appears not in acute distress. The patient is well groomed. Head: Normocephalic, atraumatic. Neck is supple. Mallampati 2, lacquered tongue and red throat.  neck circumference:16.5  inches . Nasal airflow patent.   Retrognathia is not seen.  Dental status: native  Cardiovascular:  Regular rate and cardiac rhythm by pulse,  without distended neck veins. Respiratory: Lungs are clear to auscultation.  Skin:  Without evidence of ankle edema, or rash. Trunk: The patient's posture is erect.   Neurologic exam : The patient is awake and alert, oriented to place and time.   Memory subjective described as intact.  Attention span & concentration ability appears normal.  Speech is fluent,  without dysarthria, dysphonia or aphasia.  Mood and affect are appropriate.   Cranial nerves: no loss of smell or taste reported  Pupils are equal and briskly reactive to light. Funduscopic exam deferred. .  Extraocular movements in vertical and horizontal planes were intact and without nystagmus. No Diplopia. Visual fields by finger perimetry are intact. Hearing was intact to soft voice and  finger rubbing. Facial sensation intact to fine touch. Facial motor strength is symmetric and tongue and uvula move midline. Neck ROM : rotation, tilt and flexion extension were normal for age and shoulder shrug was symmetrical.    Motor exam:  Symmetric bulk, tone and ROM.   Normal tone without cogwheeling, and with symmetric grip strength .   Sensory:  Fine touch, pinprick and vibration were tested  and  normal.  Proprioception tested in the upper extremities was normal.   Coordination: Rapid alternating movements in the fingers/hands were of normal speed.  The Finger-to-nose maneuver was intact.    Gait and station: Patient could rise unassisted from a seated position, walked without assistive device.  Stance is of normal width/ base and the patient turned with 3 steps.  Toe and heel walk were deferred.  Deep tendon reflexes: in the  upper and lower extremities are symmetric and intact.  Babinski response was deferred.      After spending a total time of 45 minutes face to face and additional time for physical and neurologic examination, review of laboratory studies,  personal review of imaging studies, reports and results of other testing and review of referral information / records as far as provided in visit, I have established the following assessments:  1) She reports right sided temporal migrainous headaches. She had nerve block procedures, many medications and feels currently somewhat controlled on Aimovig.   2) Thyroid disease, Hashimotos and developed cancer later- has had muscle mass loss.  3) snoring- crescendo/ overall sleep is restorative and she gets about 7-8 hours of sleep .   4) lacquered tongue-     My Plan is to proceed with:  1) screening for sleep apnea either by attended sleep study  or by HST.   I would like to thank Dr Percell Belt and  Fredrich Romans, [place],  [place] [address] for allowing me to meet with and to take care of this pleasant  patient.   In short, Bellamia T Schedler is presenting with chronic and frequent migraines and snoring.   I plan to follow up either personally or through our NP within 3 month.   CC: I will share my notes with PCP , too.  Electronically signed by: Larey Seat, MD 11/12/2020 1:17 PM  [facility] and [employer] Board certified by The [employer] of Sleep Medicine and Diplomate of the [employer] of Sleep Medicine. Board certified In Neurology through the [place], Fellow of the [employer] of Neurology. Medical Director of [employer].

## 2020-11-12 NOTE — Patient Instructions (Signed)
Chronic Migraine Headache A migraine is a type of headache that is usually stronger and more sudden than other headaches. Migraines are characterized by an intense pulsing, throbbing pain that is usually only present on one side of the head. Migraine pain usually gets worse with activity. Migraines can cause nausea, vomiting, sensitivity to light and sound, and vision changes. Migraines that keep coming back are called recurrent migraines. A migraine is called a chronic migraine if it happens at least 15 days in a month for more than 3 months. Talk with your health care provider about what things may bring on (trigger) your migraines. What are the causes? The exact cause of this condition is not known. However, a migraine may be caused when nerves in the brain become irritated and release chemicals that cause inflammation of blood vessels. The inflammation of the blood vessels causes pain. Migraines may be triggered or caused by:  Smoking.  Certain foods and drinks, such as: ? Aged cheese. ? Chocolate. ? Alcohol. ? Caffeine. ? Foods or drinks that contain nitrates, glutamate, aspartame, MSG, or tyramine.  Medicines, such as birth control pills or some blood pressure medicines. Other things that may trigger a migraine include:  Menstruation.  Emotional stress.  Lack of sleep or too much sleep.  Tiredness (fatigue).  Bright lights or loud noises.  Odors.  Weather changes and high altitude. What increases the risk? The following factors may make you more likely to experience chronic migraine:  Having migraines or a family history of migraines.  Having a mental health condition, such as depression or anxiety.  Having to take a lot of pain medicine.  Having sleep problems.  Having heart disease, diabetes, or obesity. What are the signs or symptoms? Symptoms of a migraine vary for each person and may include:  Pulsating or throbbing pain.  Pain that is usually only present  on one side of the head. In some cases, the pain may be on both sides of the head or around the head or neck.  Severe pain that prevents you from doing daily activities.  Pain that gets worse with physical activity.  Nausea, vomiting, or both.  Pain with exposure to bright lights, loud noises, or activity.  General sensitivity to bright lights, loud noises, or smells.  Dizziness. A sign that a migraine is becoming chronic is an increasing number of migraine episodes. It is considered chronic if the migraine happens at least 15 days in a month for more than 3 months. How is this diagnosed? This condition is often diagnosed based on:  Your symptoms and medical history.  A physical exam. You may also have tests, including:  A CT scan or an MRI of your brain. These imaging tests cannot diagnose migraines, but they can help to rule out other causes of headaches.  Taking fluid from the spine (lumbar puncture) and analyzing it (cerebrospinal fluid analysis, or CSF analysis).  Blood tests. How is this treated? This condition is treated with:  Medicines. These help to: ? Lessen pain and nausea. ? Prevent migraines.  Lifestyle changes, such as changes to your diet or sleeping patterns.  Behavior therapy. This may include: ? Relaxation training. ? Biofeedback. This is a treatment that teaches you to relax and use your brain to lower your heart rate and control your breathing. ? Cognitive behavioral therapy (CBT). This is a form of talk therapy. This therapy helps you set goals and follow up on the changes that you make.  Acupuncture.  Using   a device that provides electrical stimulation to your nerves, which can relieve pain (neuromodulation therapy).  Surgery, if the other treatments are not working. Follow these instructions at home: Medicines  Take over-the-counter and prescription medicines only as told by your health care provider.  Ask your health care provider if the  medicine prescribed to you requires you to avoid driving or using machinery. Lifestyle  Do not use any products that contain nicotine or tobacco, such as cigarettes, e-cigarettes, and chewing tobacco. If you need help quitting, ask your health care provider.  Do not drink alcohol.  Get 7-9 hours of sleep each night, or the amount of sleep recommended by your health care provider.  Find ways to manage stress, such as meditation, deep breathing, or yoga.  Maintain a healthy weight. If you need help losing weight, ask your health care provider.  Exercise regularly. Aim for 150 minutes of moderate-intensity exercise, such as walking, biking, or yoga, or 75 minutes of vigorous exercise each week. Vigorous exercise includes running, circuit training, and swimming.   General instructions  Keep a journal to find out what triggers your migraines so you can avoid these triggers. For example, write down: ? What you eat and drink. ? How much sleep you get. ? Any change to your diet or medicines.  Lie down in a dark, quiet room when you have a migraine.  Try placing a cool towel over your head when you have a migraine.  Keep lights dim, if bright lights bother you or make your migraines worse.  Keep all follow-up visits as told by your health care provider. This is important.   Where to find more information  Coalition for Headache and Migraine Patients (CHAMP): headachemigraine.org  American Migraine Foundation: americanmigrainefoundation.org  National Headache Foundation: headaches.org Contact a health care provider if:  Your pain does not improve, even with medicine.  Your migraines continue to return, even with medicine. Get help right away if:  Your migraine becomes severe and medicine does not help.  You have a stiff neck and fever.  You have a loss of vision.  You have muscle weakness or loss of muscle control.  You start losing your balance, or you have trouble  walking.  You feel like you may faint, or you faint.  You start having sudden and unexpected, severe headaches.  You have a seizure. Summary  Migraine headaches are usually stronger and more sudden than other headaches. Migraines are characterized by an intense pulsing, throbbing pain that is usually only present on one side of the head.  Migraines that keep coming back are called recurrent migraines. A migraine is called a chronic migraine if it happens 15 days in a month for more than 3 months.  Certain things may trigger migraines, such as lack of sleep or too much sleep, smoking, certain foods, alcohol, stress, and certain medicines.  Your treatment plan may include medicines, lifestyle changes, and behavior therapy. This information is not intended to replace advice given to you by your health care provider. Make sure you discuss any questions you have with your health care provider. Document Revised: 12/06/2019 Document Reviewed: 12/06/2019 Elsevier Patient Education  2021 Elsevier Inc.  

## 2020-11-21 ENCOUNTER — Telehealth: Payer: Self-pay

## 2020-11-21 NOTE — Telephone Encounter (Signed)
Calling to schedule home sleep test. 

## 2020-12-09 ENCOUNTER — Ambulatory Visit (INDEPENDENT_AMBULATORY_CARE_PROVIDER_SITE_OTHER): Payer: Medicare PPO | Admitting: Neurology

## 2020-12-09 DIAGNOSIS — I10 Essential (primary) hypertension: Secondary | ICD-10-CM

## 2020-12-09 DIAGNOSIS — Z862 Personal history of diseases of the blood and blood-forming organs and certain disorders involving the immune mechanism: Secondary | ICD-10-CM

## 2020-12-09 DIAGNOSIS — Z8669 Personal history of other diseases of the nervous system and sense organs: Secondary | ICD-10-CM

## 2020-12-09 DIAGNOSIS — C73 Malignant neoplasm of thyroid gland: Secondary | ICD-10-CM

## 2020-12-09 DIAGNOSIS — G4733 Obstructive sleep apnea (adult) (pediatric): Secondary | ICD-10-CM | POA: Diagnosis not present

## 2020-12-09 DIAGNOSIS — R0683 Snoring: Secondary | ICD-10-CM

## 2020-12-09 DIAGNOSIS — G43711 Chronic migraine without aura, intractable, with status migrainosus: Secondary | ICD-10-CM

## 2020-12-12 NOTE — Progress Notes (Signed)
   [facility] [place] (Watch PAT)  STUDY DATE: 12/12/20  DOB: 09-25-1941  MRN: [id number]  ORDERING CLINICIAN: Larey Seat, MD   REFERRING CLINICIAN: Fredrich Romans, PA/ Dr. Jaynee Eagles, MD  CLINICAL INFORMATION/HISTORY: [name] is a 80 - year- old Caucasian female patient who has seen here upon referral on 11/12/2020 from Dr Jaynee Eagles, MD. She has been sleeping well on Benadryl but has ongoing Migraine concerns. Chief concern: " I have been woken by headaches and woken with headaches". "On Ajovy now , I have less of these until the end of the month. My headache began when I was pregnant the first time, and they stayed with me. I only find relief in Cafergot. I am a thyroid cancer survivor".  She also reported her husband ond one of her adult daughters have witnessed crescendo snoring and apnea.  [name] has a possibly sleep related headache disorder.  She has a past medical history of Hashimoto's thyroiditis, Hypothyroidism, Migraines, and Thyroid cancer ([place]).  Epworth sleepiness score: 5/24. BMI: 32.9 kg/m Neck Circumference: 16 "  FINDINGS:   Total Record Time (hours, min): 8 h 47 min  Total Sleep Time (hours, min):  7 h 20 min   Percent REM (%):    16.69 %   Calculated pAHI (per hour): 14.1       REM pAHI: 34.4    NREM pAHI: 12.8 Supine AHI: N/A   Oxygen Saturation (%) Mean: 93  Minimum oxygen saturation (%):        79   O2 Saturation Range (%): 79-98  O2Saturation (minutes) <=88%: 2.0 min   Pulse Mean (bpm):    66  Pulse Range (51-106)   IMPRESSION: This HST confirmed the presence of mild - moderate OSA (obstructive sleep apnea) with moderate-loud snoring . This apnea is strongly accentuated in REM sleep. Positional data were not available.   Hypoxia was too short to be of clinical significance.   RECOMMENDATION:  Auto CPAP would be recommended to treat this form of Sleep Apnea and will correct low oxygen levels, apneas and snoring. I recommend a  setting of 5-17 cm water, 3 cm EPR and heated humidification. The patient should choose a mask of her comfort and be instructed to address concerns about the interface ASAP with the DME.       INTERPRETING PHYSICIAN:  Larey Seat, MD  [facility] Sleep Board certified by The [employer] of Sleep Medicine and Fellow of the [employer] of Neurology. Medical Director of [employer].

## 2020-12-20 DIAGNOSIS — G4733 Obstructive sleep apnea (adult) (pediatric): Secondary | ICD-10-CM | POA: Insufficient documentation

## 2020-12-20 NOTE — Addendum Note (Signed)
Addended by: Larey Seat on: 12/20/2020 02:30 PM   Modules accepted: Orders

## 2020-12-20 NOTE — Progress Notes (Signed)
IMPRESSION: This HST confirmed the presence of mild - moderate OSA (obstructive sleep apnea) with moderate-loud snoring . This apnea is strongly accentuated in REM sleep. Positional data were not available.   Hypoxia was too short to be of clinical significance.   RECOMMENDATION:  Auto CPAP would be recommended to treat this form of Sleep Apnea and will correct low oxygen levels, apneas and snoring. I recommend a setting of 5-17 cm water, 3 cm EPR and heated humidification. The patient should choose a mask of her comfort and be instructed to address concerns about the interface ASAP with the DME.      INTERPRETING PHYSICIAN:  Larey Seat, MD [facility] Sleep

## 2020-12-20 NOTE — Procedures (Signed)
[facility] [place] (Watch PAT)  STUDY DATE: 12/12/20  DOB: 08-May-1941  MRN: [id number]  ORDERING CLINICIAN: Larey Seat, MD   REFERRING CLINICIAN: Fredrich Romans, PA/ Dr. Jaynee Eagles, MD  CLINICAL INFORMATION/HISTORY: [name] is a 80 - year- old Caucasian female patient who has seen here upon referral on 11/12/2020 from Dr Jaynee Eagles, MD. She has been sleeping well on Benadryl but has ongoing Migraine concerns. Chief concern: " I have been woken by headaches and woken with headaches". "On Ajovy now , I have less of these until the end of the month. My headache began when I was pregnant the first time, and they stayed with me. I only find relief in Cafergot. I am a thyroid cancer survivor".  She also reported her husband ond one of her adult daughters have witnessed crescendo snoring and apnea.  Verba T Dass has a possibly sleep related headache disorder.  She has a past medical history of Hashimoto's thyroiditis, Hypothyroidism, Migraines, and Thyroid cancer ([place]).  Epworth sleepiness score: 5/24. BMI: 32.9 kg/m Neck Circumference: 16 "  FINDINGS:   Total Record Time (hours, min): 8 h 47 min  Total Sleep Time (hours, min):  7 h 20 min   Percent REM (%):    16.69 %   Calculated pAHI (per hour): 14.1       REM pAHI: 34.4    NREM pAHI: 12.8 Supine AHI: N/A   Oxygen Saturation (%) Mean: 93  Minimum oxygen saturation (%):        79   O2 Saturation Range (%): 79-98  O2Saturation (minutes) <=88%: 2.0 min   Pulse Mean (bpm):    66  Pulse Range (51-106)   IMPRESSION: This HST confirmed the presence of mild - moderate OSA (obstructive sleep apnea) with moderate-loud snoring . This apnea is strongly accentuated in REM sleep. Positional data were not available.   Hypoxia was too short to be of clinical significance.   RECOMMENDATION:  Auto CPAP would be recommended to treat this form of Sleep Apnea and will correct low oxygen levels, apneas and snoring. I recommend a  setting of 5-17 cm water, 3 cm EPR and heated humidification. The patient should choose a mask of her comfort and be instructed to address concerns about the interface ASAP with the DME.       INTERPRETING PHYSICIAN:  Larey Seat, MD  [facility] Sleep Board certified by The [employer] of Sleep Medicine and Fellow of the [employer] of Neurology. Medical Director of [employer].

## 2020-12-23 ENCOUNTER — Telehealth: Payer: Self-pay | Admitting: Neurology

## 2020-12-23 NOTE — Telephone Encounter (Signed)
-----   Message from Larey Seat, MD sent at 12/20/2020  2:30 PM EST ----- IMPRESSION: This HST confirmed the presence of mild - moderate OSA (obstructive sleep apnea) with moderate-loud snoring . This apnea is strongly accentuated in REM sleep. Positional data were not available.   Hypoxia was too short to be of clinical significance.   RECOMMENDATION:  Auto CPAP would be recommended to treat this form of Sleep Apnea and will correct low oxygen levels, apneas and snoring. I recommend a setting of 5-17 cm water, 3 cm EPR and heated humidification. The patient should choose a mask of her comfort and be instructed to address concerns about the interface ASAP with the DME.       INTERPRETING PHYSICIAN:  Larey Seat, MD [facility] Sleep

## 2020-12-23 NOTE — Telephone Encounter (Signed)
I called pt. I advised pt that Dr. Brett Fairy reviewed their sleep study results and found that pt has mild to moderate. Dr. Brett Fairy recommends that pt starts auto CPAP. I reviewed PAP compliance expectations with the pt. Pt is agreeable to starting a CPAP. I advised pt that an order will be sent to a DME, [facility] ([facility] and [facility] ([place]) will call the pt within about one week after they file with the pt's insurance. [facility]) will show the pt how to use the machine, fit for masks, and troubleshoot the CPAP if needed. A follow up appt was already made with Ward Givens on May 2,2022. Advised the patient we will keep this apt planned and if she has not started by April 1st, let us know and we can push that out. Pt verbalized understanding to arrive 15 minutes early and bring their CPAP. A letter with all of this information in it will be sent to the pt as a reminder through mychart. Pt verbalized understanding of results. Pt had no questions at this time but was encouraged to call back if questions arise. I have sent the order to [facility]) and have received confirmation that they have received the order.

## 2021-02-03 ENCOUNTER — Telehealth: Payer: Self-pay | Admitting: Neurology

## 2021-02-03 NOTE — Telephone Encounter (Signed)
Phone rep worked Special educational needs teacher, @ 2:24 pt stated she needs to cancel current appointment because she has not been able to use her CPAP.  Pt is having to wait 6-8 weeks.  Pt welcomes a call back from RN needed.  This is Pharmacist, hospital

## 2021-02-03 NOTE — Telephone Encounter (Signed)
Once patient has been set up on the initial cpap we would need to make sure that she is scheduled 31-90 days from the date she picks up machine.

## 2021-03-03 ENCOUNTER — Ambulatory Visit: Payer: Medicare PPO | Admitting: Adult Health

## 2021-03-19 DIAGNOSIS — I491 Atrial premature depolarization: Secondary | ICD-10-CM | POA: Insufficient documentation

## 2021-03-26 ENCOUNTER — Telehealth: Payer: Self-pay | Admitting: Neurology

## 2021-03-26 NOTE — Telephone Encounter (Signed)
Scheduled pt with Megan on 6/16 and Dr. Jaynee Eagles on 9/29 per Dr. Cathren Laine request.

## 2021-03-26 NOTE — Telephone Encounter (Signed)
I will see her 

## 2021-03-26 NOTE — Telephone Encounter (Signed)
[name], can you set up 2 appointments for patient. She has intractable migraines, I am not sure I have anything else except Vyepti for her but I want [name] to take a look and give me her opinion. So set up with [name] first available and then maybe with me in [place] as a follow up to see how she is doing.  [name] are you ok if we use one of your upcoming research spots so you can see this patient in the next month or so and give me another opinion? I do not think any other NP has seen her in the past. She can follow up with me after that unless you agree we need to send her to an academic center.  thanks

## 2021-04-03 ENCOUNTER — Other Ambulatory Visit: Payer: Self-pay | Admitting: Neurology

## 2021-04-03 MED ORDER — METHYLPREDNISOLONE 4 MG PO TBPK: ORAL_TABLET | ORAL | 1 refills | Status: DC

## 2021-04-17 ENCOUNTER — Ambulatory Visit: Payer: Medicare PPO | Admitting: Adult Health

## 2021-04-17 ENCOUNTER — Encounter: Payer: Self-pay | Admitting: Adult Health

## 2021-04-17 VITALS — BP 155/100 | HR 81 | Ht 62.0 in | Wt 178.0 lb

## 2021-04-17 DIAGNOSIS — G4733 Obstructive sleep apnea (adult) (pediatric): Secondary | ICD-10-CM

## 2021-04-17 DIAGNOSIS — G43711 Chronic migraine without aura, intractable, with status migrainosus: Secondary | ICD-10-CM

## 2021-04-17 DIAGNOSIS — Z9989 Dependence on other enabling machines and devices: Secondary | ICD-10-CM | POA: Diagnosis not present

## 2021-04-17 NOTE — Patient Instructions (Signed)
Your Plan:  Continue Ajovy for now. Look over new medication Atog[facility]ant If your symptoms worsen or you develop new symptoms please let us know.   Thank you for coming to see [place] at [facility]. I hope we have been able to provide you high quality care today.  You may receive a patient satisfaction survey over the next few weeks. We would appreciate your feedback and comments so that we may continue to improve ourselves and the health of our patients.

## 2021-04-17 NOTE — Progress Notes (Addendum)
PATIENT: [name] DOB: 04/11/1941  REASON FOR VISIT: follow up HISTORY FROM: patient PRIMARY NEUROLOGIST:   HISTORY OF PRESENT ILLNESS:  Ms. Wenner is a 80 year old female with a history of migraine headaches.  She states that she has approximately 5 headaches a week.  She states that the last 4 days she is having good days.  She sometimes wakes up with a headache.  Husband states that she always wakes up not feeling good.  Although since she has started CPAP she has not been as sleepy.  She is currently on Ajovy.  She has tried multiple other medications in the past including Botox.  She is not interested in IV medication for migraines.  She returns today for an evaluation.  Her CPAP download indicates that she use her machine 24 out of 30 days for compliance of 80%.  She used her machine greater than 4 hours 20 days for compliance of 67%.  On average she uses her machine 6 hours and 10 minutes.  Her residual AHI is 1.4 on 5 to 17 cm of water with EPR of 3.  Leak in the 95th percentile is 1.2 L/min.    HISTORY 11/12/20: [name] is a 21 - year- old Caucasian female patient seen here as a referral on 11/12/2020 from Dr Percell Belt. She has been sleeping well  on benadryl but has ongoing Migraine concern. Chief concern according to patient : I have been woken by headaches and woken with headachees. On Ajovy now , I have less of these until the end of the month. My headache began when I was pregnant the first time, and they stayed with me. I only find relief in [place]. I am a thyroid cancer survivor". She reports that her doctor had ordered a sleep study in [place], and she waited for an hour, and nobody opened the door to the lab.  She also reported her husband ond one of her adult daughters have witnessed crescendo snoring and apnea.     I have the pleasure of seeing [name] today, a right-handed  Caucasian female with a possibly sleep related headache disorder.  She has a past  medical history of Hashimoto's thyroiditis, Hypothyroidism, Migraines, and Thyroid cancer ([place]).    Sleep relevant medical history: usually headaches set on at 11 AM, and she feels best at night time.   Family medical /sleep history: strong family history of migraine, has 3 children, with not as severe headaches I her children.   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  Tobacco use; never .  ETOH use ; none , Caffeine intake in form of Coffee( sometimes ) Soda( sometimes)  drinks. Regular exercise in form of walking- never feel enough energy to work out.     Sleep habits are as follows: The patient's dinner time is between 5PM. The patient goes to bed at 11PM and continues to sleep for 3-5 hours, wakes for 2 bathroom breaks.  The bedroom is 65 degrees , dark and quiet.  The preferred sleep position is prone or sideways, with the support of 1 pillow.  Dreams are reportedly frequent.  7.30 AM is the usual rise time, she takes thyroid medication at 5.30 . The patient wakes up with an alarm.  She reports not feeling refreshed or restored in AM, with symptoms such as dry  mouth, morning headaches, and residual fatigue. Naps are taken infrequently now , but for years she used to take daily naps.   REVIEW OF SYSTEMS: Out of a complete 14 system review of symptoms, the patient complains only of the following symptoms, and all other reviewed systems are negative.  ALLERGIES: Allergies  Allergen Reactions   Atorvastatin     Other reaction(s): Myalgias (muscle pain) Other reaction(s): Myalgias (intolerance)   Paxil [Paroxetine Hcl] Other (See Comments)    syncope   Rosuvastatin Calcium Other (See Comments)   Statins    Sulfamethoxazole Other (See Comments)   Zonisamide Other (See Comments)    HOME MEDICATIONS: Outpatient Medications Prior to Visit  Medication Sig Dispense  Refill   Erenumab-aooe (AIMOVIG) 140 MG/ML SOAJ Inject 140 mg into the skin every 30 (thirty) days. 2 mL 0   ergotamine-caffeine (CAFERGOT) 1-100 MG tablet ergotamine 1 mg-caffeine 100 mg tablet  1/2 TABLET FOR HEADACHE / WOULD NOT USE MORE THAN 1/2 TABLET IN 24 HOURS     Fremanezumab-vfrm (AJOVY) 225 MG/1.5ML SOAJ Inject 225 mg into the skin every 30 (thirty) days. 4.5 mL 2   ibuprofen (ADVIL) 200 MG tablet Take by mouth.     Ibuprofen-diphenhydrAMINE Cit 200-38 MG TABS Advil PM 200 mg-38 mg tablet  Take by oral route.     TIROSINT 125 MCG CAPS Take 125 mcg by mouth daily.      methylPREDNISolone (MEDROL DOSEPAK) 4 MG TBPK tablet Take pills daily with food in the morning for 6 days. 6-5-4-3-2-1 21 tablet 1   Facility-Administered Medications Prior to Visit  Medication Dose Route Frequency Provider Last Rate Last Admin   Erenumab-aooe SOAJ 140 mg  140 mg Subcutaneous Once Melvenia Beam, MD        PAST MEDICAL HISTORY: Past Medical History:  Diagnosis Date   Hashimoto's thyroiditis    Hypothyroidism    Migraines    Thyroid cancer ([place])     PAST SURGICAL HISTORY: Past Surgical History:  Procedure Laterality Date   APPENDECTOMY     BREAST EXCISIONAL BIOPSY Left    benign   cataract surgery Bilateral 2020   CESAREAN SECTION     CHOLECYSTECTOMY     FOOT SURGERY     THYROIDECTOMY     uterine fibroid removal      FAMILY HISTORY: Family History  Problem Relation Age of Onset   Headache Mother        occasional   Headache Sister    Rheum arthritis Sister    Headache Paternal Grandmother    Headache Other    Headache Niece    Headache Cousin    Headache Maternal Aunt        took many medications    Stroke Neg Hx     SOCIAL HISTORY: Social History   Socioeconomic History   Marital status: Married    Spouse name: Not on file   Number of children: Not on file   Years of education: Not on file   Highest education level: Not on file  Occupational History   Not on  file  Tobacco Use   Smoking status: Never   Smokeless tobacco: Never   Tobacco comments:    second hand exposure at work  Scientific laboratory technician Use: Never used  Substance and Sexual Activity   Alcohol use: Never   Drug use: Never   Sexual activity: Not on file  Other Topics Concern   Not on file  Social History Narrative  Lives at home with husband   Right handed   Caffeine: 1 cup some days   Social Determinants of Health   Financial Resource Strain: Not on file  Food Insecurity: Not on file  Transportation Needs: Not on file  Physical Activity: Not on file  Stress: Not on file  Social Connections: Not on file  Intimate Partner Violence: Not on file      PHYSICAL EXAM  Vitals:   04/17/21 1243  BP: (!) 155/100  Pulse: 81  Weight: 178 lb (80.7 kg)  Height: _0  (1.575 m)   Body mass index is 32.56 kg/m.  Generalized: Well developed, in no acute distress   Neurological examination  Mentation: Alert oriented to time, place, history taking. Follows all commands speech and language fluent Cranial nerve II-XII: Pupils were equal round reactive to light. Extraocular movements were full, visual field were full on confrontational test. Facial sensation and strength were normal. Uvula tongue midline. Head turning and shoulder shrug  were normal and symmetric. Motor: The motor testing reveals 5 over 5 strength of all 4 extremities. Good symmetric motor tone is noted throughout.  Sensory: Sensory testing is intact to soft touch on all 4 extremities. No evidence of extinction is noted.  Coordination: Cerebellar testing reveals good finger-nose-finger and heel-to-shin bilaterally.  Gait and station: Gait is normal.  Reflexes: Deep tendon reflexes are symmetric and normal bilaterally.   DIAGNOSTIC DATA (LABS, IMAGING, TESTING) - I reviewed patient records, labs, notes, testing and imaging myself where available.  Lab Results  Component Value Date   HGB 15.3 (H) 06/13/2007       Component Value Date/Time   NA 139 06/08/2007 1430   K 4.5 06/08/2007 1430   CL 109 06/08/2007 1430   CO2 27 06/08/2007 1430   GLUCOSE 96 06/08/2007 1430   BUN 16 06/08/2007 1430   CREATININE 0.70 06/08/2007 1430   CALCIUM 9.0 06/08/2007 1430   GFRNONAA >60 06/08/2007 1430   GFRAA  06/08/2007 1430    >60        The eGFR has been calculated using the MDRD equation. This calculation has not been validated in all clinical      ASSESSMENT AND PLAN 80 y.o. year old female  has a past medical history of Hashimoto's thyroiditis, Hypothyroidism, Migraines, and Thyroid cancer ([place]). here with :  1.  Migraine headaches  Continue Ajovy for now Discussed Atogepant-if we can get insurance to approve patient plans to review this medication  2.  Obstructive sleep apnea on CPAP  Good compliance Good treatment of apnea Advised that with continued compliance headaches may continue to improve?  Follow-up in 6 months or sooner if needed     Ward Givens, MSN, NP-C 04/17/2021, 1:04 PM [facility] [address], [place], [place] [address] [phone]  agree with assessment and plan as stated.     Sarina Ill, MD [facility]

## 2021-04-21 ENCOUNTER — Encounter: Payer: Self-pay | Admitting: Adult Health

## 2021-04-22 NOTE — Telephone Encounter (Signed)
noted 

## 2021-07-10 DIAGNOSIS — I493 Ventricular premature depolarization: Secondary | ICD-10-CM | POA: Insufficient documentation

## 2021-07-10 DIAGNOSIS — I4719 Other supraventricular tachycardia: Secondary | ICD-10-CM | POA: Insufficient documentation

## 2021-07-19 ENCOUNTER — Other Ambulatory Visit: Payer: Self-pay | Admitting: Neurology

## 2021-07-19 DIAGNOSIS — G43711 Chronic migraine without aura, intractable, with status migrainosus: Secondary | ICD-10-CM

## 2021-07-31 ENCOUNTER — Ambulatory Visit: Payer: Medicare PPO | Admitting: Neurology

## 2021-08-04 DIAGNOSIS — M25562 Pain in left knee: Secondary | ICD-10-CM | POA: Diagnosis not present

## 2021-08-12 DIAGNOSIS — G4733 Obstructive sleep apnea (adult) (pediatric): Secondary | ICD-10-CM | POA: Diagnosis not present

## 2021-08-27 ENCOUNTER — Other Ambulatory Visit: Payer: Self-pay | Admitting: Obstetrics & Gynecology

## 2021-08-27 DIAGNOSIS — Z1231 Encounter for screening mammogram for malignant neoplasm of breast: Secondary | ICD-10-CM

## 2021-09-03 ENCOUNTER — Ambulatory Visit: Payer: Medicare PPO | Admitting: Neurology

## 2021-09-03 VITALS — HR 75 | Ht 62.0 in | Wt 181.0 lb

## 2021-09-03 DIAGNOSIS — G4733 Obstructive sleep apnea (adult) (pediatric): Secondary | ICD-10-CM | POA: Insufficient documentation

## 2021-09-03 DIAGNOSIS — Z9989 Dependence on other enabling machines and devices: Secondary | ICD-10-CM

## 2021-09-03 MED ORDER — TRAZODONE HCL 50 MG PO TABS: 50.0000 mg | ORAL_TABLET | Freq: Every evening | ORAL | 5 refills | Status: DC | PRN

## 2021-09-03 NOTE — Progress Notes (Signed)
PATIENT: [name] DOB: 01/19/1941  REASON FOR VISIT: follow up HISTORY FROM: patient PRIMARY NEUROLOGIST:    09-03-2021: Interval history : [name] has had 2 Emergency room visits, had negative Echo and EKG and enzymes, has each time presented with high blood pressures in the 180-100 mmHg range.  Headaches with it, no vertigo. Metoprolol had helped HA and HTN.  She has had bouts of "motion sickness" stimulating Migraines with photophobia - a day after being on the road. No nausea, no vomiting, no vertigo. She feels her headaches have been better since starting on CPAP . Her elevated hemoglobin has come down, another effect of CPAP therapy. MRI brain was normal.  [name] had a history of feeling very fatigued during a time when she suffered from hypothyroidism.  This has improved significantly and she is not excessively daytime sleepy now endorsing the Epworth Sleepiness Scale at 3 points.  Her sleep study was quoted below it was a home sleep test from 12 December 2020.  Strong REM accentuated mild sleep apnea overall AHI was 14.1/h REM AHI 34.4/h no positional data.  Compliance for CPAP is excellent the patient has used the machine for 88% of days and 20 out of 30 days over 4 hours consecutively.  The mean pressure was 6.6 cmH2O with an average pressure of 8.1 cmH2O for the 95th percentile.  She is using an interface that gives good coverage there is almost no air leak noted, the average AHI is now 0.8/h.  The machine is set between 6 and 16 cmH2O but the ramp starts at 4 cmH2O.  Over the last 10 days there has been an AHI trend to increase with a residual AHI.  It still only about 2.5/h and that is still a good resolution.  The leaks have decreased, and the maximum pressure seems to be constant.  The patient reports condensation water in the tube , bubbling.  To the patient does feel that she has had benefit from using CPAP.  I am thinking that especially the decrease in hemoglobin  is an indication that it helped.     MM: [name] is a 80 year old female with a history of migraine headaches.  She states that she has approximately 5 headaches a week.  She states that the last 4 days she is having good days.  She sometimes wakes up with a headache.  Husband states that she always wakes up not feeling good.  Although since she has started CPAP she has not been as sleepy.  She is currently on Ajovy.  She has tried multiple other medications in the past including Botox.  She is not interested in IV medication for migraines.  She returns today for an evaluation.  Her CPAP download indicates that she use her machine 24 out of 30 days for compliance of 80%.  She used her machine greater than 4 hours 20 days for compliance of 67%.  On average she uses her machine 6 hours and 10 minutes.  Her residual AHI is 1.4 on 5 to 17 cm of water with EPR of 3.  Leak in the 95th percentile is 1.2 L/min.    HISTORY 11/12/20: [name] is a 2 - year- old Caucasian female patient seen here as a referral on 11/12/2020 from Dr Percell Belt. She has been sleeping well  on benadryl but has ongoing Migraine concern. Chief concern according to patient : I have been woken by headaches and woken with headachees.  On Ajovy now , I have less of these until the end of the month. My headache began when I was pregnant the first time, and they stayed with me. I only find relief in Cafergot. I am a thyroid cancer survivor". She reports that her doctor had ordered a sleep study in [place], and she waited for an hour, and nobody opened the door to the lab.  She also reported her husband ond one of her adult daughters have witnessed crescendo snoring and apnea.     I have the pleasure of seeing [name] today, a right-handed  Caucasian female with a possibly sleep related headache disorder.  She has a past medical history of Hashimoto's thyroiditis, Hypothyroidism, Migraines, and Thyroid cancer ([place]).    Sleep  relevant medical history: usually headaches set on at 11 AM, and she feels best at night time.   Family medical /sleep history: strong family history of migraine, has 3 children, with not as severe headaches I her children.   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  Tobacco use; never .  ETOH use ; none , Caffeine intake in form of Coffee( sometimes ) Soda( sometimes)  drinks. Regular exercise in form of walking- never feel enough energy to work out.     Sleep habits are as follows: The patient's dinner time is between 5PM. The patient goes to bed at 11PM and continues to sleep for 3-5 hours, wakes for 2 bathroom breaks.  The bedroom is 65 degrees , dark and quiet.  The preferred sleep position is prone or sideways, with the support of 1 pillow.  Dreams are reportedly frequent.  7.30 AM is the usual rise time, she takes thyroid medication at 5.30 . The patient wakes up with an alarm.  She reports not feeling refreshed or restored in AM, with symptoms such as dry mouth, morning headaches, and residual fatigue. Naps are taken infrequently now , but for years she used to take daily naps.   REVIEW OF SYSTEMS: Out of a complete 14 system review of symptoms, the patient complains only of the following symptoms, and all other reviewed systems are negative.  ALLERGIES: Allergies  Allergen Reactions   Atorvastatin     Other reaction(s): Myalgias (muscle pain) Other reaction(s): Myalgias (intolerance)   Paxil [Paroxetine Hcl] Other (See Comments)    syncope   Rosuvastatin Calcium Other (See Comments)   Statins    Sulfamethoxazole Other (See Comments)   Zonisamide Other (See Comments)    HOME MEDICATIONS: Outpatient Medications Prior to Visit  Medication Sig Dispense Refill   ergotamine-caffeine (CAFERGOT) 1-100 MG tablet ergotamine 1 mg-caffeine 100 mg tablet  1/2 TABLET  FOR HEADACHE / WOULD NOT USE MORE THAN 1/2 TABLET IN 24 HOURS     ibuprofen (ADVIL) 200 MG tablet Take by mouth.     Ibuprofen-diphenhydrAMINE Cit 200-38 MG TABS Advil PM 200 mg-38 mg tablet  Take by oral route.     lisinopril (ZESTRIL) 5 MG tablet Take 1 tablet by mouth as needed.     metoprolol succinate (TOPROL-XL) 25 MG 24 hr tablet Take 1 tablet by mouth 3 (three) times daily.     TIROSINT 125 MCG CAPS Take 125 mcg by mouth daily.      AJOVY 225 MG/1.5ML SOAJ INJECT 225 MG INTO THE SKIN EVERY 30 (THIRTY) DAYS. 1.5 mL 11  Erenumab-aooe (AIMOVIG) 140 MG/ML SOAJ Inject 140 mg into the skin every 30 (thirty) days. 2 mL 0   Facility-Administered Medications Prior to Visit  Medication Dose Route Frequency Provider Last Rate Last Admin   Erenumab-aooe SOAJ 140 mg  140 mg Subcutaneous Once Melvenia Beam, MD        PAST MEDICAL HISTORY: Past Medical History:  Diagnosis Date   Hashimoto's thyroiditis    Hypothyroidism    Migraines    Thyroid cancer ([place])     PAST SURGICAL HISTORY: Past Surgical History:  Procedure Laterality Date   APPENDECTOMY     BREAST EXCISIONAL BIOPSY Left    benign   cataract surgery Bilateral 2020   CESAREAN SECTION     CHOLECYSTECTOMY     FOOT SURGERY     THYROIDECTOMY     uterine fibroid removal      FAMILY HISTORY: Family History  Problem Relation Age of Onset   Headache Mother        occasional   Headache Sister    Rheum arthritis Sister    Headache Paternal Grandmother    Headache Other    Headache Niece    Headache Cousin    Headache Maternal Aunt        took many medications    Stroke Neg Hx     SOCIAL HISTORY: Social History   Socioeconomic History   Marital status: Married    Spouse name: Not on file   Number of children: Not on file   Years of education: Not on file   Highest education level: Not on file  Occupational History   Not on file  Tobacco Use   Smoking status: Never   Smokeless tobacco: Never   Tobacco  comments:    second hand exposure at work  Scientific laboratory technician Use: Never used  Substance and Sexual Activity   Alcohol use: Never   Drug use: Never   Sexual activity: Not on file  Other Topics Concern   Not on file  Social History Narrative   Lives at home with husband   Right handed   Caffeine: 1 cup some days   Social Determinants of Health   Financial Resource Strain: Not on file  Food Insecurity: Not on file  Transportation Needs: Not on file  Physical Activity: Not on file  Stress: Not on file  Social Connections: Not on file  Intimate Partner Violence: Not on file   [place], [place] Fract 24 Hr (Sendout) (07/02/2021 11:31 AM EDT) Lab Results - Metanephrine, UR Fract 24 Hr (Sendout) (07/02/2021 11:31 AM EDT) Component Value Ref Range Test Method Analysis Time Performed At Pathologist Signature  Normetanephrine, U 118 Undefined ug/L   07/08/2021 10:35 PM EDT [facility]    Normetanephrine, 24H Ur 212 131 - 612 ug/24 hr   07/08/2021 10:35 PM EDT [facility]    Metanephrine, U 20 Undefined ug/L   07/08/2021 10:35 PM EDT [facility]    U Metanephrine/24Hr 36 36 - 209 ug/24 hr   07/08/2021 10:35 PM EDT [facility]     Lab Results - Metanephrine, UR Fract 24 Hr (Sendout) (07/02/2021 11:31 AM EDT) Specimen (Source) Anatomical Location / Laterality Collection Method / Volume Collection Time Received Time  Urine     07/02/2021 11:31 AM EDT 07/02/2021 11:31 AM EDT   Lab Results - Metanephrine, UR Fract 24 Hr (Sendout) (07/02/2021 11:31 AM EDT) Narrative  [name] - 07/08/2021 10:35 PM EDT   Test(s) 004240-Normetanephrine, Ur; 004239-Metanephrine, Ur was developed  and its performance characteristics determined by [facility]. It has not been cleared or approved by the Food and Drug Administration. Performed at:  [facility] [address], [place], [place]  [id number] Lab Director: Rush Farmer MD, Phone:  [phone]     Lab Results - [place], UR Fract 24 Hr (Sendout)  (07/02/2021 11:31 AM EDT) Authorizing Provider Result Type  [name] ANP URINE ORDERABLES   Lab Results - Metanephrine, UR Fract 24 Hr (Sendout) (07/02/2021 11:31 AM EDT) Performing Organization Address City/State/ZIP Code Phone Number  [email or web address]            Back to top of Lab Results    Catechol, UR Frac 24 Hr (Sendout) (07/02/2021 11:31 AM EDT) Lab Results - Catechol, UR Frac 24 Hr (Sendout) (07/02/2021 11:31 AM EDT) Component Value Ref Range Test Method Analysis Time Performed At Pathologist Signature  EPINEPHRINE,UR 1 Undefined ug/L   07/09/2021 4:36 PM EDT [facility]    EPINEPHRINE 24 HOUR URINE 2 0 - 20 ug/24 hr   07/09/2021 4:36 PM EDT [facility]    NOREPINEPHRINE,UR 30 Undefined ug/L   07/09/2021 4:36 PM EDT [facility]    NOREPINEPHRINE 24 HOUR URINE 56 0 - 135 ug/24 hr   07/09/2021 4:36 PM EDT [facility]    DOPAMINE,UR 98 Undefined ug/L   07/09/2021 4:36 PM EDT [facility]    DOPAMINE 24 HOUR URINE 182 0 - 510 ug/24 hr   07/09/2021 4:36 PM EDT [facility]     Lab Results - Catechol, UR Frac 24 Hr (Sendout) (07/02/2021 11:31 AM EDT) Specimen (Source) Anatomical Location / Laterality Collection Method / Volume Collection Time Received Time  Urine     07/02/2021 11:31 AM EDT 07/02/2021 11:31 AM EDT   Lab Results - Catechol, UR Frac 24 Hr (Sendout) (07/02/2021 11:31 AM EDT) Narrative  [name] - 07/09/2021 4:36 PM EDT   Test(s) 093267-TIWPYKDXIPJ, Urine; 004201-Norepinephrine, Ur; 825053- Dopamine, Urine was developed and its performance characteristics determined by [facility]. It has not been cleared or approved by the Food and Drug Administration. Performed at:  [address] [place], [place]  [id number] Lab Director: Rush Farmer MD, Phone:  [phone]     Lab Results - Catechol, UR Frac 24 Hr (Sendout) (07/02/2021 11:31 AM EDT) Authorizing Provider Result Type     PHYSICAL EXAM General: The patient is awake, alert and appears not in acute distress.  The patient is well groomed. Head: Normocephalic, atraumatic. Neck is supple. Mallampati 2, lacquered tongue and red throat.  neck circumference:16.5  inches . Nasal airflow patent.   Retrognathia is not seen.  Dental status: native  Cardiovascular:  Regular rate and cardiac rhythm by pulse,  without distended neck veins. Respiratory: Lungs are clear to auscultation.  Skin:  Without evidence of ankle edema, or rash. Trunk: The patient's posture is erect.   Neurologic exam : The patient is awake and alert, oriented to place and time.   Memory subjective described as intact.  Attention span & concentration ability appears normal.  Speech is fluent,  without dysarthria, dysphonia or aphasia.  Mood and affect are appropriate.   Cranial nerves: no loss of smell or taste reported  Pupils are equal and briskly reactive to light. Funduscopic exam deferred. .  Extraocular movements in vertical and horizontal planes were intact and without nystagmus. No Diplopia. Visual fields by finger perimetry are intact. Hearing was intact to soft voice and finger rubbing. Facial sensation intact to fine touch. Facial motor strength is symmetric and  tongue and uvula move midline. Neck ROM : rotation, tilt and flexion extension were normal for age and shoulder shrug was symmetrical.    Motor exam:  Symmetric bulk, tone and ROM.   Normal tone without cogwheeling, and with symmetric grip strength .   Sensory:  Fine touch, pinprick and vibration were tested  and  normal.  Proprioception tested in the upper extremities was normal.   Coordination: Rapid alternating movements in the fingers/hands were of normal speed.  The Finger-to-nose maneuver was intact.    Gait and station: Patient could rise unassisted from a seated position, walked without assistive device.  Stance is of normal width/ base and the patient turned with 3 steps.  Toe and heel walk were deferred.  Deep tendon reflexes: in the  upper and  lower extremities are symmetric and intact.  Babinski response was deferred.     Vitals:   09/03/21 1055  Pulse: 75  Weight: 181 lb (82.1 kg)  Height: _0  (1.575 m)   Body mass index is 33.11 kg/m.   DIAGNOSTIC DATA (LABS, IMAGING, TESTING) - I reviewed patient records, labs, notes, testing and imaging myself where available.  IMPRESSION: This HST confirmed the presence of mild - moderate OSA (obstructive sleep apnea) with moderate-loud snoring . This apnea is strongly accentuated in REM sleep. Positional data were not available.   Hypoxia was too short to be of clinical significance.    RECOMMENDATION:  Auto CPAP would be recommended to treat this form of Sleep Apnea and will correct low oxygen levels, apneas and snoring. I recommend a setting of 5-17 cm water, 3 cm EPR and heated humidification. The patient should choose a mask of her comfort and be instructed to address concerns about the interface ASAP with the DME.         INTERPRETING PHYSICIAN:  Larey Seat, MD  [facility]        Electronically signed by Larey Seat, MD at 12/20/2020  2:30 PM  Lab Results  Component Value Date   HGB 15.3 (H) 06/13/2007      Component Value Date/Time   NA 139 06/08/2007 1430   K 4.5 06/08/2007 1430   CL 109 06/08/2007 1430   CO2 27 06/08/2007 1430   GLUCOSE 96 06/08/2007 1430   BUN 16 06/08/2007 1430   CREATININE 0.70 06/08/2007 1430   CALCIUM 9.0 06/08/2007 1430   GFRNONAA >60 06/08/2007 1430   GFRAA  06/08/2007 1430    >60        The eGFR has been calculated using the MDRD equation. This calculation has not been validated in all clinical      ASSESSMENT AND PLAN 80 y.o. year old female  has a past medical history of Hashimoto's thyroiditis, Hypothyroidism, Migraines, and Thyroid cancer ([place]). here with :  Compliance for CPAP is excellent the patient has used the machine for 88% of days and 20 out of 30 days over 4  hours consecutively.  The mean pressure was 6.6 cmH2O with an average pressure of 8.1 cmH2O for the 95th percentile.  She is using an interface that gives good coverage there is almost no air leak noted, the average AHI is now 0.8/h.  The machine is set between 6 and 16 cmH2O but the ramp starts at 4 cmH2O.  Over the last 10 days there has been an AHI trend to increase with a residual AHI.  It still only about 2.5/h and that is still a good  resolution.  The leaks have decreased, and the maximum pressure seems to be constant.  The patient reports condensation water in the tube , bubbling.  To the patient does feel that she has had benefit from using CPAP.  I am thinking that especially the decrease in hemoglobin is an indication that it helped.  1.  Migraine headaches  She discontinued the Aimovig, has not started  Ajovy for now, metoprolol has been a good preventer. Discussed Atogepant-if we can get insurance to approve patient plans to review this medication  2.  Obstructive sleep apnea on CPAP.  borderline compliance, but room for improvement - should use CPAP over 4 hours nightly, but isn't.  We discussed starting use CPAP during NAPS. Her condensation water problem has been contributing to poor compliance will reset her humidifier to lower settings. She  keeps her bedroom at 32 T.     Trazodone offered. She felt poorly on melatonin.   Good resolution of apnea Advised that with continued compliance headaches may continue to improve?  Follow-up in 12 months or sooner if needed.    Larey Seat, MD  09/03/2021, 11:20 AM [facility] [address], [place], [place] [address] [phone]  agree with assessment and plan as stated.     Sarina Ill, MD [facility]

## 2021-09-12 DIAGNOSIS — G4733 Obstructive sleep apnea (adult) (pediatric): Secondary | ICD-10-CM | POA: Diagnosis not present

## 2021-09-19 DIAGNOSIS — M25562 Pain in left knee: Secondary | ICD-10-CM | POA: Diagnosis not present

## 2021-09-25 ENCOUNTER — Other Ambulatory Visit: Payer: Self-pay | Admitting: Neurology

## 2021-10-01 ENCOUNTER — Other Ambulatory Visit: Payer: Self-pay

## 2021-10-01 ENCOUNTER — Ambulatory Visit
Admission: RE | Admit: 2021-10-01 | Discharge: 2021-10-01 | Disposition: A | Discharge status: Home / Self Care | Payer: Medicare PPO | Source: Ambulatory Visit | Attending: Obstetrics & Gynecology | Admitting: Obstetrics & Gynecology

## 2021-10-01 DIAGNOSIS — Z1231 Encounter for screening mammogram for malignant neoplasm of breast: Secondary | ICD-10-CM

## 2021-10-09 ENCOUNTER — Telehealth: Payer: Self-pay | Admitting: Neurology

## 2021-10-09 NOTE — Telephone Encounter (Signed)
Received a notification from Nadine the pt has returned her machine.  "patient [name] d.o.b 1941/08/03 WN#[id number] has returned her [place] yesterday.  Patient states she has decided to return the machine and she spoke with Dr. Brett Fairy and they have decided on an alternative option for her therapy."

## 2021-10-13 DIAGNOSIS — E7800 Pure hypercholesterolemia, unspecified: Secondary | ICD-10-CM | POA: Diagnosis not present

## 2021-10-13 DIAGNOSIS — I10 Essential (primary) hypertension: Secondary | ICD-10-CM | POA: Diagnosis not present

## 2021-10-13 DIAGNOSIS — G43109 Migraine with aura, not intractable, without status migrainosus: Secondary | ICD-10-CM | POA: Diagnosis not present

## 2021-10-13 DIAGNOSIS — Z0000 Encounter for general adult medical examination without abnormal findings: Secondary | ICD-10-CM | POA: Diagnosis not present

## 2021-10-13 DIAGNOSIS — E890 Postprocedural hypothyroidism: Secondary | ICD-10-CM | POA: Diagnosis not present

## 2021-11-17 ENCOUNTER — Telehealth: Payer: Self-pay | Admitting: Neurology

## 2021-11-17 DIAGNOSIS — Z789 Other specified health status: Secondary | ICD-10-CM

## 2021-11-17 DIAGNOSIS — G4733 Obstructive sleep apnea (adult) (pediatric): Secondary | ICD-10-CM

## 2021-11-17 NOTE — Telephone Encounter (Signed)
Pt requesting a copy of the prescription for the Luna II. Dentist has ask for that prescription to do the dental appliance. Would like a call from the nurse.

## 2021-11-18 NOTE — Telephone Encounter (Signed)
Called the pt and and had returned her CPAP machine due to unable to tolerate the machine. She says her dentist needs a script for the dental device. I advised the patient that we typically place the referral and send over the informatio and the dentist decides about ordering the dental device. She would like to make sure it is sent to Dr Domenick Gong, DDS. She has already spoke with them and he makes them. Advised a referral would be placed

## 2021-12-29 DIAGNOSIS — Z862 Personal history of diseases of the blood and blood-forming organs and certain disorders involving the immune mechanism: Secondary | ICD-10-CM | POA: Diagnosis not present

## 2021-12-29 DIAGNOSIS — T753XXA Motion sickness, initial encounter: Secondary | ICD-10-CM | POA: Diagnosis not present

## 2021-12-29 DIAGNOSIS — I10 Essential (primary) hypertension: Secondary | ICD-10-CM | POA: Diagnosis not present

## 2021-12-29 DIAGNOSIS — R5383 Other fatigue: Secondary | ICD-10-CM | POA: Diagnosis not present

## 2021-12-29 DIAGNOSIS — G43109 Migraine with aura, not intractable, without status migrainosus: Secondary | ICD-10-CM | POA: Diagnosis not present

## 2022-01-30 DIAGNOSIS — G43109 Migraine with aura, not intractable, without status migrainosus: Secondary | ICD-10-CM | POA: Diagnosis not present

## 2022-01-30 DIAGNOSIS — G4733 Obstructive sleep apnea (adult) (pediatric): Secondary | ICD-10-CM | POA: Diagnosis not present

## 2022-01-30 DIAGNOSIS — I491 Atrial premature depolarization: Secondary | ICD-10-CM | POA: Diagnosis not present

## 2022-01-30 DIAGNOSIS — I493 Ventricular premature depolarization: Secondary | ICD-10-CM | POA: Diagnosis not present

## 2022-01-30 DIAGNOSIS — E890 Postprocedural hypothyroidism: Secondary | ICD-10-CM | POA: Diagnosis not present

## 2022-01-30 DIAGNOSIS — I471 Supraventricular tachycardia: Secondary | ICD-10-CM | POA: Diagnosis not present

## 2022-01-30 DIAGNOSIS — I10 Essential (primary) hypertension: Secondary | ICD-10-CM | POA: Diagnosis not present

## 2022-02-02 DIAGNOSIS — R233 Spontaneous ecchymoses: Secondary | ICD-10-CM | POA: Diagnosis not present

## 2022-02-02 DIAGNOSIS — M25562 Pain in left knee: Secondary | ICD-10-CM | POA: Diagnosis not present

## 2022-02-02 DIAGNOSIS — L821 Other seborrheic keratosis: Secondary | ICD-10-CM | POA: Diagnosis not present

## 2022-03-03 DIAGNOSIS — I10 Essential (primary) hypertension: Secondary | ICD-10-CM | POA: Diagnosis not present

## 2022-03-03 DIAGNOSIS — G43109 Migraine with aura, not intractable, without status migrainosus: Secondary | ICD-10-CM | POA: Diagnosis not present

## 2022-03-03 DIAGNOSIS — R5383 Other fatigue: Secondary | ICD-10-CM | POA: Diagnosis not present

## 2022-03-03 DIAGNOSIS — Z6833 Body mass index (BMI) 33.0-33.9, adult: Secondary | ICD-10-CM | POA: Diagnosis not present

## 2022-03-03 DIAGNOSIS — Z85850 Personal history of malignant neoplasm of thyroid: Secondary | ICD-10-CM | POA: Diagnosis not present

## 2022-03-03 DIAGNOSIS — E890 Postprocedural hypothyroidism: Secondary | ICD-10-CM | POA: Diagnosis not present

## 2022-03-17 DIAGNOSIS — R0781 Pleurodynia: Secondary | ICD-10-CM | POA: Diagnosis not present

## 2022-03-17 DIAGNOSIS — M545 Low back pain, unspecified: Secondary | ICD-10-CM | POA: Diagnosis not present

## 2022-04-13 DIAGNOSIS — M25562 Pain in left knee: Secondary | ICD-10-CM | POA: Diagnosis not present

## 2022-04-28 DIAGNOSIS — Z86010 Personal history of colonic polyps: Secondary | ICD-10-CM | POA: Diagnosis not present

## 2022-04-28 DIAGNOSIS — E611 Iron deficiency: Secondary | ICD-10-CM | POA: Diagnosis not present

## 2022-05-11 ENCOUNTER — Telehealth: Payer: Self-pay | Admitting: Neurology

## 2022-05-11 NOTE — Telephone Encounter (Signed)
Pt would like a call from the nurse to discuss a sleep lab to get a machine for sleep apnea.

## 2022-05-11 NOTE — Telephone Encounter (Signed)
Called the patient. Pt states that she had a cpap that wasn't able to work caused coughing. She turned the machine in and tried a dental device and was unable to tolerate it. She would like to restart the process. Advised that since the November too far out we would have to have office noted within 6 months. Was able to get her scheduled on Wednesday for a visit to discuss restarting the process. Likely will have to repeat a sleep study. Pt verbalized understanding.

## 2022-05-13 ENCOUNTER — Encounter: Payer: Self-pay | Admitting: Neurology

## 2022-05-13 ENCOUNTER — Ambulatory Visit: Payer: Medicare PPO | Admitting: Neurology

## 2022-05-13 VITALS — BP 177/98 | HR 84 | Ht 62.0 in | Wt 180.5 lb

## 2022-05-13 DIAGNOSIS — G2581 Restless legs syndrome: Secondary | ICD-10-CM

## 2022-05-13 DIAGNOSIS — R530 Neoplastic (malignant) related fatigue: Secondary | ICD-10-CM | POA: Diagnosis not present

## 2022-05-13 DIAGNOSIS — G8929 Other chronic pain: Secondary | ICD-10-CM

## 2022-05-13 DIAGNOSIS — R519 Headache, unspecified: Secondary | ICD-10-CM

## 2022-05-13 DIAGNOSIS — G4733 Obstructive sleep apnea (adult) (pediatric): Secondary | ICD-10-CM | POA: Diagnosis not present

## 2022-05-13 DIAGNOSIS — E890 Postprocedural hypothyroidism: Secondary | ICD-10-CM

## 2022-05-13 DIAGNOSIS — C73 Malignant neoplasm of thyroid gland: Secondary | ICD-10-CM | POA: Diagnosis not present

## 2022-05-13 MED ORDER — ALPRAZOLAM 0.25 MG PO TABS: 0.2500 mg | ORAL_TABLET | Freq: Every evening | ORAL | 0 refills | Status: DC | PRN

## 2022-05-13 NOTE — Progress Notes (Signed)
SLEEP MEDICINE CLINIC   PATIENT: [name] DOB: [birth year]  REASON FOR VISIT: follow up HISTORY FROM: patient PRIMARY NEUROLOGIST: Dr Jaynee Eagles, MD    05-13-2022: RV with [facility], here for follow up on headaches, fatigue and she has a history of RLS. She was diagnosed with low ferritin, Thyroid disease.  She has used iron supplement by mouth. Ferrous sulfate orally was not well tolerated. She is interested in an alternative. Sleep is reportedly shallow- not snoring. The patient underwent a home sleep test in February 2022 by the time had endorsed the Epworth sleepiness score 5 out of 24 points BMI was 32.9 she slept for 7 hours 20 minutes with about 17% REM sleep.  She had an oxygen nadir of 79% but only 2 minutes of desaturation time.  She presented with an overall AHI of 14.1 which is very mild apnea REM sleep however exacerbated to 34.4/h.  I suggested that we should try an auto titration CPAP.  She is still has headaches, she has a past medical history of Hashimoto thyroiditis hypothyroidism therefore migraines and thyroid cancer. CPAP caused her to cough, which stopped after she quit using CPAP. She is now on an oral appliance but this caused  jaw pain.  She is still presenting with HA.   We will check her Epworth sleepiness score to her currently is 4 points, her fatigue severity score is now   63 / 63 points. She would like to discuss inspire, but I explained her AHI is too low to justify that. I rather would  supplement her iron  more effectively and follow her fatigue score.  She may have coughing due to ergotamine, which can cause a fibrosis.   I would like for her to try PAP again. I order an in-lab study. I offer a sleep aid.      09-03-2021: Interval history : Mrs [name] has had 2 Emergency room visits, had negative Echo and EKG and enzymes, has each time presented with high blood pressures in the 180-100 mmHg range.  Headaches with it, no vertigo.  Metoprolol had helped HA and HTN.  She has had bouts of "motion sickness" stimulating Migraines with photophobia - a day after being on the road. No nausea, no vomiting, no vertigo. She feels her headaches have been better since starting on CPAP . Her elevated hemoglobin has come down, another effect of CPAP therapy. MRI brain was normal.  Mrs. Spruiell had a history of feeling very fatigued during a time when she suffered from hypothyroidism.   This has improved significantly and she is not excessively daytime sleepy now endorsing the Epworth Sleepiness Scale at 3 points.    Her sleep study was quoted below it was a home sleep test from 12 December 2020.  Strong REM accentuated mild sleep apnea overall AHI was 14.1/h REM AHI 34.4/h no positional data.  Compliance for CPAP is excellent the patient has used the machine for 88% of days and 20 out of 30 days over 4 hours consecutively.  The mean pressure was 6.6 cmH2O with an average pressure of 8.1 cmH2O for the 95th percentile.  She is using an interface that gives good coverage there is almost no air leak noted, the average AHI is now 0.8/h.  The machine is set between 6 and 16 cmH2O but the ramp starts  at 4 cmH2O.  Over the last 10 days there has been an AHI trend to increase with a residual AHI.  It still only about 2.5/h and that is still a good resolution.  The leaks have decreased, and the maximum pressure seems to be constant.  The patient reports condensation water in the tube , bubbling.  To the patient does feel that she has had benefit from using CPAP.  I am thinking that especially the decrease in hemoglobin is an indication that it helped.     MM: [name] is a 81 year old female with a history of migraine headaches.  She states that she has approximately 5 headaches a week.  She states that the last 4 days she is having good days.  She sometimes wakes up with a headache.  Husband states that she always wakes up not feeling good.  Although since  she has started CPAP she has not been as sleepy.  She is currently on Ajovy.  She has tried multiple other medications in the past including Botox.  She is not interested in IV medication for migraines.  She returns today for an evaluation.  Her CPAP download indicates that she use her machine 24 out of 30 days for compliance of 80%.  She used her machine greater than 4 hours 20 days for compliance of 67%.  On average she uses her machine 6 hours and 10 minutes.  Her residual AHI is 1.4 on 5 to 17 cm of water with EPR of 3.  Leak in the 95th percentile is 1.2 L/min.    HISTORY 11/12/20: [name] is a 84 - year- old Caucasian female patient seen here as a referral on 11/12/2020 from Dr Percell Belt. She has been sleeping well  on benadryl but has ongoing Migraine concern. Chief concern according to patient : I have been woken by headaches and woken with headachees. On Ajovy now , I have less of these until the end of the month. My headache began when I was pregnant the first time, and they stayed with me. I only find relief in Cafergot. I am a thyroid cancer survivor". She reports that her doctor had ordered a sleep study in [place], and she waited for an hour, and nobody opened the door to the lab.  She also reported her husband ond one of her adult daughters have witnessed crescendo snoring and apnea.     I have the pleasure of seeing [name] Hence today, a right-handed  Caucasian female with a possibly sleep related headache disorder.  She is  a patient of dr Jaynee Eagles, MD.  She has a past medical history of Hashimoto's thyroiditis, Hypothyroidism, Migraines, and Thyroid cancer ([place]).    Sleep relevant medical history: usually headaches set on at 11 AM, and she feels best at night time.   Family medical /sleep history: strong family history of migraine, has 3 children, with not as severe headaches I her children.   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, and lives in a household with spouse, has adult children, owns one dog.  Tobacco use; never .  ETOH use ; none , Caffeine intake in form of Coffee( sometimes ) Soda( sometimes)  drinks. Regular exercise in form of walking- never feel enough energy to work out.     Sleep habits are as follows: The patient's dinner time is between 5PM. The patient goes to bed at 11PM and  continues to sleep for 3-5 hours, wakes for 2 bathroom breaks.  The bedroom is 65 degrees , dark and quiet.  The preferred sleep position is prone or sideways, with the support of 1 pillow.  Dreams are reportedly frequent.  7.30 AM is the usual rise time, she takes thyroid medication at 5.30 . The patient wakes up with an alarm.  She reports not feeling refreshed or restored in AM, with symptoms such as dry mouth, morning headaches, and residual fatigue. Naps are taken infrequently now , but for years she used to take daily naps.   REVIEW OF SYSTEMS: Out of a complete 14 system review of symptoms, the patient complains only of the following symptoms, and all other reviewed systems are negative.   I suggested that we should try an auto titration CPAP.  She is still has headaches, she has a past medical history of Hashimoto thyroiditis hypothyroidism therefore migraines and thyroid cancer. CPAP caused her to cough, which stopped after she quit using CPAP. She is now on an oral appliance but this caused  jaw pain.  She is still presenting with HA.  We will check her Epworth sleepiness score to her currently is 4 points, her fatigue severity score is now   63 / 63 points. She would like to discuss inspire, but I explained her AHI is too low to justify that. I rather would  supplement her iron  more effectively and follow her fatigue score.   ALLERGIES: Allergies  Allergen Reactions   Atorvastatin     Other reaction(s): Myalgias (muscle pain) Other reaction(s): Myalgias (intolerance)   Paxil [Paroxetine Hcl] Other (See Comments)     syncope   Rosuvastatin Calcium Other (See Comments)   Statins    Sulfamethoxazole Other (See Comments)   Zonisamide Other (See Comments)    HOME MEDICATIONS: Outpatient Medications Prior to Visit  Medication Sig Dispense Refill   amLODipine (NORVASC) 5 MG tablet Take 2.5 mg by mouth daily.     ergotamine-caffeine (CAFERGOT) 1-100 MG tablet ergotamine 1 mg-caffeine 100 mg tablet  1/2 TABLET FOR HEADACHE / WOULD NOT USE MORE THAN 1/2 TABLET IN 24 HOURS     ferrous sulfate 325 (65 FE) MG tablet Take 325 mg by mouth daily.     ibuprofen (ADVIL) 200 MG tablet Take by mouth.     predniSONE (DELTASONE) 10 MG tablet Take 10 mg by mouth 2 (two) times daily. Pt currently taking daily     TIROSINT 125 MCG CAPS Take 125 mcg by mouth daily.      Ibuprofen-diphenhydrAMINE Cit 200-38 MG TABS Advil PM 200 mg-38 mg tablet  Take by oral route.     lisinopril (ZESTRIL) 5 MG tablet Take 1 tablet by mouth as needed.     metoprolol succinate (TOPROL-XL) 25 MG 24 hr tablet Take 1 tablet by mouth 3 (three) times daily.     traZODone (DESYREL) 50 MG tablet TAKE 1 TABLET BY MOUTH AT BEDTIME AS NEEDED FOR SLEEP. 90 tablet 1   Facility-Administered Medications Prior to Visit  Medication Dose Route Frequency Provider Last Rate Last Admin   Erenumab-aooe SOAJ 140 mg  140 mg Subcutaneous Once Melvenia Beam, MD        PAST MEDICAL HISTORY: Past Medical History:  Diagnosis Date   Hashimoto's thyroiditis    Hypothyroidism    Migraines    Thyroid cancer ([place])     PAST SURGICAL HISTORY: Past Surgical History:  Procedure Laterality Date   APPENDECTOMY  BREAST CYST ASPIRATION Right 2014   BREAST EXCISIONAL BIOPSY Left 2008   benign   cataract surgery Bilateral 2020   CESAREAN SECTION     CHOLECYSTECTOMY     FOOT SURGERY     THYROIDECTOMY     uterine fibroid removal      FAMILY HISTORY: Family History  Problem Relation Age of Onset   Headache Mother        occasional   Headache Sister     Rheum arthritis Sister    Headache Maternal Aunt        took many medications    Headache Paternal Grandmother    Headache Cousin    Headache Niece    Headache Other    Stroke Neg Hx    Breast cancer Neg Hx     SOCIAL HISTORY: Social History   Socioeconomic History   Marital status: Married    Spouse name: Not on file   Number of children: Not on file   Years of education: Not on file   Highest education level: Not on file  Occupational History   Not on file  Tobacco Use   Smoking status: Never   Smokeless tobacco: Never   Tobacco comments:    second hand exposure at work  Scientific laboratory technician Use: Never used  Substance and Sexual Activity   Alcohol use: Never   Drug use: Never   Sexual activity: Not on file  Other Topics Concern   Not on file  Social History Narrative   Lives at home with husband   Right handed   Caffeine: 1 cup some days   Social Determinants of Health   Financial Resource Strain: Not on file  Food Insecurity: Not on file  Transportation Needs: Not on file  Physical Activity: Not on file  Stress: Not on file  Social Connections: Not on file  Intimate Partner Violence: Not on file   [place], [place] Fract 24 Hr (Sendout) (07/02/2021 11:31 AM EDT) Lab Results - Metanephrine, UR Fract 24 Hr (Sendout) (07/02/2021 11:31 AM EDT) Component Value Ref Range Test Method Analysis Time Performed At Pathologist Signature  Normetanephrine, U 118 Undefined ug/L   07/08/2021 10:35 PM EDT [facility]    Normetanephrine, 24H Ur 212 131 - 612 ug/24 hr   07/08/2021 10:35 PM EDT [facility]    Metanephrine, U 20 Undefined ug/L   07/08/2021 10:35 PM EDT [facility]    U Metanephrine/24Hr 36 36 - 209 ug/24 hr   07/08/2021 10:35 PM EDT [facility]     Lab Results - Metanephrine, UR Fract 24 Hr (Sendout) (07/02/2021 11:31 AM EDT) Specimen (Source) Anatomical Location / Laterality Collection Method / Volume Collection Time Received Time  Urine     07/02/2021 11:31 AM EDT  07/02/2021 11:31 AM EDT   Lab Results - Metanephrine, UR Fract 24 Hr (Sendout) (07/02/2021 11:31 AM EDT) Narrative  Maryan Puls - 07/08/2021 10:35 PM EDT   Test(s) 004240-Normetanephrine, Ur; 004239-Metanephrine, Ur was developed and its performance characteristics determined by [facility]. It has not been cleared or approved by the Food and Drug Administration. Performed at:  [address], [place], [place]  [id number] Lab Director: Rush Farmer MD, Phone:  [phone]     Lab Results - Metanephrine, UR Fract 24 Hr (Sendout) (07/02/2021 11:31 AM EDT) Authorizing Provider Result Type  Meagan Holland Falling ANP URINE ORDERABLES   Lab Results - Metanephrine, UR Fract 24 Hr (Sendout) (07/02/2021 11:31 AM EDT) Performing Organization Address City/State/ZIP Code  Phone Number  [email or web address]            Back to top of Lab Results    Catechol, UR Frac 24 Hr (Sendout) (07/02/2021 11:31 AM EDT) Lab Results - Catechol, UR Frac 24 Hr (Sendout) (07/02/2021 11:31 AM EDT) Component Value Ref Range Test Method Analysis Time Performed At Pathologist Signature  EPINEPHRINE,UR 1 Undefined ug/L   07/09/2021 4:36 PM EDT [facility]    EPINEPHRINE 24 HOUR URINE 2 0 - 20 ug/24 hr   07/09/2021 4:36 PM EDT [facility]    NOREPINEPHRINE,UR 30 Undefined ug/L   07/09/2021 4:36 PM EDT [facility]    NOREPINEPHRINE 24 HOUR URINE 56 0 - 135 ug/24 hr   07/09/2021 4:36 PM EDT [facility]    DOPAMINE,UR 98 Undefined ug/L   07/09/2021 4:36 PM EDT [facility]    DOPAMINE 24 HOUR URINE 182 0 - 510 ug/24 hr   07/09/2021 4:36 PM EDT [facility]     Lab Results - Catechol, UR Frac 24 Hr (Sendout) (07/02/2021 11:31 AM EDT) Specimen (Source) Anatomical Location / Laterality Collection Method / Volume Collection Time Received Time  Urine     07/02/2021 11:31 AM EDT 07/02/2021 11:31 AM EDT   Lab Results - Catechol, UR Frac 24 Hr (Sendout) (07/02/2021 11:31 AM EDT) Narrative  Maryan Puls - 07/09/2021 4:36 PM EDT   Test(s)  665993-TTSVXBLTJQZ, Urine; 004201-Norepinephrine, Ur; 009233- Dopamine, Urine was developed and its performance characteristics determined by [facility]. It has not been cleared or approved by the Food and Drug Administration. Performed at:  [address] [place], [place]  [id number] Lab Director: Rush Farmer MD, Phone:  [phone]     Lab Results - Catechol, UR Frac 24 Hr (Sendout) (07/02/2021 11:31 AM EDT) Authorizing Provider Result Type     PHYSICAL EXAM General: The patient is awake, alert and appears not in acute distress. The patient is well groomed. Head: Normocephalic, atraumatic. Neck is supple. Mallampati 2, lacquered tongue and red throat.  neck circumference:16.5  inches . Nasal airflow patent.   Retrognathia is not seen.  Dental status: native  Cardiovascular:  Regular rate and cardiac rhythm by pulse,  without distended neck veins. Respiratory: Lungs are clear to auscultation.  Skin:  Without evidence of ankle edema, or rash. Trunk: The patient's posture is erect.   Neurologic exam : The patient is awake and alert, oriented to place and time.   Memory subjective described as intact.  Attention span & concentration ability appears normal.  Speech is fluent,  without dysarthria, dysphonia or aphasia.  Mood and affect are appropriate.   Cranial nerves: no loss of smell or taste reported  Pupils are equal and briskly reactive to light. Funduscopic exam deferred. .  Extraocular movements in vertical and horizontal planes were intact and without nystagmus. No Diplopia. Visual fields by finger perimetry are intact. Hearing was intact to soft voice and finger rubbing. Facial sensation intact to fine touch. Facial motor strength is symmetric and tongue and uvula move midline. Neck ROM : rotation, tilt and flexion extension were normal for age and shoulder shrug was symmetrical.    Motor exam:  Symmetric bulk, tone and ROM.   Normal tone without  cogwheeling, and with symmetric grip strength .   Sensory:  Fine touch, pinprick and vibration were tested  and  normal.  Proprioception tested in the upper extremities was normal.   Coordination: Rapid alternating movements in the fingers/hands were of normal speed.  The Finger-to-nose maneuver was intact.  Gait and station: Patient could rise unassisted from a seated position, walked without assistive device.  Stance is of normal width/ base and the patient turned with 3 steps.  Toe and heel walk were deferred.  Deep tendon reflexes: in the  upper and lower extremities are symmetric and intact.  Babinski response was deferred.     Vitals:   05/13/22 1314  BP: (!) 177/98  Pulse: 84  Weight: 180 lb 8 oz (81.9 kg)  Height: _0  (1.575 m)   Body mass index is 33.01 kg/m.   DIAGNOSTIC DATA (LABS, IMAGING, TESTING) - I reviewed patient records, labs, notes, testing and imaging myself where available.  Compliance for CPAP is excellent the patient has used the machine for 88% of days and 20 out of 30 days over 4 hours consecutively.  The mean pressure was 6.6 cmH2O with an average pressure of 8.1 cmH2O for the 95th percentile.  She is using an interface that gives good coverage there is almost no air leak noted, the average AHI is now 0.8/h.  The machine is set between 6 and 16 cmH2O but the ramp starts at 4 cmH2O.  Over the last 10 days there has been an AHI trend to increase with a residual AHI.  It still only about 2.5/h and that is still a good resolution.  The leaks have decreased, and the maximum pressure seems to be constant.  The patient reports condensation water in the tube , bubbling.  To the patient does feel that she has had benefit from using CPAP.  I am thinking that especially the decrease in hemoglobin is an indication that it helped.  IMPRESSION: This HST confirmed the presence of mild - moderate OSA (obstructive sleep apnea) with moderate-loud snoring . This apnea is  strongly accentuated in REM sleep. Positional data were not available.   Hypoxia was too short to be of clinical significance.    RECOMMENDATION:  Auto CPAP would be recommended to treat this form of Sleep Apnea and will correct low oxygen levels, apneas and snoring. I recommend a setting of 5-17 cm water, 3 cm EPR and heated humidification. The patient should choose a mask of her comfort and be instructed to address concerns about the interface ASAP with the DME.         INTERPRETING PHYSICIAN:  Larey Seat, MD  [facility]        Electronically signed by Larey Seat, MD at 12/20/2020  2:30 PM  Lab Results  Component Value Date   HGB 15.3 (H) 06/13/2007      Component Value Date/Time   NA 139 06/08/2007 1430   K 4.5 06/08/2007 1430   CL 109 06/08/2007 1430   CO2 27 06/08/2007 1430   GLUCOSE 96 06/08/2007 1430   BUN 16 06/08/2007 1430   CREATININE 0.70 06/08/2007 1430   CALCIUM 9.0 06/08/2007 1430   GFRNONAA >60 06/08/2007 1430   GFRAA  06/08/2007 1430    >60        The eGFR has been calculated using the MDRD equation. This calculation has not been validated in all clinical      ASSESSMENT AND PLAN 81 y.o. year old female  has a past medical history of Hashimoto's thyroiditis, Hypothyroidism, Migraines, and Thyroid cancer ([place]). here with :   1.  Headaches/ fatigue, iron deficiency with RLS/ OSA untreated.   She discontinued the Aimovig, has not started  Ajovy for now, metoprolol has been a good preventer.  Discussed restarting CPAP, ordered SPLIT at AHI 10.       Fatigue is excessive 63/ 63 points, is that related to low iron? To thyroid cancer/ to untreated apnea?  I ordered a repeat ferrous metabolism lab- an may order iv iron .     2.  Obstructive sleep apnea on CPAP.  borderline compliance, but room for improvement - should use CPAP over 4 hours nightly, but isn'Terry.  We discussed starting use CPAP during NAPS.  Her condensation water problem has been contributing to poor compliance will reset her humidifier to lower settings. She  keeps her bedroom at [address].    Low Xanax for in lab sleep study- offered. She felt poorly on melatonin.  Will try CPAP again.  Advised that with continued compliance headaches may continue to improve?  Follow-up in 4 months with NP or dr Jaynee Eagles,  or sooner if needed.    Larey Seat, MD  05/13/2022, 1:53 PM [facility] [address] [place], [place] [address] [phone]  agree with assessment and plan as stated.     Sarina Ill, MD [facility]

## 2022-05-13 NOTE — Patient Instructions (Signed)

## 2022-05-14 ENCOUNTER — Telehealth: Payer: Self-pay | Admitting: Neurology

## 2022-05-14 NOTE — Telephone Encounter (Signed)
[employer] pending uploaded notes on the portal  

## 2022-05-17 LAB — IRON,TIBC AND FERRITIN PANEL
Ferritin: 41 ng/mL (ref 15–150)
Iron Saturation: 42 % (ref 15–55)
Iron: 130 ug/dL (ref 27–139)
Total Iron Binding Capacity: 313 ug/dL (ref 250–450)
UIBC: 183 ug/dL (ref 118–369)

## 2022-05-17 LAB — TRANSFERRIN SATURATION
IRON SATN MFR SERPL: 37 % Saturation
IRON SERPL-MCNC: 137 ug/dL
TRANSFERRIN SERPL-MCNC: 262 mg/dL

## 2022-05-18 ENCOUNTER — Telehealth: Payer: Self-pay | Admitting: *Deleted

## 2022-05-18 NOTE — Telephone Encounter (Signed)
-----   Message from Larey Seat, MD sent at 05/17/2022  4:22 PM EDT ----- Iron metabolites in normal range.

## 2022-05-18 NOTE — Telephone Encounter (Signed)
Called and spoke w/ pt about results per Dr. Edwena Felty note. Pt verbalized understanding.

## 2022-05-26 NOTE — Telephone Encounter (Signed)
SPLIT(MD requested Split with AHI of 10/hr)(auth#[id number] 7/13-8/10/2022).  Patient is scheduled at [facility] for 06/08/22 at 9 pm. I mailed packet to the patient.

## 2022-06-01 DIAGNOSIS — M25562 Pain in left knee: Secondary | ICD-10-CM | POA: Diagnosis not present

## 2022-06-02 DIAGNOSIS — Z6833 Body mass index (BMI) 33.0-33.9, adult: Secondary | ICD-10-CM | POA: Diagnosis not present

## 2022-06-02 DIAGNOSIS — M25562 Pain in left knee: Secondary | ICD-10-CM | POA: Diagnosis not present

## 2022-06-02 DIAGNOSIS — G43109 Migraine with aura, not intractable, without status migrainosus: Secondary | ICD-10-CM | POA: Diagnosis not present

## 2022-06-08 ENCOUNTER — Ambulatory Visit (INDEPENDENT_AMBULATORY_CARE_PROVIDER_SITE_OTHER): Payer: Medicare PPO | Admitting: Neurology

## 2022-06-08 DIAGNOSIS — R519 Headache, unspecified: Secondary | ICD-10-CM

## 2022-06-08 DIAGNOSIS — E890 Postprocedural hypothyroidism: Secondary | ICD-10-CM

## 2022-06-08 DIAGNOSIS — G4733 Obstructive sleep apnea (adult) (pediatric): Secondary | ICD-10-CM

## 2022-06-08 DIAGNOSIS — G2581 Restless legs syndrome: Secondary | ICD-10-CM

## 2022-06-08 DIAGNOSIS — C73 Malignant neoplasm of thyroid gland: Secondary | ICD-10-CM

## 2022-06-08 DIAGNOSIS — R530 Neoplastic (malignant) related fatigue: Secondary | ICD-10-CM

## 2022-06-16 NOTE — Procedures (Signed)
[facility] POLYSOMNOGRAPHY  INTERPRETATION REPORT   STUDY DATE:  06/08/2022     PATIENT NAME:  [name]         DATE OF BIRTH:  June 01, 1941  PATIENT ID:  [id number]    TYPE OF STUDY:  SPLIT  READING PHYSICIAN: Larey Seat, MD SCORING TECHNICIAN: Forde Radon, RPSGT  REFERRING : Dr. Sarina Ill, MD   HISTORY:  05-13-2022: RV with Ivis T Scow, who is here for follow up on her headaches, her fatigue and RLS. She was diagnosed with low ferritin, has thyroid disease. She has used Ferrous sulfate orally but  this was not well tolerated. She is interested in an alternative to CPAP is she still needs CPAP ? Marland Kitchen Sleep is reportedly shallow-but she denies snoring. The patient underwent a home sleep test in February 2022, by the time she had endorsed the Epworth sleepiness score at 5 out of 24 points, her fatigue severity score was endorsed at 63 / 63 points. BMI was 32.9.  She slept for 7 hours 20 minutes with about 17% REM sleep. She had an oxygen nadir of 79% but only 2 minutes of desaturation time. She presented with an overall AHI of 14.1/h which is very mild apnea. REM sleep however exacerbated the AHI  to 34.4/h. ?I suggested that we should try an auto titration CPAP. She is still has headaches, she has a past medical history of Hashimoto thyroiditis hypothyroidism therefore migraines and thyroid cancer.  CPAP caused her to cough, and coughing stopped after she quit using CPAP.  She is now on an oral appliance but this caused jaw pain. She would like to discuss inspire, but I explained her AHI is too low to justify that. I rather would supplement her iron more effectively and follow her fatigue score. I will retest her level of sleep apnea.   ADDITIONAL INFORMATION:  Height: 62 " Weight: 180 lb (BMI 32) Neck Size: 17 ". MEDICATION: Xanax, Norvasc, Erenumab, Cafergot, Ferrous Sulfate, Advil, Tirosint, Deltasone  DESCRIPTION: A RPSGT (sleep technologist )was  in attendance for the duration of the recording.  Data collection, scoring, video monitoring, and reporting were performed in compliance with the AASM Manual for the Scoring of Sleep and Associated Events; (Hypopnea is scored based on the criteria listed in Section VIII D. 1b in the AASM Manual V2.6 using a 4% oxygen desaturation rule or Hypopnea is scored based on the criteria listed in Section VIII D. 1a in the AASM Manual V2.6 using 3% oxygen desaturation and /or arousal rule).  A physician certified by the American Board of Sleep Medicine reviewed each epoch of the study.  SLEEP CONTINUITY AND SLEEP ARCHITECTURE:  Lights off was at 21:39: and lights on 04:44: (7.1 hours in bed). Total sleep time was 232.5 minutes with a decreased sleep efficiency at 54.7%.  BODY POSITION: Duration of total sleep and percent of total sleep in their respective position is as follows: Supine position 00 minutes (0%), non-supine 233 minutes (100%); right 21 minutes (9%), left 211 minutes (91%), and prone 00 minutes (0%). Sleep latency was increased at 50.5 minutes.  REM sleep void sleep study. The percentage of sleep stages were : stage N1 sleep was 5.4%, stage N2 sleep was 52%, stage N3 sleep was 43.0%, and REM sleep was 0.0%. There were 3 awakenings (i.e. transitions to Stage W from any sleep stage), and 18 total stage transitions recorded. Wake after sleep onset (WASO) time accounted for 142 minutes(!) .  RESPIRATORY MONITORING: Based on 3 and 4% oxygen desaturation and /or arousal rule for scoring hypopneas), there were 2 apneas (2 obstructive; 0 central; 0 mixed), and 7 hypopneas. Apnea index was 0.5/h. Hypopnea index was 1.8/h. The overall AHI apnea-hypopnea index was 2.3/h - all in NREM, non-supine sleep.  There were 0 respiratory effort-related arousals (RERAs).   Total respiratory disturbance index (RDI) was 2.3/h. RDI results were identical to AHI : all  non-supine, all NREM  RDI 2.3 /h.  Respiratory events were  associated with oxyhemoglobin desaturations (nadir during sleep 88% and duration of hypoxemia was  ) from a mean of 94%).  OXIMETRY: Total sleep time spent at, or below 88% was 0.5 minutes, or 0.2% of total sleep time. Snoring was classified as mild. LIMB MOVEMENTS: There were 356 periodic limb movements of sleep (91.9/h), of which 5 (1.3/h) were associated with an arousal. AROUSALS: There were 22 arousals in total, for an arousal index of 6/h. Of these, 2 were identified as respiratory-related arousals (1 /h), 5 were PLM-related arousals (1 /h), and 18 were non-specific (non- physiological) arousals (5 /h). EEG: Symmetric and of normal amplitude, normal frequency without epileptiform activity.   EKG: The average heart rate during sleep was 66 bpm.  The maximum heart rate during sleep was 85 bpm. The maximum heart rate during recording was 86 bpm with isolated PVCs.  AUDIO AND VIDEO RECORDING: no vocalizations, nor complex movements or dream enactment were recorded.     IMPRESSION:   0) Insomnia.The original order for SPLIT protocol study could not be initiated due to low AHI. The patient presented with inability to initiate and maintain sleep. Total sleep time was reduced at 232.5 minutes.  Sleep efficiency was decreased at 54.7%  1) The patient presented with severe PLMD- periodic limb movement of sleep, causing arousals.   2) This sleep study did not identify OSA at a clinically significant level  and CPAP intervention would not be indicated. this PSG may underestimate the severity of sleep apnea as neither supine sleep nor REM were observed.    RECOMMENDATION: We will not pursue CPAP or any other intervention for a clinically irrelevant degree of sleep apnea. the patient avoided supine sleep.  I plan to follow up on RLS/ PLMD medication.  Larey Seat, MD Medical Director of [facility]. Board certified in [place] [employer] ([place]) and Neurology (ABPN).

## 2022-06-17 ENCOUNTER — Telehealth: Payer: Self-pay | Admitting: Neurology

## 2022-06-17 DIAGNOSIS — M170 Bilateral primary osteoarthritis of knee: Secondary | ICD-10-CM | POA: Diagnosis not present

## 2022-06-17 DIAGNOSIS — M1712 Unilateral primary osteoarthritis, left knee: Secondary | ICD-10-CM | POA: Diagnosis not present

## 2022-06-17 NOTE — Telephone Encounter (Signed)
-----   Message from Larey Seat, MD sent at 06/16/2022  6:09 PM EDT ----- IMPRESSION:   0) Insomnia.The original order for SPLIT protocol study could not be initiated due to low AHI. The patient presented with inability to initiate and maintain sleep. Total sleep time was reducedat 232.40mnutes. Sleep efficiency was decreasedat 54.7%  1) The patient presented with severe PLMD- periodic limb movement of sleep, causingarousals.   2) This sleep study did not identify OSA at a clinically significant level  and CPAP intervention would not be indicated. this PSG may underestimate the severity of sleep apnea as neither supine sleep nor REM were observed.   RECOMMENDATION: We will not pursue CPAP or any other intervention for a clinically irrelevant degree of sleep apnea. the patient avoided supine sleep.  I plan to follow up on RLS/ PLMD medication.  CarmenDohmeier,MD Medical Director of [facility] Board certified in [place] (ABPN).

## 2022-06-17 NOTE — Telephone Encounter (Signed)
Pt is asking for a call back from [place], [place]

## 2022-06-17 NOTE — Telephone Encounter (Signed)
Called patient to discuss sleep study results. No answer at this time. LVM for the patient to call back.   

## 2022-06-18 NOTE — Telephone Encounter (Signed)
Called the patient and reviewed the sleep study results with the patient. Dr Dohmeier was not concerned with the pt having apnea concerns and would not need to worry about cpap treatment. The study indicated there was some PLMD and frequent arousals r/t that. She states that she never knew that she did that. She did not enter in REM stage of sleep. Pt normally takes tylenol pm at home and sleeps well when she uses that. She has not felt that the restlessness has been a part of her waking up. Advised if that becomes bothersome she can discuss medications to help with that with Dr Brett Fairy. Offered a f/u visit and at this time she declines

## 2022-06-23 ENCOUNTER — Encounter: Payer: Self-pay | Admitting: Neurology

## 2022-06-23 ENCOUNTER — Ambulatory Visit: Payer: Medicare PPO | Admitting: Neurology

## 2022-06-23 VITALS — BP 146/91 | HR 103 | Ht 62.0 in | Wt 179.0 lb

## 2022-06-23 DIAGNOSIS — G4761 Periodic limb movement disorder: Secondary | ICD-10-CM

## 2022-06-23 DIAGNOSIS — R530 Neoplastic (malignant) related fatigue: Secondary | ICD-10-CM | POA: Diagnosis not present

## 2022-06-23 MED ORDER — PRAMIPEXOLE DIHYDROCHLORIDE 0.25 MG PO TABS: 0.2500 mg | ORAL_TABLET | Freq: Every day | ORAL | 1 refills | Status: DC

## 2022-06-23 NOTE — Progress Notes (Signed)
SLEEP MEDICINE CLINIC   PATIENT: [name] DOB: 04-21-1941  REASON FOR VISIT: follow up on PSG. HISTORY FROM: patient PRIMARY NEUROLOGIST: Dr Melinda Eagles, MD   *-22-2013; RV with [name], 81 years-old, here to follow up on recent PSG , 06-09-2022. There was no sustained respiratory problem, no need for CPAP- but there was insomnia.   the patient had frequent PLMs but she felt she slept well, and we don't have any account of hers about ongoing RLS symptoms - we could leave this untreated. Her complaint is fatigue and un-restorative sleep- and she has worked on her low iron and is now in normal range for ferritin and TIBC- can continue QOD with oral iron.  Suggested a tral of Mirapex, low dose to see if her sleep  quality improves- she likes to go that route.   Epworth sleepiness score at 5 out of 24 points, her fatigue severity score was endorsed at 63 / 63 points. BMI was 32.9.   05-13-2022: RV with [name], here for follow up on headaches, fatigue and she has a history of RLS. She was diagnosed with low ferritin, Thyroid disease.  She has used iron supplement by mouth. Ferrous sulfate orally was not well tolerated. She is interested in an alternative. Sleep is reportedly shallow- not snoring. The patient underwent a home sleep test in February 2022 by the time had endorsed the Epworth sleepiness score 5 out of 24 points BMI was 32.9 she slept for 7 hours 20 minutes with about 17% REM sleep.  She had an oxygen nadir of 79% but only 2 minutes of desaturation time.  She presented with an overall AHI of 14.1 which is very mild apnea REM sleep however exacerbated to 34.4/h.  I suggested that we should try an auto titration CPAP.  She is still has headaches, she has a past medical history of Hashimoto thyroiditis hypothyroidism therefore migraines and thyroid cancer. CPAP caused her to cough, which stopped after she quit using CPAP. She is now on an oral appliance but this  caused  jaw pain.  She is still presenting with HA.   We will check her Epworth sleepiness score to her currently is 4 points, her fatigue severity score is now   63 / 63 points. She would like to discuss inspire, but I explained her AHI is too low to justify that. I rather would  supplement her iron  more effectively and follow her fatigue score.  She may have coughing due to ergotamine, which can cause a fibrosis.   I would like for her to try PAP again. I order an in-lab study. I offer a sleep aid.      09-03-2021: Interval history : [name] has had 2 Emergency room visits, had negative Echo and EKG and enzymes, has each time presented with high blood pressures in the 180-100 mmHg range.  Headaches with it, no vertigo. Metoprolol had helped HA and HTN.  She has had bouts of "motion sickness" stimulating Migraines with photophobia - a day after being on the road. No nausea, no vomiting, no vertigo. She feels her headaches have been better since starting on CPAP . Her elevated hemoglobin has come down, another effect of CPAP therapy. MRI brain was normal.  [name] had a history of feeling very fatigued during a time when  she suffered from hypothyroidism.   This has improved significantly and she is not excessively daytime sleepy now endorsing the Epworth Sleepiness Scale at 3 points.    Her sleep study was quoted below it was a home sleep test from 12 December 2020.  Strong REM accentuated mild sleep apnea overall AHI was 14.1/h REM AHI 34.4/h no positional data.  Compliance for CPAP is excellent the patient has used the machine for 88% of days and 20 out of 30 days over 4 hours consecutively.  The mean pressure was 6.6 cmH2O with an average pressure of 8.1 cmH2O for the 95th percentile.  She is using an interface that gives good coverage there is almost no air leak noted, the average AHI is now 0.8/h.  The machine is set between 6 and 16 cmH2O but the ramp starts at 4 cmH2O.  Over the  last 10 days there has been an AHI trend to increase with a residual AHI.  It still only about 2.5/h and that is still a good resolution.  The leaks have decreased, and the maximum pressure seems to be constant.  The patient reports condensation water in the tube , bubbling.  To the patient does feel that she has had benefit from using CPAP.  I am thinking that especially the decrease in hemoglobin is an indication that it helped.     MM: [name] is a 81 year old female with a history of migraine headaches.  She states that she has approximately 5 headaches a week.  She states that the last 4 days she is having good days.  She sometimes wakes up with a headache.  Husband states that she always wakes up not feeling good.  Although since she has started CPAP she has not been as sleepy.  She is currently on Ajovy.  She has tried multiple other medications in the past including Botox.  She is not interested in IV medication for migraines.  She returns today for an evaluation.  Her CPAP download indicates that she use her machine 24 out of 30 days for compliance of 80%.  She used her machine greater than 4 hours 20 days for compliance of 67%.  On average she uses her machine 6 hours and 10 minutes.  Her residual AHI is 1.4 on 5 to 17 cm of water with EPR of 3.  Leak in the 95th percentile is 1.2 L/min.    HISTORY 11/12/20: [name] is a 23 - year- old Caucasian female patient seen here as a referral on 11/12/2020 from Dr [name]. She has been sleeping well  on benadryl but has ongoing Migraine concern. Chief concern according to patient : I have been woken by headaches and woken with headachees. On Ajovy now , I have less of these until the end of the month. My headache began when I was pregnant the first time, and they stayed with me. I only find relief in Cafergot. I am a thyroid cancer survivor". She reports that her doctor had ordered a sleep study in [place], and she waited for an hour, and  nobody opened the door to the lab.  She also reported her husband ond one of her adult daughters have witnessed crescendo snoring and apnea.     I have the pleasure of seeing [name] today, a right-handed  Caucasian female with a possibly sleep related headache disorder.  She is  a patient of dr Melinda Eagles, MD.  She has a past medical history of  Hashimoto's thyroiditis, Hypothyroidism, Migraines, and Thyroid cancer ([place]).    Sleep relevant medical history: usually headaches set on at 11 AM, and she feels best at night time.   Family medical /sleep history: strong family history of migraine, has 3 children, with not as severe headaches I her children.   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  Tobacco use; never .  ETOH use ; none , Caffeine intake in form of Coffee( sometimes ) Soda( sometimes)  drinks. Regular exercise in form of walking- never feel enough energy to work out.     Sleep habits are as follows: The patient's dinner time is between 5PM. The patient goes to bed at 11PM and continues to sleep for 3-5 hours, wakes for 2 bathroom breaks.  The bedroom is 65 degrees , dark and quiet.  The preferred sleep position is prone or sideways, with the support of 1 pillow.  Dreams are reportedly frequent.  7.30 AM is the usual rise time, she takes thyroid medication at 5.30 . The patient wakes up with an alarm.  She reports not feeling refreshed or restored in AM, with symptoms such as dry mouth, morning headaches, and residual fatigue. Naps are taken infrequently now , but for years she used to take daily naps.   REVIEW OF SYSTEMS: Out of a complete 14 system review of symptoms, the patient complains only of the following symptoms, and all other reviewed systems are negative.   I suggested that we should try an auto titration CPAP.  She is still has headaches, she has a past  medical history of Hashimoto thyroiditis hypothyroidism therefore migraines and thyroid cancer. CPAP caused her to cough, which stopped after she quit using CPAP. She is now on an oral appliance but this caused  jaw pain.  She is still presenting with HA.  Thyroid cancer.  Insomnia.  We will check her Epworth sleepiness score to her currently is 4 points, her fatigue severity score was in July 2023 at   63 / 63 points.  How likely are you to doze in the following situations: 0 = not likely, 1 = slight chance, 2 = moderate chance, 3 = high chance  Sitting and Reading? Watching Television? Sitting inactive in a public place (theater or meeting)? Lying down in the afternoon when circumstances permit? Sitting and talking to someone? Sitting quietly after lunch without alcohol? In a car, while stopped for a few minutes in traffic? As a passenger in a car for an hour without a break?  Total = 4/ 24 FSS at  46 / 63 points , 06-23-2022  ALLERGIES: Allergies  Allergen Reactions   Atorvastatin     Other reaction(s): Myalgias (muscle pain) Other reaction(s): Myalgias (intolerance)   Paxil [Paroxetine Hcl] Other (See Comments)    syncope   Rosuvastatin Calcium Other (See Comments)   Statins    Sulfamethoxazole Other (See Comments)   Zonisamide Other (See Comments)    HOME MEDICATIONS: Outpatient Medications Prior to Visit  Medication Sig Dispense Refill   Cholecalciferol (VITAMIN D3) 50 MCG (2000 UT) capsule Take 2 capsules by mouth daily.     ergotamine-caffeine (CAFERGOT) 1-100 MG tablet ergotamine 1 mg-caffeine 100 mg tablet  1/2 TABLET FOR HEADACHE / WOULD NOT USE MORE THAN 1/2 TABLET IN 24 HOURS     Ferrous Sulfate (IRON PO) Take 20 mg by  mouth every other day.     ibuprofen (ADVIL) 200 MG tablet Take by mouth.     Omega-3 Fatty Acids (OMEGA-3 FISH OIL PO) Take by mouth.     TIROSINT 125 MCG CAPS Take 125 mcg by mouth daily.      ALPRAZolam (XANAX) 0.25 MG tablet Take 1 tablet  (0.25 mg total) by mouth at bedtime as needed for anxiety. 10 tablet 0   amLODipine (NORVASC) 5 MG tablet Take 2.5 mg by mouth daily.     ferrous sulfate 325 (65 FE) MG tablet Take 325 mg by mouth daily.     predniSONE (DELTASONE) 10 MG tablet Take 10 mg by mouth 2 (two) times daily. Pt currently taking daily     Facility-Administered Medications Prior to Visit  Medication Dose Route Frequency Provider Last Rate Last Admin   Erenumab-aooe SOAJ 140 mg  140 mg Subcutaneous Once Melvenia Beam, MD        PAST MEDICAL HISTORY: Past Medical History:  Diagnosis Date   Hashimoto's thyroiditis    Hypothyroidism    Migraines    Thyroid cancer ([place])     PAST SURGICAL HISTORY: Past Surgical History:  Procedure Laterality Date   APPENDECTOMY     BREAST CYST ASPIRATION Right 2014   BREAST EXCISIONAL BIOPSY Left 2008   benign   cataract surgery Bilateral 2020   CESAREAN SECTION     CHOLECYSTECTOMY     FOOT SURGERY     THYROIDECTOMY     uterine fibroid removal      FAMILY HISTORY: Family History  Problem Relation Age of Onset   Headache Mother        occasional   Headache Sister    Rheum arthritis Sister    Headache Maternal Aunt        took many medications    Headache Paternal Grandmother    Headache Cousin    Headache Niece    Headache Other    Stroke Neg Hx    Breast cancer Neg Hx     SOCIAL HISTORY: Social History   Socioeconomic History   Marital status: Married    Spouse name: Not on file   Number of children: Not on file   Years of education: Not on file   Highest education level: Not on file  Occupational History   Not on file  Tobacco Use   Smoking status: Never   Smokeless tobacco: Never   Tobacco comments:    second hand exposure at work  Scientific laboratory technician Use: Never used  Substance and Sexual Activity   Alcohol use: Never   Drug use: Never   Sexual activity: Not on file  Other Topics Concern   Not on file  Social History Narrative   Lives  at home with husband   Right handed   Caffeine: 1 cup some days   Social Determinants of Health   Financial Resource Strain: Not on file  Food Insecurity: Not on file  Transportation Needs: Not on file  Physical Activity: Not on file  Stress: Not on file  Social Connections: Not on file  Intimate Partner Violence: Not on file    PHYSICAL EXAM General: The patient is awake, alert and appears not in acute distress. The patient is well groomed. Head: Normocephalic, atraumatic. Neck is supple. Mallampati 2, lacquered tongue and red throat.  neck circumference:16.5  inches . Nasal airflow patent.   Retrognathia is not seen.  Dental status: native  Cardiovascular:  Regular rate and cardiac rhythm by pulse,  without distended neck veins. Respiratory: Lungs are clear to auscultation.  Skin:  With evidence of ankle edema, no rash. Trunk: The patient's posture is erect.   Neurologic exam : The patient is awake and alert, oriented to place and time.   Memory subjective described as intact.  Attention span & concentration ability appears normal.  Speech is fluent,  without dysarthria, mild dysphonia- thyroid cancer related ? Marland Kitchen  Mood and affect are appropriate.   Cranial nerves: no loss of smell or taste reported  Pupils are equal and briskly reactive to light. Funduscopic exam deferred. .  Extraocular movements in vertical and horizontal planes were intact and without nystagmus. No Diplopia. Visual fields by finger perimetry are intact. Hearing was intact to soft voice and finger rubbing. Facial sensation intact to fine touch. Facial motor strength is symmetric and tongue and uvula move midline. Neck ROM : rotation, tilt and flexion extension were normal for age and shoulder shrug was symmetrical.    Motor exam:  Symmetric bulk, tone and ROM.   Normal tone without cogwheeling, and with symmetric grip strength .   Sensory:  deferred   Coordination: Rapid alternating movements in the  fingers/hands were of normal speed.  The Finger-to-nose maneuver was intact.    Gait and station: deferred.  Deep tendon reflexes: in the upper and lower extremities are symmetric and intact.  Babinski response was deferred.     Vitals:   06/23/22 0824  BP: (!) 146/91  Pulse: (!) 103  Weight: 179 lb (81.2 kg)  Height: '5\' 2"'$  (1.575 m)   Body mass index is 32.74 kg/m.   DIAGNOSTIC DATA (LABS, IMAGING, TESTING) - I reviewed patient records, labs, notes, testing and imaging myself where available.   PSG 06-09-2022 IMPRESSION:    0) Insomnia.The original order for SPLIT protocol study could not be initiated due to low AHI. The patient presented with inability to initiate and maintain sleep. Total sleep time was reduced at 232.5 minutes.  Sleep efficiency was decreased at 54.7%   1) The patient presented with severe PLMD- periodic limb movement of sleep, causing arousals.   2) This sleep study did not identify OSA at a clinically significant level  and CPAP intervention would not be indicated. this PSG may underestimate the severity of sleep apnea as neither supine sleep nor REM were observed.    RECOMMENDATION: We will not pursue CPAP or any other intervention for a clinically irrelevant degree of sleep apnea. the patient avoided supine sleep.  I plan to follow up on RLS/ PLMD medication.      ASSESSMENT AND PLAN 81 y.o. year old female  has a past medical history of Hashimoto's thyroiditis, Hypothyroidism, Migraines, and Thyroid cancer ([place]). here with :   1.  Headaches/ fatigue, iron deficiency  She discontinued the Aimovig, has not started  Ajovy for now, metoprolol has been a good preventer. FSS is reduced to 46 from 63 points / 63 points, is that related to improved  iron?  To thyroid cancer?    2.  No longer Obstructive sleep apnea - see PSG. No follow up in sleep clinic needed.  3. Insomnia- takes tylenol PM. Has frequent PLMs but declared she couldn't feel these.  NO RLS.  Tral of low dose Mirapex to see if sleep quality subjectively improves. 0.25 mg at night for 30 days- I told her to inform us if she sleeps subjectively better or longer. Avoid benadryl in tylenol  PM, use ibuprofen for skeletal pain.   This patient can be followed by PCP and, if HA and neck pain are concerned , by Dr [name] .    Larey Seat, MD  06/23/2022, 8:44 AM [facility] [address], [place], [place] [address] [phone]     Sarina Ill, MD [facility]

## 2022-06-23 NOTE — Patient Instructions (Signed)
Mirapex at 0.25 mg was prescribed for 30 days- this is a trial period and if your sleep has not changed after 30 days( improved level of quality) please discontinue.

## 2022-07-08 DIAGNOSIS — M170 Bilateral primary osteoarthritis of knee: Secondary | ICD-10-CM | POA: Diagnosis not present

## 2022-07-08 DIAGNOSIS — M1711 Unilateral primary osteoarthritis, right knee: Secondary | ICD-10-CM | POA: Diagnosis not present

## 2022-07-08 DIAGNOSIS — M1712 Unilateral primary osteoarthritis, left knee: Secondary | ICD-10-CM | POA: Diagnosis not present

## 2022-07-15 DIAGNOSIS — M1712 Unilateral primary osteoarthritis, left knee: Secondary | ICD-10-CM | POA: Diagnosis not present

## 2022-07-15 DIAGNOSIS — M170 Bilateral primary osteoarthritis of knee: Secondary | ICD-10-CM | POA: Diagnosis not present

## 2022-07-15 DIAGNOSIS — M1711 Unilateral primary osteoarthritis, right knee: Secondary | ICD-10-CM | POA: Diagnosis not present

## 2022-07-16 ENCOUNTER — Other Ambulatory Visit: Payer: Self-pay | Admitting: Neurology

## 2022-07-21 DIAGNOSIS — K649 Unspecified hemorrhoids: Secondary | ICD-10-CM | POA: Diagnosis not present

## 2022-07-21 DIAGNOSIS — Z09 Encounter for follow-up examination after completed treatment for conditions other than malignant neoplasm: Secondary | ICD-10-CM | POA: Diagnosis not present

## 2022-07-21 DIAGNOSIS — K5730 Diverticulosis of large intestine without perforation or abscess without bleeding: Secondary | ICD-10-CM | POA: Diagnosis not present

## 2022-07-21 DIAGNOSIS — Z86010 Personal history of colonic polyps: Secondary | ICD-10-CM | POA: Diagnosis not present

## 2022-07-22 DIAGNOSIS — M1712 Unilateral primary osteoarthritis, left knee: Secondary | ICD-10-CM | POA: Diagnosis not present

## 2022-07-22 DIAGNOSIS — M1711 Unilateral primary osteoarthritis, right knee: Secondary | ICD-10-CM | POA: Diagnosis not present

## 2022-07-22 DIAGNOSIS — M170 Bilateral primary osteoarthritis of knee: Secondary | ICD-10-CM | POA: Diagnosis not present

## 2022-08-05 NOTE — Telephone Encounter (Signed)
We can do nerve blocks

## 2022-08-06 ENCOUNTER — Ambulatory Visit: Payer: Medicare PPO | Admitting: Adult Health

## 2022-08-06 DIAGNOSIS — R519 Headache, unspecified: Secondary | ICD-10-CM | POA: Diagnosis not present

## 2022-08-06 DIAGNOSIS — G8929 Other chronic pain: Secondary | ICD-10-CM | POA: Diagnosis not present

## 2022-08-06 MED ORDER — BUPIVACAINE HCL 0.5 % IJ SOLN
6.0000 mL | Freq: Once | INTRAMUSCULAR | Status: AC
  Administered 2022-08-06: 3 mL

## 2022-08-06 NOTE — Progress Notes (Signed)
   Chief Complaint  Patient presents with   Injections    Pt in 8     History:   04/17/21: [name] is a 81 year old female with a history of migraine headaches.  She states that she has approximately 5 headaches a week.  She states that the last 4 days she is having good days.  She sometimes wakes up with a headache.  Husband states that she always wakes up not feeling good.  Although since she has started CPAP she has not been as sleepy.  She is currently on Ajovy.  She has tried multiple other medications in the past including Botox.  She is not interested in IV medication for migraines.  She returns today for an evaluation.  Today: reports that she has had a headache for a week. Today her headache has improved. Would like trigger point injections. In the past [facility] and headache would stay away for 3 months. Headache is only in the temporal regions bilaterally.   Trigger point injection of bilateral temporalis muscles for headache  Bupivacaine 0.5% was injected on the scalp bilaterally at several locations:   -2 injections of 0.5 cc were done in the temporal regions, 2 fingerbreadths above the tragus of the ear, with the second injection one fingerbreadth posteriorly to the first.  -1 injection each side of 0.5 cc was done anterior to the tragus of the ear for a trigeminal ganglion injection    The patient tolerated the injections well, no complications of the procedure were noted. Injections were made with a 27-gauge needle.    Ward Givens, MSN, NP-C 08/06/2022, 8:38 AM [facility] [address], [place], [place] [address] [phone]

## 2022-08-06 NOTE — Progress Notes (Signed)
Nerve block ( w/o) steroid: Pt signed consent YES   0.5% Bupivocaine 6 mL LOT: [id number] EXP: 11/2024 NDC: 67544-920-10

## 2022-08-09 NOTE — Progress Notes (Unsigned)
Cardiology Office Note:    Date:  08/09/2022   ID:  [name], DOB 1941-02-13, MRN [id number]  PCP:  Lawerance Cruel, [place] Providers Cardiologist:  None { Click to update primary MD,subspecialty MD or APP then REFRESH:1}    Referring MD: Lawerance Cruel, MD   No chief complaint on file. ***  History of Present Illness:    [name] is a 81 y.o. female with a hx of PACs ( Previously seen by [facility] )     Past Medical History:  Diagnosis Date   Hashimoto's thyroiditis    Hypothyroidism    Migraines    Thyroid cancer ([place])     Past Surgical History:  Procedure Laterality Date   APPENDECTOMY     BREAST CYST ASPIRATION Right 2014   BREAST EXCISIONAL BIOPSY Left 2008   benign   cataract surgery Bilateral 2020   CESAREAN SECTION     CHOLECYSTECTOMY     FOOT SURGERY     THYROIDECTOMY     uterine fibroid removal      Current Medications: No outpatient medications have been marked as taking for the 08/11/22 encounter (Appointment) with Kitana Gage, Wonda Cheng, MD.   Current Facility-Administered Medications for the 08/11/22 encounter (Appointment) with Leaner Morici, Wonda Cheng, MD  Medication   Eduard Roux SOAJ 140 mg     Allergies:   Atorvastatin, Paxil [paroxetine hcl], Rosuvastatin calcium, Statins, Sulfamethoxazole, and Zonisamide   Social History   Socioeconomic History   Marital status: Married    Spouse name: Not on file   Number of children: Not on file   Years of education: Not on file   Highest education level: Not on file  Occupational History   Not on file  Tobacco Use   Smoking status: Never   Smokeless tobacco: Never   Tobacco comments:    second hand exposure at work  Scientific laboratory technician Use: Never used  Substance and Sexual Activity   Alcohol use: Never   Drug use: Never   Sexual activity: Not on file  Other Topics Concern   Not on file  Social History Narrative   Lives at home with husband    Right handed   Caffeine: 1 cup some days   Social Determinants of Health   Financial Resource Strain: Not on file  Food Insecurity: Not on file  Transportation Needs: Not on file  Physical Activity: Not on file  Stress: Not on file  Social Connections: Not on file     Family History: The patient's ***family history includes Headache in her cousin, maternal aunt, mother, niece, paternal grandmother, sister, and another family member; Rheum arthritis in her sister. There is no history of Stroke or Breast cancer.  ROS:   Please see the history of present illness.    *** All other systems reviewed and are negative.  EKGs/Labs/Other Studies Reviewed:    The following studies were reviewed today: ***  EKG:  EKG is *** ordered today.  The ekg ordered today demonstrates ***  Recent Labs: No results found for requested labs within last 365 days.  Recent Lipid Panel No results found for: "CHOL", "TRIG", "HDL", "CHOLHDL", "VLDL", "LDLCALC", "LDLDIRECT"   Risk Assessment/Calculations:   {Does this patient have ATRIAL FIBRILLATION?:[phone]}  No BP recorded.  {Refresh Note OR Click here to enter BP  :1}***         Physical Exam:    VS:  There were no  vitals taken for this visit.    Wt Readings from Last 3 Encounters:  06/23/22 179 lb (81.2 kg)  05/13/22 180 lb 8 oz (81.9 kg)  09/03/21 181 lb (82.1 kg)     GEN: *** Well nourished, well developed in no acute distress HEENT: Normal NECK: No JVD; No carotid bruits LYMPHATICS: No lymphadenopathy CARDIAC: ***RRR, no murmurs, rubs, gallops RESPIRATORY:  Clear to auscultation without rales, wheezing or rhonchi  ABDOMEN: Soft, non-tender, non-distended MUSCULOSKELETAL:  No edema; No deformity  SKIN: Warm and dry NEUROLOGIC:  Alert and oriented x 3 PSYCHIATRIC:  Normal affect   ASSESSMENT:    No diagnosis found. PLAN:    In order of problems listed above:  ***      {Are you ordering a CV Procedure (e.g. stress  test, cath, DCCV, TEE, etc)?   Press F2        :[id number]}    Medication Adjustments/Labs and Tests Ordered: Current medicines are reviewed at length with the patient today.  Concerns regarding medicines are outlined above.  No orders of the defined types were placed in this encounter.  No orders of the defined types were placed in this encounter.   There are no Patient Instructions on file for this visit.   Signed, Mertie Moores, MD  08/09/2022 8:48 PM    [place]

## 2022-08-11 ENCOUNTER — Encounter: Payer: Self-pay | Admitting: Cardiovascular Disease

## 2022-08-11 ENCOUNTER — Ambulatory Visit: Payer: Medicare PPO | Attending: Cardiovascular Disease | Admitting: Cardiovascular Disease

## 2022-08-11 VITALS — BP 150/110 | HR 103 | Ht 62.0 in | Wt 180.0 lb

## 2022-08-11 DIAGNOSIS — R002 Palpitations: Secondary | ICD-10-CM

## 2022-08-11 DIAGNOSIS — I10 Essential (primary) hypertension: Secondary | ICD-10-CM

## 2022-08-11 MED ORDER — NEBIVOLOL HCL 2.5 MG PO TABS: 2.5000 mg | ORAL_TABLET | Freq: Every day | ORAL | 3 refills | Status: DC

## 2022-08-11 NOTE — Patient Instructions (Signed)
Medication Instructions:  START BYSTOLIC 2.5 MG EVERY DAY *If you need a refill on your cardiac medications before your next appointment, please call your pharmacy*   Lab Work: NONE If you have labs (blood work) drawn today and your tests are completely normal, you will receive your results only by: [place] (if you have MyChart) OR A paper copy in the mail If you have any lab test that is abnormal or we need to change your treatment, we will call you to review the results.   Testing/Procedures: Your physician has requested that you have an echocardiogram. Echocardiography is a painless test that uses sound waves to create images of your heart. It provides your doctor with information about the size and shape of your heart and how well your heart's chambers and valves are working. This procedure takes approximately one hour. There are no restrictions for this procedure.    Follow-Up: At [facility], you and your health needs are our priority.  As part of our continuing mission to provide you with exceptional heart care, we have created designated Provider Care Teams.  These Care Teams include your primary Cardiologist (physician) and Advanced Practice Providers (APPs -  Physician Assistants and Nurse Practitioners) who all work together to provide you with the care you need, when you need it.  We recommend signing up for the patient portal called "MyChart".  Sign up information is provided on this After Visit Summary.  MyChart is used to connect with patients for Virtual Visits (Telemedicine).  Patients are able to view lab/test results, encounter notes, upcoming appointments, etc.  Non-urgent messages can be sent to your provider as well.   To learn more about what you can do with MyChart, go to [email or web address].    Your next appointment:   1 month(s)  The format for your next appointment:   In Person  Provider:  SCOTT WEAVER PAC ,Christen Bame NP, OR  DR  [place]

## 2022-08-19 DIAGNOSIS — M1712 Unilateral primary osteoarthritis, left knee: Secondary | ICD-10-CM | POA: Diagnosis not present

## 2022-08-19 DIAGNOSIS — M170 Bilateral primary osteoarthritis of knee: Secondary | ICD-10-CM | POA: Diagnosis not present

## 2022-08-24 IMAGING — MG MM DIGITAL SCREENING BILAT W/ TOMO AND CAD
8 series · 8 of 24 positions shown · non-contrast
Comparison: Previous exam(s).

CLINICAL DATA: Screening.

EXAM:
DIGITAL SCREENING BILATERAL MAMMOGRAM WITH TOMOSYNTHESIS AND CAD
TECHNIQUE: Bilateral screening digital craniocaudal and mediolateral oblique
mammograms were obtained. Bilateral screening digital breast
tomosynthesis was performed. The images were evaluated with
computer-aided detection.

[R MLO synth-2D]
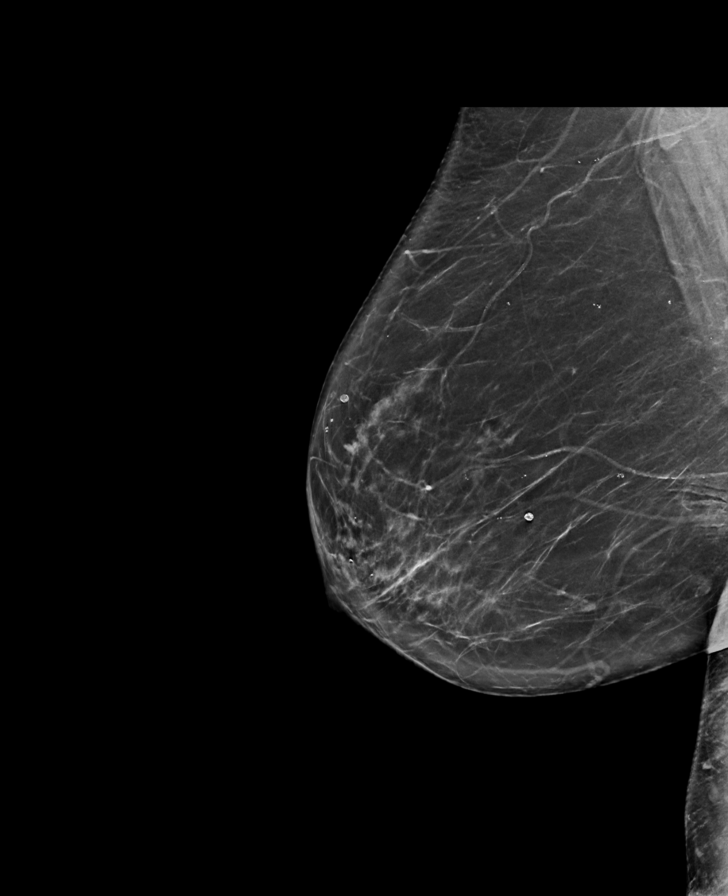

[L CC synth-2D]
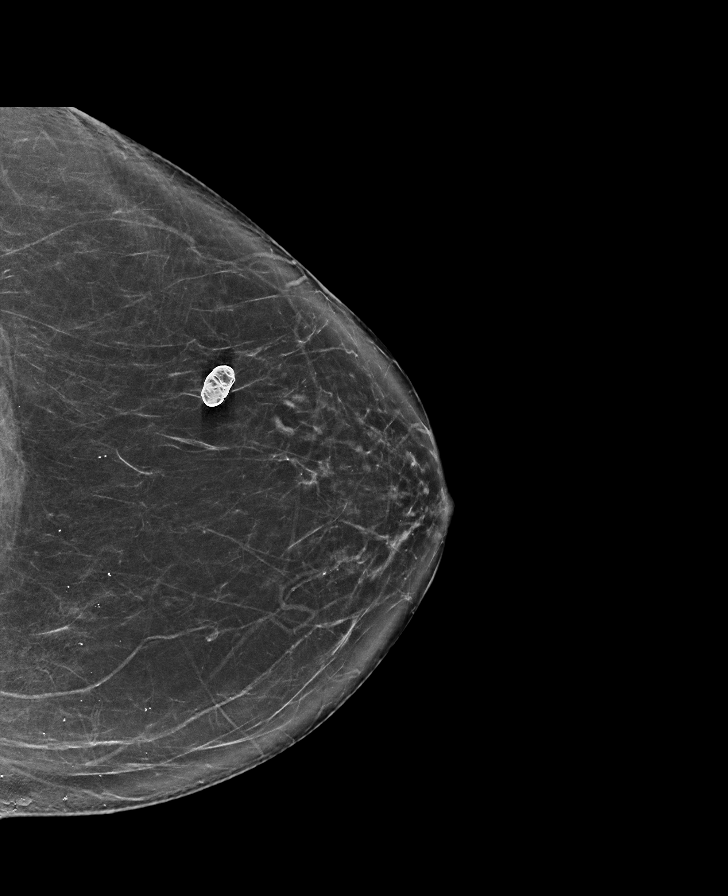

[L MLO synth-2D]
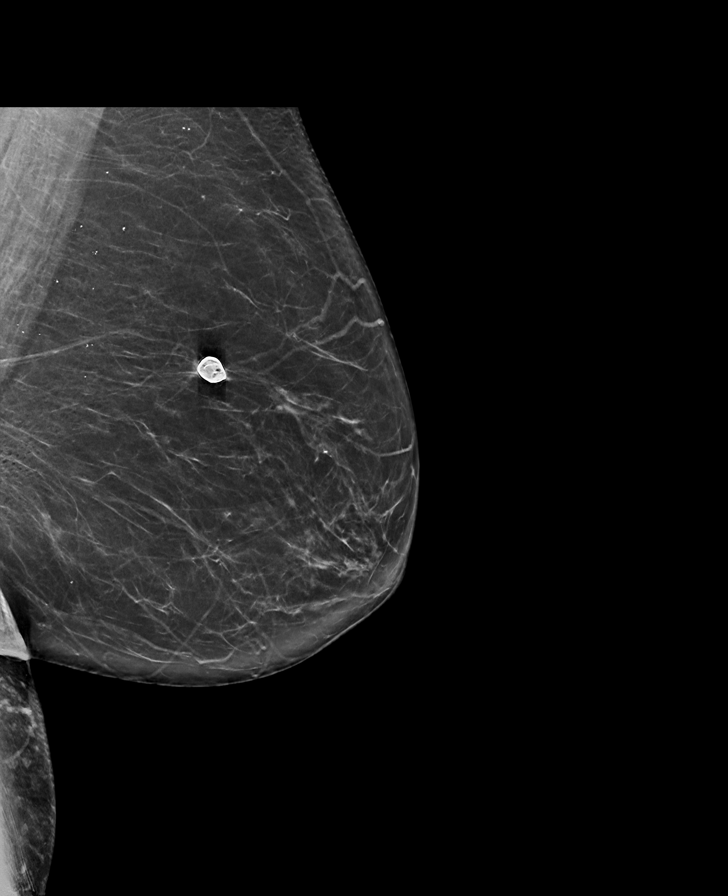

[R CC synth-2D]
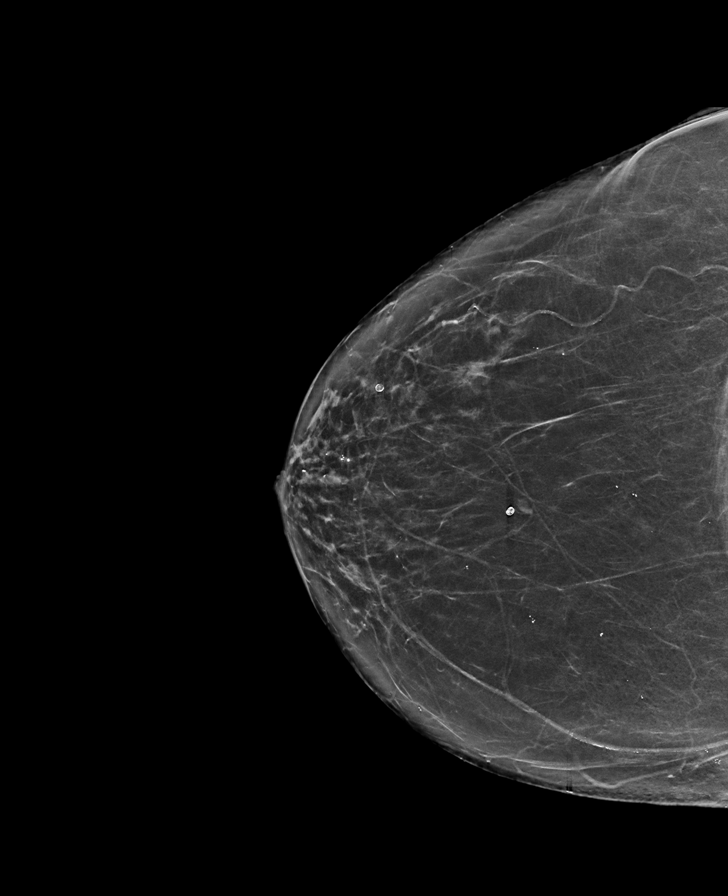

[L CC tomo · tomo slice 38/75.0]
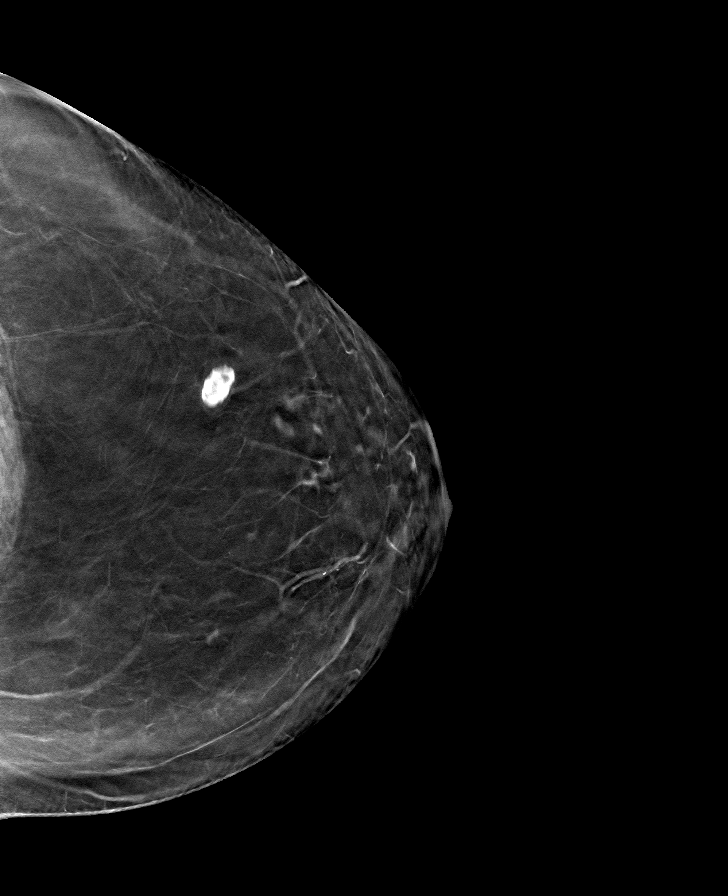

[R MLO tomo · tomo slice 41/80.0]
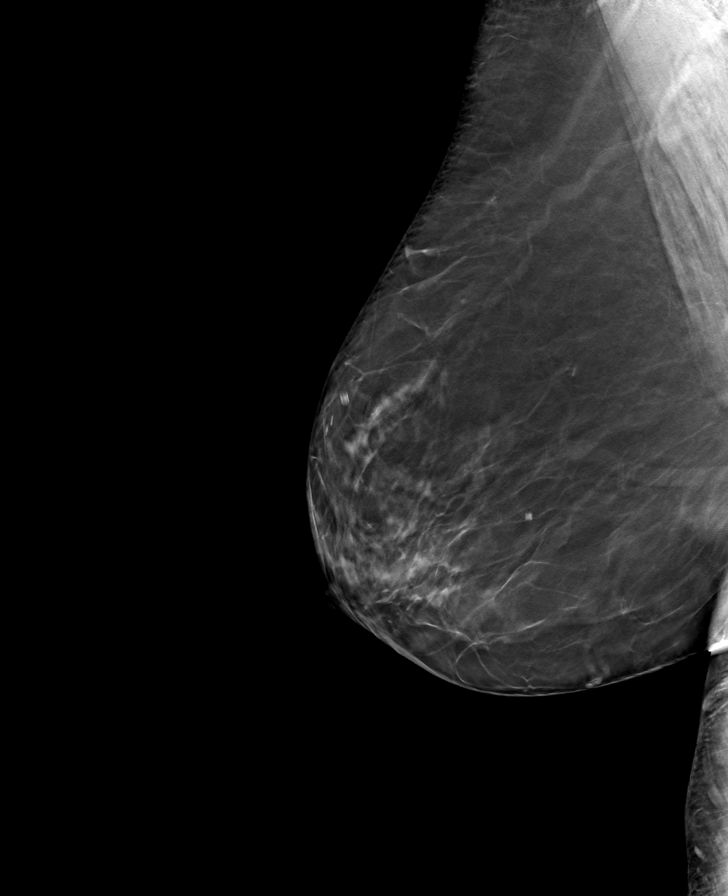

[R CC tomo · tomo slice 36/71.0]
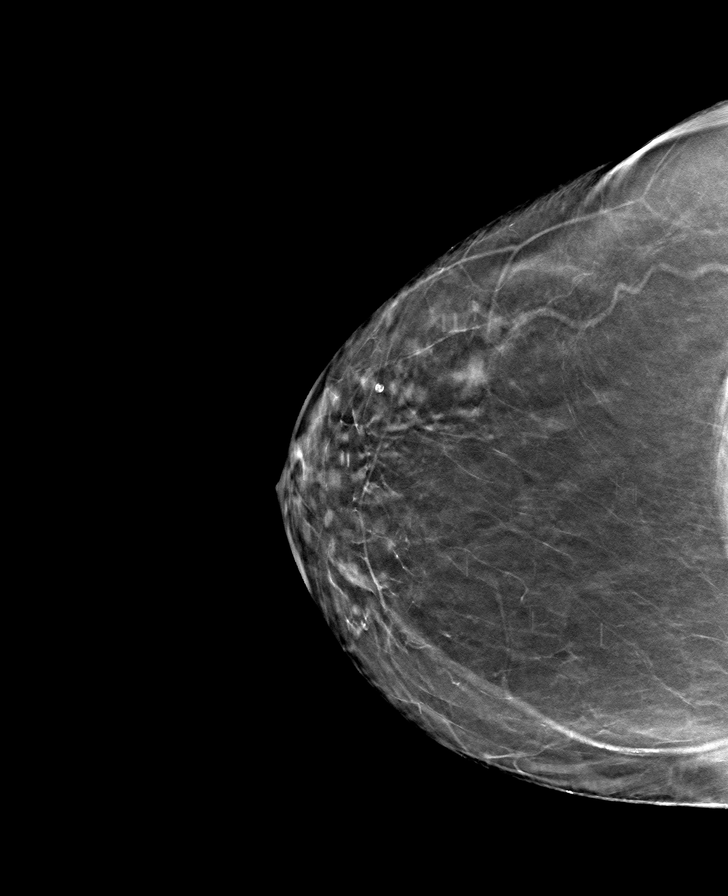

[L MLO tomo · tomo slice 39/77.0]
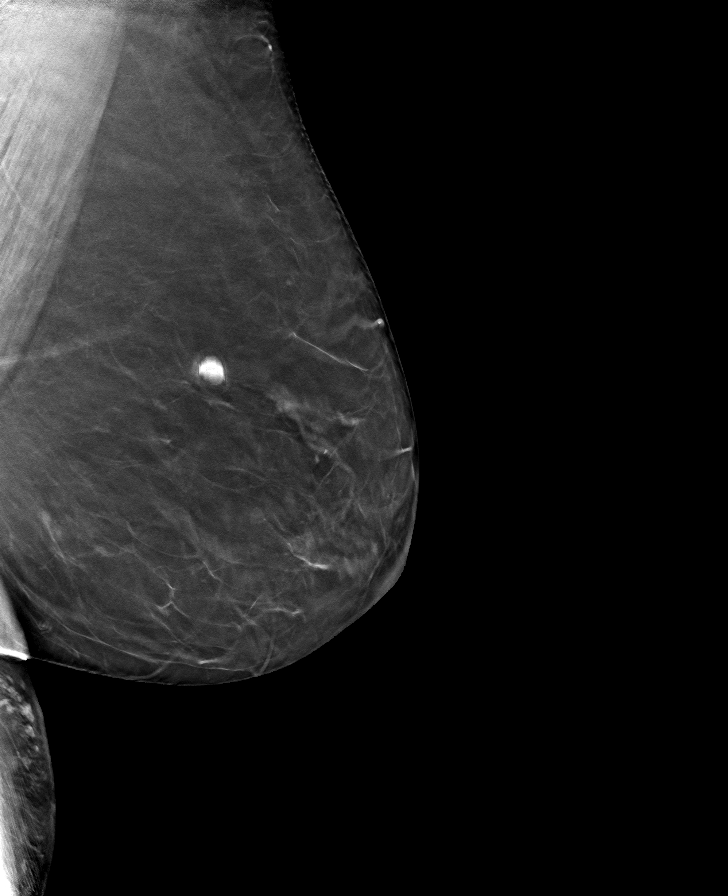

[8 of 24 positions shown; findings below may reference images not displayed]

ACR Breast Density Category b: There are scattered areas of
fibroglandular density.
FINDINGS: There are no findings suspicious for malignancy.
IMPRESSION: No mammographic evidence of malignancy. A result letter of this
screening mammogram will be mailed directly to the patient.

RECOMMENDATION:
Screening mammogram in one year. (Code:51-O-LD2)

BI-RADS CATEGORY  1: Negative.

## 2022-08-31 ENCOUNTER — Ambulatory Visit (HOSPITAL_COMMUNITY): Payer: Medicare PPO | Attending: Cardiovascular Disease

## 2022-08-31 DIAGNOSIS — I371 Nonrheumatic pulmonary valve insufficiency: Secondary | ICD-10-CM | POA: Diagnosis not present

## 2022-08-31 DIAGNOSIS — R002 Palpitations: Secondary | ICD-10-CM | POA: Diagnosis not present

## 2022-08-31 LAB — ECHOCARDIOGRAM COMPLETE
Area-P 1/2: 3.31 cm2
Calc EF: 54 %
S' Lateral: 3.7 cm
Single Plane A2C EF: 55.5 %
Single Plane A4C EF: 51.5 %

## 2022-09-08 DIAGNOSIS — Z6829 Body mass index (BMI) 29.0-29.9, adult: Secondary | ICD-10-CM | POA: Diagnosis not present

## 2022-09-08 DIAGNOSIS — Z124 Encounter for screening for malignant neoplasm of cervix: Secondary | ICD-10-CM | POA: Diagnosis not present

## 2022-09-08 DIAGNOSIS — Z01419 Encounter for gynecological examination (general) (routine) without abnormal findings: Secondary | ICD-10-CM | POA: Diagnosis not present

## 2022-09-15 ENCOUNTER — Ambulatory Visit: Payer: Medicare PPO | Attending: Cardiovascular Disease | Admitting: Cardiovascular Disease

## 2022-09-15 ENCOUNTER — Encounter: Payer: Self-pay | Admitting: Cardiovascular Disease

## 2022-09-15 VITALS — BP 130/100 | HR 58 | Ht 62.0 in | Wt 177.0 lb

## 2022-09-15 DIAGNOSIS — I10 Essential (primary) hypertension: Secondary | ICD-10-CM

## 2022-09-15 NOTE — Patient Instructions (Signed)
Medication Instructions:  Your physician recommends that you continue on your current medications as directed. Please refer to the Current Medication list given to you today.  *If you need a refill on your cardiac medications before your next appointment, please call your pharmacy*   Lab Work: NONE If you have labs (blood work) drawn today and your tests are completely normal, you will receive your results only by: MyChart Message (if you have MyChart) OR A paper copy in the mail If you have any lab test that is abnormal or we need to change your treatment, we will call you to review the results.   Testing/Procedures: NONE   Follow-Up: At [facility], you and your health needs are our priority.  As part of our continuing mission to provide you with exceptional heart care, we have created designated Provider Care Teams.  These Care Teams include your primary Cardiologist (physician) and Advanced Practice Providers (APPs -  Physician Assistants and Nurse Practitioners) who all work together to provide you with the care you need, when you need it.  We recommend signing up for the patient portal called "MyChart".  Sign up information is provided on this After Visit Summary.  MyChart is used to connect with patients for Virtual Visits (Telemedicine).  Patients are able to view lab/test results, encounter notes, upcoming appointments, etc.  Non-urgent messages can be sent to your provider as well.   To learn more about what you can do with MyChart, go to [email or web address].    Your next appointment:   6 month(s)  The format for your next appointment:   In Person  Provider:   Philip Nahser, MD       Important Information About Sugar       

## 2022-09-15 NOTE — Progress Notes (Signed)
Cardiology Office Note:    Date:  09/15/2022   ID:  [name], DOB May 27, 1941, MRN [id number]  PCP:  Lawerance Cruel, [place] Providers Cardiologist:  Tallon Gertz   Referring MD: Lawerance Cruel, MD   Chief Complaint  Patient presents with   Hypertension         History of Present Illness:    [name]  ([name]) is a 81 y.o. female with a hx of PACs ( Previously seen by [facility] )   Seen with husband, [name]   Has had an irreg HR.  Has irreg HR .  Has tried metoprolol but she did not tolerate it  She does not remember the dose .   Also has been found to have low iron stores Hb actually eventually dropped   Tries to avoid salt  Does not take BP meds daily.   Takes amlodipine 2.5 mg PRN BP > 130  Takes Lisinopril 2.5 mg PRN SBP  > 170  BP was 117/ 78  HR is frequently elevated.  Has knee arthritis.  She is getting cartilage injected into her knee and hopefully will improve soon.  Active around the house,  cleans when she feels    Has chronic migraine headaches ,  has nerve blocks regularly   Has hypothyroidism, is on Levothyroxine.  Had a sleep study by Dr. Barbette Hair  Labs from Dr. Edwena Felty office from May 13, 2022 reveals an iron binding capacity normal at 313, iron levels 130, iron saturation is 42, ferritin level is 41.  All of these are within normal limits.   September 15, 2022: This is may be seen today for follow-up of her hypertension. Is having some issues with migraine headaches. Her  strength is better .   She brought her blood pressure log with her.  Her blood pressures at home range anywhere from the 1 40-50 range.  Occasionally she has blood pressures in the 90/50 range.  Overall she is feeling well and her blood pressure is in the normal or perhaps mildly elevated range.  Takes ergotamine / caffeine for her chronic migraine headaches This is an alpha agonist  - certainly is contributing to her HTN  '  Wt is 177 lbs    Past Medical History:  Diagnosis Date   Hashimoto's thyroiditis    Hypothyroidism    Migraines    Thyroid cancer [facility])     Past Surgical History:  Procedure Laterality Date   APPENDECTOMY     BREAST CYST ASPIRATION Right 2014   BREAST EXCISIONAL BIOPSY Left 2008   benign   cataract surgery Bilateral 2020   CESAREAN SECTION     CHOLECYSTECTOMY     FOOT SURGERY     THYROIDECTOMY     uterine fibroid removal      Current Medications: Current Meds  Medication Sig   Cholecalciferol (VITAMIN D3) 50 MCG (2000 UT) capsule Take 2 capsules by mouth daily.   ergotamine-caffeine (CAFERGOT) 1-100 MG tablet ergotamine 1 mg-caffeine 100 mg tablet  1/2 TABLET FOR HEADACHE / WOULD NOT USE MORE THAN 1/2 TABLET IN 24 HOURS   ibuprofen (ADVIL) 200 MG tablet Take by mouth.   nebivolol (BYSTOLIC) 2.5 MG tablet Take 1 tablet (2.5 mg total) by mouth daily.   Omega-3 Fatty Acids (OMEGA-3 FISH OIL PO) Take by mouth.   TIROSINT 125 MCG CAPS Take 125 mcg by mouth daily.    Current Facility-Administered Medications for the 09/15/22  encounter (Office Visit) with Cheyla Duchemin, Wonda Cheng, MD  Medication   Eduard Roux SOAJ 140 mg     Allergies:   Atorvastatin, Paxil [paroxetine hcl], Rosuvastatin calcium, Statins, Sulfamethoxazole, and Zonisamide   Social History   Socioeconomic History   Marital status: Married    Spouse name: Not on file   Number of children: Not on file   Years of education: Not on file   Highest education level: Not on file  Occupational History   Not on file  Tobacco Use   Smoking status: Never   Smokeless tobacco: Never   Tobacco comments:    second hand exposure at work  Scientific laboratory technician Use: Never used  Substance and Sexual Activity   Alcohol use: Never   Drug use: Never   Sexual activity: Not on file  Other Topics Concern   Not on file  Social History Narrative   Lives at home with husband   Right handed   Caffeine: 1 cup some days    Social Determinants of Health   Financial Resource Strain: Not on file  Food Insecurity: Not on file  Transportation Needs: Not on file  Physical Activity: Not on file  Stress: Not on file  Social Connections: Not on file     Family History: The patient's family history includes Headache in her cousin, maternal aunt, mother, niece, paternal grandmother, sister, and another family member; Rheum arthritis in her sister. There is no history of Stroke or Breast cancer.  ROS:   Please see the history of present illness.     All other systems reviewed and are negative.  EKGs/Labs/Other Studies Reviewed:      EKG:    .Marland Kitchen  Recent Labs: No results found for requested labs within last 365 days.  Recent Lipid Panel No results found for: "CHOL", "TRIG", "HDL", "CHOLHDL", "VLDL", "LDLCALC", "LDLDIRECT"   Risk Assessment/Calculations:      HYPERTENSION CONTROL Vitals:   09/15/22 1406 09/15/22 1441  BP: (!) 150/90 (!) 130/100    The patient's blood pressure is elevated above target today.  In order to address the patient's elevated BP: The blood pressure is usually elevated in clinic.  Blood pressures monitored at home have been optimal.            Physical Exam:      ASSESSMENT:    1. Benign essential hypertension     PLAN:      Hypertension:    Her home blood pressure log overall looks pretty good.  She has some readings that are elevated.  She has lots of readings in the normal range and some readings in the low range.  Given this information I do not think that we can reliably start her on any additional new medications without causing episodes of hypotension and put her at risk of falling.  She still eats occasional salty food including bacon and sausage.  I encouraged her to eliminate all processed meats.  We will give her some instructions on following a Dash diet.  I encouraged her to continue with her weight loss efforts.  She will continue the current  medications.  She will continue to keep a blood pressure log.  We will see her again in 6 months for follow-up visit.      Medication Adjustments/Labs and Tests Ordered: Current medicines are reviewed at length with the patient today.  Concerns regarding medicines are outlined above.  No orders of the defined types were placed in this encounter.  No orders of the defined types were placed in this encounter.   Patient Instructions  Medication Instructions:  Your physician recommends that you continue on your current medications as directed. Please refer to the Current Medication list given to you today.  *If you need a refill on your cardiac medications before your next appointment, please call your pharmacy*   Lab Work: NONE If you have labs (blood work) drawn today and your tests are completely normal, you will receive your results only by: [place] (if you have MyChart) OR A paper copy in the mail If you have any lab test that is abnormal or we need to change your treatment, we will call you to review the results.   Testing/Procedures: NONE   Follow-Up: At [facility], you and your health needs are our priority.  As part of our continuing mission to provide you with exceptional heart care, we have created designated Provider Care Teams.  These Care Teams include your primary Cardiologist (physician) and Advanced Practice Providers (APPs -  Physician Assistants and Nurse Practitioners) who all work together to provide you with the care you need, when you need it.  We recommend signing up for the patient portal called "MyChart".  Sign up information is provided on this After Visit Summary.  MyChart is used to connect with patients for Virtual Visits (Telemedicine).  Patients are able to view lab/test results, encounter notes, upcoming appointments, etc.  Non-urgent messages can be sent to your provider as well.   To learn more about what you can do with MyChart,  go to [email or web address].    Your next appointment:   6 month(s)  The format for your next appointment:   In Person  Provider:   Mertie Moores, MD    Important Information About Sugar         Signed, Mertie Moores, MD  09/15/2022 5:33 PM    [place]

## 2022-10-14 DIAGNOSIS — Z0000 Encounter for general adult medical examination without abnormal findings: Secondary | ICD-10-CM | POA: Diagnosis not present

## 2022-10-14 DIAGNOSIS — Z6833 Body mass index (BMI) 33.0-33.9, adult: Secondary | ICD-10-CM | POA: Diagnosis not present

## 2022-10-19 ENCOUNTER — Other Ambulatory Visit: Payer: Self-pay

## 2022-10-19 ENCOUNTER — Emergency Department (HOSPITAL_COMMUNITY)
Admission: EM | Admit: 2022-10-19 | Discharge: 2022-10-20 | Discharge status: Against Medical Advice | Payer: Medicare PPO | Attending: Emergency Medicine | Admitting: Emergency Medicine

## 2022-10-19 ENCOUNTER — Encounter (HOSPITAL_COMMUNITY): Payer: Self-pay

## 2022-10-19 DIAGNOSIS — Z0000 Encounter for general adult medical examination without abnormal findings: Secondary | ICD-10-CM | POA: Diagnosis not present

## 2022-10-19 DIAGNOSIS — R55 Syncope and collapse: Secondary | ICD-10-CM | POA: Insufficient documentation

## 2022-10-19 DIAGNOSIS — Z6832 Body mass index (BMI) 32.0-32.9, adult: Secondary | ICD-10-CM | POA: Diagnosis not present

## 2022-10-19 DIAGNOSIS — E890 Postprocedural hypothyroidism: Secondary | ICD-10-CM | POA: Diagnosis not present

## 2022-10-19 DIAGNOSIS — Z5321 Procedure and treatment not carried out due to patient leaving prior to being seen by health care provider: Secondary | ICD-10-CM | POA: Insufficient documentation

## 2022-10-19 DIAGNOSIS — Z862 Personal history of diseases of the blood and blood-forming organs and certain disorders involving the immune mechanism: Secondary | ICD-10-CM | POA: Diagnosis not present

## 2022-10-19 DIAGNOSIS — E7800 Pure hypercholesterolemia, unspecified: Secondary | ICD-10-CM | POA: Diagnosis not present

## 2022-10-19 DIAGNOSIS — M1990 Unspecified osteoarthritis, unspecified site: Secondary | ICD-10-CM | POA: Diagnosis not present

## 2022-10-19 DIAGNOSIS — G43109 Migraine with aura, not intractable, without status migrainosus: Secondary | ICD-10-CM | POA: Diagnosis not present

## 2022-10-19 DIAGNOSIS — I10 Essential (primary) hypertension: Secondary | ICD-10-CM | POA: Diagnosis not present

## 2022-10-19 LAB — BASIC METABOLIC PANEL
Anion gap: 10 (ref 5–15)
BUN: 22 mg/dL (ref 8–23)
CO2: 26 mmol/L (ref 22–32)
Calcium: 8.8 mg/dL — ABNORMAL LOW (ref 8.9–10.3)
Chloride: 101 mmol/L (ref 98–111)
Creatinine, Ser: 0.79 mg/dL (ref 0.44–1.00)
GFR, Estimated: >60 mL/min (ref >60)
Glucose, Bld: 206 mg/dL — ABNORMAL HIGH (ref 70–99)
Potassium: 3.4 mmol/L — ABNORMAL LOW (ref 3.5–5.1)
Sodium: 137 mmol/L (ref 135–145)

## 2022-10-19 LAB — CBC
HCT: 40.7 % (ref 36.0–46.0)
Hemoglobin: 12.4 g/dL (ref 12.0–15.0)
MCH: 27.5 pg (ref 26.0–34.0)
MCHC: 30.5 g/dL (ref 30.0–36.0)
MCV: 90.2 fL (ref 80.0–100.0)
Platelets: 358 10*3/uL (ref 150–400)
RBC: 4.51 MIL/uL (ref 3.87–5.11)
RDW: 12.9 % (ref 11.5–15.5)
WBC: 13.6 10*3/uL — ABNORMAL HIGH (ref 4.0–10.5)
nRBC: 0 % (ref 0.0–0.2)

## 2022-10-19 LAB — URINALYSIS, ROUTINE W REFLEX MICROSCOPIC
Bilirubin Urine: NEGATIVE
Glucose, UA: NEGATIVE mg/dL
Hgb urine dipstick: NEGATIVE
Ketones, ur: NEGATIVE mg/dL
Leukocytes,Ua: NEGATIVE
Nitrite: NEGATIVE
Protein, ur: NEGATIVE mg/dL
Specific Gravity, Urine: 1.013 (ref 1.005–1.030)
pH: 5 (ref 5.0–8.0)

## 2022-10-19 LAB — CBG MONITORING, ED: Glucose-Capillary: 209 mg/dL — ABNORMAL HIGH (ref 70–99)

## 2022-10-19 NOTE — ED Notes (Signed)
Could not get urine at this time will try again later per pt. Pt has urine cup.

## 2022-10-19 NOTE — ED Triage Notes (Addendum)
Pt from Dr office with EMS, pt was getting an eye exam, while getting her pupils dilated she had a syncopal episode with sweating. Pt c.o feeling weak, denies chest pain, sob or n/v, just felt hot prior to syncope. Pt a.o, nad noted.   CBG 158 18 G LAC

## 2022-10-19 NOTE — ED Notes (Signed)
Pt wanted IV removed stated she had to leave her husband is a diabetic

## 2022-10-19 NOTE — ED Provider Triage Note (Signed)
Emergency Medicine Provider Triage Evaluation Note  [name] , a 81 y.o. female  was evaluated in triage.  Pt complains of syncopal episode.  Patient states that she had a doctor's appointment this morning and had to fast for blood testing.  Afterwards, she had an appointment at her eye doctor.  While she was on one of the machines she became lightheaded and had a syncopal episode.  No associated chest pain or shortness of breath.  She denies history of heart failure.  She feels better now.  Review of Systems  Positive: Syncopal episode Negative: Chest pain, shortness of breath  Physical Exam  BP 121/69   Pulse 80   Temp (!) 97.5 F (36.4 C)   Resp 17   Ht '5\' 2"'$  (1.575 m)   Wt 79.4 kg   SpO2 97%   BMI 32.01 kg/m  Gen:   Awake, no distress   Resp:  Normal effort  MSK:   Moves extremities without difficulty  Other:  Normal rate, no murmur  Medical Decision Making  Medically screening exam initiated at 1:48 PM.  Appropriate orders placed.  [name] was informed that the remainder of the evaluation will be completed by another provider, this initial triage assessment does not replace that evaluation, and the importance of remaining in the ED until their evaluation is complete.     Carlisle Cater, PA-C 10/19/22 1352

## 2022-10-21 DIAGNOSIS — R55 Syncope and collapse: Secondary | ICD-10-CM | POA: Diagnosis not present

## 2022-10-21 DIAGNOSIS — Z6833 Body mass index (BMI) 33.0-33.9, adult: Secondary | ICD-10-CM | POA: Diagnosis not present

## 2022-10-21 DIAGNOSIS — E611 Iron deficiency: Secondary | ICD-10-CM | POA: Diagnosis not present

## 2022-11-03 ENCOUNTER — Other Ambulatory Visit: Payer: Self-pay | Admitting: Obstetrics & Gynecology

## 2022-11-03 DIAGNOSIS — Z1231 Encounter for screening mammogram for malignant neoplasm of breast: Secondary | ICD-10-CM

## 2022-11-09 DIAGNOSIS — H5201 Hypermetropia, right eye: Secondary | ICD-10-CM | POA: Diagnosis not present

## 2022-11-09 DIAGNOSIS — H524 Presbyopia: Secondary | ICD-10-CM | POA: Diagnosis not present

## 2022-11-09 DIAGNOSIS — H52223 Regular astigmatism, bilateral: Secondary | ICD-10-CM | POA: Diagnosis not present

## 2022-11-09 DIAGNOSIS — Z961 Presence of intraocular lens: Secondary | ICD-10-CM | POA: Diagnosis not present

## 2022-12-21 DIAGNOSIS — E890 Postprocedural hypothyroidism: Secondary | ICD-10-CM | POA: Diagnosis not present

## 2022-12-21 DIAGNOSIS — D649 Anemia, unspecified: Secondary | ICD-10-CM | POA: Diagnosis not present

## 2022-12-21 DIAGNOSIS — E611 Iron deficiency: Secondary | ICD-10-CM | POA: Diagnosis not present

## 2022-12-21 DIAGNOSIS — Z6832 Body mass index (BMI) 32.0-32.9, adult: Secondary | ICD-10-CM | POA: Diagnosis not present

## 2022-12-25 ENCOUNTER — Ambulatory Visit
Admission: RE | Admit: 2022-12-25 | Discharge: 2022-12-25 | Disposition: A | Discharge status: Home / Self Care | Payer: Medicare PPO | Source: Ambulatory Visit | Attending: Obstetrics & Gynecology | Admitting: Obstetrics & Gynecology

## 2022-12-25 DIAGNOSIS — Z1231 Encounter for screening mammogram for malignant neoplasm of breast: Secondary | ICD-10-CM | POA: Diagnosis not present

## 2023-01-01 DIAGNOSIS — R0781 Pleurodynia: Secondary | ICD-10-CM | POA: Diagnosis not present

## 2023-02-06 DIAGNOSIS — D539 Nutritional anemia, unspecified: Secondary | ICD-10-CM | POA: Diagnosis not present

## 2023-02-06 DIAGNOSIS — R030 Elevated blood-pressure reading, without diagnosis of hypertension: Secondary | ICD-10-CM | POA: Diagnosis not present

## 2023-02-06 DIAGNOSIS — R5383 Other fatigue: Secondary | ICD-10-CM | POA: Diagnosis not present

## 2023-02-06 DIAGNOSIS — R0602 Shortness of breath: Secondary | ICD-10-CM | POA: Diagnosis not present

## 2023-02-06 DIAGNOSIS — R519 Headache, unspecified: Secondary | ICD-10-CM | POA: Diagnosis not present

## 2023-02-06 DIAGNOSIS — Z79899 Other long term (current) drug therapy: Secondary | ICD-10-CM | POA: Diagnosis not present

## 2023-02-15 DIAGNOSIS — Z8669 Personal history of other diseases of the nervous system and sense organs: Secondary | ICD-10-CM | POA: Diagnosis not present

## 2023-02-15 DIAGNOSIS — R5383 Other fatigue: Secondary | ICD-10-CM | POA: Diagnosis not present

## 2023-02-15 DIAGNOSIS — R0602 Shortness of breath: Secondary | ICD-10-CM | POA: Diagnosis not present

## 2023-02-15 DIAGNOSIS — I10 Essential (primary) hypertension: Secondary | ICD-10-CM | POA: Diagnosis not present

## 2023-02-16 DIAGNOSIS — S51811A Laceration without foreign body of right forearm, initial encounter: Secondary | ICD-10-CM | POA: Diagnosis not present

## 2023-02-16 DIAGNOSIS — R030 Elevated blood-pressure reading, without diagnosis of hypertension: Secondary | ICD-10-CM | POA: Diagnosis not present

## 2023-02-16 DIAGNOSIS — E611 Iron deficiency: Secondary | ICD-10-CM | POA: Diagnosis not present

## 2023-02-16 DIAGNOSIS — M2550 Pain in unspecified joint: Secondary | ICD-10-CM | POA: Diagnosis not present

## 2023-02-16 DIAGNOSIS — R5381 Other malaise: Secondary | ICD-10-CM | POA: Diagnosis not present

## 2023-02-18 ENCOUNTER — Telehealth: Payer: Self-pay | Admitting: Hematology and Oncology

## 2023-02-18 DIAGNOSIS — R0600 Dyspnea, unspecified: Secondary | ICD-10-CM | POA: Diagnosis not present

## 2023-02-18 NOTE — Telephone Encounter (Signed)
scheduled per 4/15 referral , pt has been called and confirmed date and time. Pt is aware of location and to arrive early for check in   

## 2023-02-25 ENCOUNTER — Ambulatory Visit: Payer: Medicare PPO | Admitting: Adult Health

## 2023-02-25 ENCOUNTER — Encounter: Payer: Self-pay | Admitting: Adult Health

## 2023-02-25 VITALS — BP 181/93 | HR 87 | Ht 62.0 in | Wt 178.6 lb

## 2023-02-25 DIAGNOSIS — D509 Iron deficiency anemia, unspecified: Secondary | ICD-10-CM | POA: Diagnosis not present

## 2023-02-25 DIAGNOSIS — G2581 Restless legs syndrome: Secondary | ICD-10-CM

## 2023-02-25 MED ORDER — QULIPTA 30 MG PO TABS: 30.0000 mg | ORAL_TABLET | Freq: Every day | ORAL | 5 refills | Status: DC

## 2023-02-25 NOTE — Patient Instructions (Signed)
Your Plan:  Recommend that you wean of ergotamine-caffeine: decrease to 1/2 daily every other day for one week then every third day for 1 week then stop.   Start Qulipta 30 mg daily  If your symptoms worsen or you develop new symptoms please let us know.       Thank you for coming to see [place] at [facility]. I hope we have been able to provide you high quality care today.  You may receive a patient satisfaction survey over the next few weeks. We would appreciate your feedback and comments so that we may continue to improve ourselves and the health of our patients.

## 2023-02-25 NOTE — Progress Notes (Signed)
PATIENT: [name] DOB: December 09, 1940  REASON FOR VISIT: follow up HISTORY FROM: patient PRIMARY NEUROLOGIST:   Chief Complaint  Patient presents with   Follow-up    Rm 18, Worsening headaches. This last one still on going for a week.  Taking advil 200mg  daily if needed, and 1/2 caferot / tab as needed when has headache , on prednisone taper.     HISTORY OF PRESENT ILLNESS: Today 02/25/23  [name] is a 82 y.o. female who has been followed in this office for migraine headaches. Returns today for follow-up. Husband reports that she complains of a headache daily. Usually in the frontal region. Reports photophobia and phonophobia. Denies nausea and vomiting.  Had Tigger point injections last visit and reports it helped but didn't last.  Reports that she has tried all the CGRP injectables.  She does report that she takes ergotamine with caffeine half a tablet on a daily basis.  She was also taking Advil on a daily basis but is now on Celebrex.  Patient also reports that she has low ferritin and iron levels.  She would like this rechecked today.  She returns today for evaluation.    REVIEW OF SYSTEMS: Out of a complete 14 system review of symptoms, the patient complains only of the following symptoms, and all other reviewed systems are negative.  ALLERGIES: Allergies  Allergen Reactions   Atorvastatin     Other reaction(s): Myalgias (muscle pain) Other reaction(s): Myalgias (intolerance)   Paxil [Paroxetine Hcl] Other (See Comments)    syncope   Rosuvastatin Calcium Other (See Comments)   Statins    Sulfamethoxazole Other (See Comments)   Zonisamide Other (See Comments)    HOME MEDICATIONS: Outpatient Medications Prior to Visit  Medication Sig Dispense Refill   Cholecalciferol (VITAMIN D3) 50 MCG (2000 UT) capsule Take 2 capsules by mouth daily.     ergotamine-caffeine (CAFERGOT) 1-100 MG tablet ergotamine 1 mg-caffeine 100 mg tablet  1/2 TABLET FOR HEADACHE / WOULD NOT  USE MORE THAN 1/2 TABLET IN 24 HOURS     ibuprofen (ADVIL) 200 MG tablet Take by mouth.     nebivolol (BYSTOLIC) 2.5 MG tablet Take 1 tablet (2.5 mg total) by mouth daily. 90 tablet 3   Omega-3 Fatty Acids (OMEGA-3 FISH OIL PO) Take by mouth.     TIROSINT 125 MCG CAPS Take 125 mcg by mouth daily.      Facility-Administered Medications Prior to Visit  Medication Dose Route Frequency Provider Last Rate Last Admin   Erenumab-aooe SOAJ 140 mg  140 mg Subcutaneous Once Anson Fret, MD        PAST MEDICAL HISTORY: Past Medical History:  Diagnosis Date   Hashimoto's thyroiditis    Hypothyroidism    Migraines    Thyroid cancer (HCC)     PAST SURGICAL HISTORY: Past Surgical History:  Procedure Laterality Date   APPENDECTOMY     BREAST CYST ASPIRATION Right 2014   BREAST EXCISIONAL BIOPSY Left 2008   benign   cataract surgery Bilateral 2020   CESAREAN SECTION     CHOLECYSTECTOMY     FOOT SURGERY     THYROIDECTOMY     uterine fibroid removal      FAMILY HISTORY: Family History  Problem Relation Age of Onset   Headache Mother        occasional   Headache Sister    Rheum arthritis Sister    Headache Maternal Aunt  took many medications    Headache Paternal Grandmother    Headache Cousin    Headache Niece    Headache Other    Stroke Neg Hx    Breast cancer Neg Hx     SOCIAL HISTORY: Social History   Socioeconomic History   Marital status: Married    Spouse name: Not on file   Number of children: Not on file   Years of education: Not on file   Highest education level: Not on file  Occupational History   Not on file  Tobacco Use   Smoking status: Never   Smokeless tobacco: Never   Tobacco comments:    second hand exposure at work  Vaping Use   Vaping Use: Never used  Substance and Sexual Activity   Alcohol use: Never   Drug use: Never   Sexual activity: Not on file  Other Topics Concern   Not on file  Social History Narrative   Lives at home  with husband   Right handed   Caffeine: 1 cup some days   Social Determinants of Health   Financial Resource Strain: Not on file  Food Insecurity: Not on file  Transportation Needs: Not on file  Physical Activity: Not on file  Stress: Not on file  Social Connections: Not on file  Intimate Partner Violence: Not on file      PHYSICAL EXAM  Vitals:   02/25/23 1331  BP: (!) 152/107  Pulse: 91  Weight: 178 lb 9.6 oz (81 kg)  Height:  (1.575 m)   Body mass index is 32.67 kg/m.  Generalized: Well developed, in no acute distress   Neurological examination  Mentation: Alert oriented to time, place, history taking. Follows all commands speech and language fluent Cranial nerve II-XII: Pupils were equal round reactive to light. Extraocular movements were full, visual field were full on confrontational test. Facial sensation and strength were normal.  Head turning and shoulder shrug  were normal and symmetric. Motor: The motor testing reveals 5 over 5 strength of all 4 extremities. Good symmetric motor tone is noted throughout.  Sensory: Sensory testing is intact to soft touch on all 4 extremities. No evidence of extinction is noted.  Coordination: Cerebellar testing reveals good finger-nose-finger and heel-to-shin bilaterally.  Gait and station: Gait is normal.    DIAGNOSTIC DATA (LABS, IMAGING, TESTING) - I reviewed patient records, labs, notes, testing and imaging myself where available.  Lab Results  Component Value Date   WBC 13.6 (H) 10/19/2022   HGB 12.4 10/19/2022   HCT 40.7 10/19/2022   MCV 90.2 10/19/2022   PLT 358 10/19/2022      Component Value Date/Time   NA 137 10/19/2022 1412   K 3.4 (L) 10/19/2022 1412   CL 101 10/19/2022 1412   CO2 26 10/19/2022 1412   GLUCOSE 206 (H) 10/19/2022 1412   BUN 22 10/19/2022 1412   CREATININE 0.79 10/19/2022 1412   CALCIUM 8.8 (L) 10/19/2022 1412   GFRNONAA >60 10/19/2022 1412   GFRAA  06/08/2007 1430    >60         The eGFR has been calculated using the MDRD equation. This calculation has not been validated in all clinical       ASSESSMENT AND PLAN 82 y.o. year old female  has a past medical history of Hashimoto's thyroiditis, Hypothyroidism, Migraines, and Thyroid cancer (HCC). here with:  1.  Migraine headaches 2.  Iron deficiency anemia  Advised patient that she could be having  rebound headaches from taking ergotamine caffeine daily.  Instructed the patient that I would recommend that she wean off of this medication.  Advised that she could take it every other day for 1 week, then every third day for 1 week then stop the medication. Start Qulipta 30 mg daily for migraine headaches Advised if headache frequency or severity does not improve she should let us know I will check iron panel today due to history of iron deficiency anemia Follow-up in 6 months or sooner if needed     Butch Penny, MSN, NP-C 02/25/2023, 1:27 PM [facility] [address] [place], [place] [address] [phone]

## 2023-02-26 LAB — IRON,TIBC AND FERRITIN PANEL
Ferritin: 15 ng/mL (ref 15–150)
Iron Saturation: 15 % (ref 15–55)
Iron: 53 ug/dL (ref 27–139)
Total Iron Binding Capacity: 345 ug/dL (ref 250–450)
UIBC: 292 ug/dL (ref 118–369)

## 2023-03-01 ENCOUNTER — Telehealth: Payer: Self-pay | Admitting: *Deleted

## 2023-03-01 ENCOUNTER — Encounter: Payer: Self-pay | Admitting: Adult Health

## 2023-03-01 NOTE — Telephone Encounter (Signed)
Completed Qulipta PA on CMM. Key: [id number]. Awaiting determination from [facility].

## 2023-03-15 DIAGNOSIS — M25561 Pain in right knee: Secondary | ICD-10-CM | POA: Diagnosis not present

## 2023-03-15 DIAGNOSIS — M25562 Pain in left knee: Secondary | ICD-10-CM | POA: Diagnosis not present

## 2023-03-16 ENCOUNTER — Inpatient Hospital Stay: Payer: Medicare PPO

## 2023-03-16 ENCOUNTER — Inpatient Hospital Stay: Payer: Medicare PPO | Attending: Hematology and Oncology | Admitting: Hematology and Oncology

## 2023-03-16 VITALS — BP 191/87 | HR 84 | Temp 97.8°F | Resp 18 | Ht 62.0 in | Wt 179.3 lb

## 2023-03-16 DIAGNOSIS — Z862 Personal history of diseases of the blood and blood-forming organs and certain disorders involving the immune mechanism: Secondary | ICD-10-CM

## 2023-03-16 DIAGNOSIS — R5383 Other fatigue: Secondary | ICD-10-CM | POA: Diagnosis not present

## 2023-03-16 DIAGNOSIS — Z85850 Personal history of malignant neoplasm of thyroid: Secondary | ICD-10-CM

## 2023-03-16 DIAGNOSIS — D509 Iron deficiency anemia, unspecified: Secondary | ICD-10-CM | POA: Diagnosis not present

## 2023-03-16 NOTE — Progress Notes (Signed)
[facility] CONSULT NOTE  Patient Care Team: Daisy Floro, MD as PCP - General (Family Medicine) Nahser, Deloris Ping, MD as PCP - Cardiology (Cardiology)  CHIEF COMPLAINTS/PURPOSE OF CONSULTATION:  Iron deficiency  HISTORY OF PRESENTING ILLNESS:  [name] 82 y.o. female is here because of recent diagnosis of iron deficiency.  Patient has been experiencing profound fatigue for the past month.  It is to the point that she had to go to emergency room.  She had a checkup of different labs and was found to be iron deficient.  She has been taking oral iron therapy and in spite of that she has iron deficiency and therefore she was referred to [place] for evaluation and treatment.  Her hemoglobin has been relatively normal but she has had a ferritin of 6.  She tells me that all her life she has had issues with iron initially because of heavy menstrual cycles but later from she still needed oral iron replacement therapy because of chronic iron deficiency.  I reviewed her records extensively and collaborated the history with the patient.  SUMMARY OF ONCOLOGIC HISTORY: Oncology History   No history exists.     MEDICAL HISTORY:  Past Medical History:  Diagnosis Date   Hashimoto's thyroiditis    Hypothyroidism    Migraines    Thyroid cancer (HCC)     SURGICAL HISTORY: Past Surgical History:  Procedure Laterality Date   APPENDECTOMY     BREAST CYST ASPIRATION Right 2014   BREAST EXCISIONAL BIOPSY Left 2008   benign   cataract surgery Bilateral 2020   CESAREAN SECTION     CHOLECYSTECTOMY     FOOT SURGERY     THYROIDECTOMY     uterine fibroid removal      SOCIAL HISTORY: Social History   Socioeconomic History   Marital status: Married    Spouse name: Not on file   Number of children: Not on file   Years of education: Not on file   Highest education level: Not on file  Occupational History   Not on file  Tobacco Use   Smoking status: Never   Smokeless tobacco:  Never   Tobacco comments:    second hand exposure at work  Building services engineer Use: Never used  Substance and Sexual Activity   Alcohol use: Never   Drug use: Never   Sexual activity: Not on file  Other Topics Concern   Not on file  Social History Narrative   Lives at home with husband   Right handed   Caffeine: 1 cup some days   Social Determinants of Corporate investment banker Strain: Not on file  Food Insecurity: Not on file  Transportation Needs: Not on file  Physical Activity: Not on file  Stress: Not on file  Social Connections: Not on file  Intimate Partner Violence: Not on file    FAMILY HISTORY: Family History  Problem Relation Age of Onset   Headache Mother        occasional   Headache Sister    Rheum arthritis Sister    Headache Maternal Aunt        took many medications    Headache Paternal Grandmother    Headache Cousin    Headache Niece    Headache Other    Stroke Neg Hx    Breast cancer Neg Hx     ALLERGIES:  is allergic to ezetimibe, liothyronine, propranolol hcl, atorvastatin, paxil [paroxetine hcl], rosuvastatin calcium,  statins, sulfamethoxazole, and zonisamide.  MEDICATIONS:  Current Outpatient Medications  Medication Sig Dispense Refill   Atogepant (QULIPTA) 30 MG TABS Take 1 tablet (30 mg total) by mouth daily. 30 tablet 5   Cholecalciferol (VITAMIN D3) 50 MCG (2000 UT) capsule Take 2 capsules by mouth daily.     ibuprofen (ADVIL) 200 MG tablet Take by mouth. As needed.     Omega-3 Fatty Acids (OMEGA-3 FISH OIL PO) Take by mouth.     TIROSINT 125 MCG CAPS Take 125 mcg by mouth daily.      Current Facility-Administered Medications  Medication Dose Route Frequency Provider Last Rate Last Admin   Erenumab-aooe SOAJ 140 mg  140 mg Subcutaneous Once Anson Fret, MD        REVIEW OF SYSTEMS:   Constitutional: Profound fatigue   All other systems were reviewed with the patient and are negative.  PHYSICAL EXAMINATION: ECOG  PERFORMANCE STATUS: 2 - Symptomatic, <50% confined to bed  Vitals:   03/16/23 1259  BP: (!) 191/87  Pulse: 84  Resp: 18  Temp: 97.8 F (36.6 C)  SpO2: 97%   Filed Weights   03/16/23 1259  Weight: 179 lb 4.8 oz (81.3 kg)    GENERAL:alert, no distress and comfortable    LABORATORY DATA:  I have reviewed the data as listed Lab Results  Component Value Date   WBC 13.6 (H) 10/19/2022   HGB 12.4 10/19/2022   HCT 40.7 10/19/2022   MCV 90.2 10/19/2022   PLT 358 10/19/2022   Lab Results  Component Value Date   NA 137 10/19/2022   K 3.4 (L) 10/19/2022   CL 101 10/19/2022   CO2 26 10/19/2022    RADIOGRAPHIC STUDIES: I have personally reviewed the radiological reports and agreed with the findings in the report.  ASSESSMENT AND PLAN:  History of iron deficiency anemia Lab review: 02/06/23: Ferritin 6.2, TIBC 432 02/25/2023: Hemoglobin 12.5, MCV 85.8, TIBC 345, iron saturation 15%, ferritin 15  Most likely cause of iron deficiency: Malabsorption.  Patient will make an appointment with her upper endoscopy.  Colonoscopy in September was normal.  Recommendation: 3 doses of IV iron with Venofer for the next 3 weeks. Will follow her up in 2 months with labs and a telephone visit 2 days later.  All questions were answered. The patient knows to call the clinic with any problems, questions or concerns.    Tamsen Meek, MD 03/16/23

## 2023-03-16 NOTE — Assessment & Plan Note (Addendum)
Lab review: 02/06/23: Ferritin 6.2, TIBC 432 02/25/2023: Hemoglobin 12.5, MCV 85.8, TIBC 345, iron saturation 15%, ferritin 15

## 2023-03-18 ENCOUNTER — Encounter: Payer: Self-pay | Admitting: Adult Health

## 2023-03-22 ENCOUNTER — Inpatient Hospital Stay: Payer: Medicare PPO

## 2023-03-22 VITALS — BP 179/92 | HR 86 | Temp 97.7°F | Resp 16

## 2023-03-22 DIAGNOSIS — D509 Iron deficiency anemia, unspecified: Secondary | ICD-10-CM | POA: Diagnosis not present

## 2023-03-22 DIAGNOSIS — Z85850 Personal history of malignant neoplasm of thyroid: Secondary | ICD-10-CM | POA: Diagnosis not present

## 2023-03-22 DIAGNOSIS — R5383 Other fatigue: Secondary | ICD-10-CM | POA: Diagnosis not present

## 2023-03-22 DIAGNOSIS — Z862 Personal history of diseases of the blood and blood-forming organs and certain disorders involving the immune mechanism: Secondary | ICD-10-CM

## 2023-03-22 MED ORDER — SODIUM CHLORIDE 0.9 % IV SOLN
300.0000 mg | Freq: Once | INTRAVENOUS | Status: AC
  Administered 2023-03-22: 300 mg via INTRAVENOUS
  Filled 2023-03-22: qty 300

## 2023-03-22 MED ORDER — SODIUM CHLORIDE 0.9 % IV SOLN: Freq: Once | INTRAVENOUS | Status: AC

## 2023-03-22 NOTE — Patient Instructions (Signed)

## 2023-03-22 NOTE — Progress Notes (Signed)
Per Dr. Pamelia Hoit, okay for patient to proceed with first time venofer iron treatment today with blood pressure 180/96.

## 2023-04-05 ENCOUNTER — Other Ambulatory Visit: Payer: Self-pay

## 2023-04-05 ENCOUNTER — Inpatient Hospital Stay: Payer: Medicare PPO | Attending: Hematology and Oncology

## 2023-04-05 VITALS — BP 178/98 | HR 92 | Temp 98.7°F | Resp 17

## 2023-04-05 DIAGNOSIS — D509 Iron deficiency anemia, unspecified: Secondary | ICD-10-CM | POA: Insufficient documentation

## 2023-04-05 DIAGNOSIS — Z862 Personal history of diseases of the blood and blood-forming organs and certain disorders involving the immune mechanism: Secondary | ICD-10-CM

## 2023-04-05 MED ORDER — SODIUM CHLORIDE 0.9 % IV SOLN: Freq: Once | INTRAVENOUS | Status: AC

## 2023-04-05 MED ORDER — SODIUM CHLORIDE 0.9 % IV SOLN
300.0000 mg | Freq: Once | INTRAVENOUS | Status: AC
  Administered 2023-04-05: 300 mg via INTRAVENOUS
  Filled 2023-04-05: qty 300

## 2023-04-05 NOTE — Progress Notes (Signed)
Doctor aware of patients blood pressure and ok to treat today

## 2023-04-05 NOTE — Progress Notes (Signed)
Patient remained for 30 min post observation.  Tolerated treatment well without incident.  VSS at discharge.  Ambulated to lobby.

## 2023-04-05 NOTE — Patient Instructions (Signed)

## 2023-04-12 ENCOUNTER — Inpatient Hospital Stay: Payer: Medicare PPO

## 2023-04-12 ENCOUNTER — Other Ambulatory Visit: Payer: Self-pay

## 2023-04-12 VITALS — BP 155/106 | HR 99 | Temp 97.6°F | Resp 18

## 2023-04-12 DIAGNOSIS — D509 Iron deficiency anemia, unspecified: Secondary | ICD-10-CM | POA: Diagnosis not present

## 2023-04-12 DIAGNOSIS — Z862 Personal history of diseases of the blood and blood-forming organs and certain disorders involving the immune mechanism: Secondary | ICD-10-CM

## 2023-04-12 MED ORDER — SODIUM CHLORIDE 0.9 % IV SOLN: Freq: Once | INTRAVENOUS | Status: AC

## 2023-04-12 MED ORDER — SODIUM CHLORIDE 0.9 % IV SOLN
300.0000 mg | Freq: Once | INTRAVENOUS | Status: AC
  Administered 2023-04-12: 300 mg via INTRAVENOUS
  Filled 2023-04-12: qty 300

## 2023-04-12 NOTE — Patient Instructions (Signed)

## 2023-04-12 NOTE — Progress Notes (Signed)
Pt declined to stay for 30 minute observation period. VSS at time of discharge.

## 2023-04-19 ENCOUNTER — Encounter: Payer: Self-pay | Admitting: Adult Health

## 2023-04-20 NOTE — Telephone Encounter (Signed)
I called pt she is better today.  Feels like her migraine is going away.  She is ok to increase the qulipta to 60mg .  That it the only thing that has helped her.  She has tried nurtec and [place], other triptans.  Frova is one that has helped her for breakthru migraines.  She is interested in that as well.  She has anemia and had 3 iron infusions. She said she cannot take prednisone.  (Had recently with her knee and does not want to take medrol).  When she has migraine it affects her balance.  (Feels wavy).  This happens every month .  Takes advil and tylenol for breakthru helps some.  Okay for qulipta 60 and frova?

## 2023-04-20 NOTE — Telephone Encounter (Signed)
Can we check to see how the patient is doing today?  We could increase qulipta to 60 mg.   Or if headache has been daily could do a medrol dosepack. Please ask if she has ever had this before and if she tolerates it well?

## 2023-04-21 ENCOUNTER — Encounter: Payer: Self-pay | Admitting: Adult Health

## 2023-04-21 ENCOUNTER — Other Ambulatory Visit: Payer: Self-pay | Admitting: *Deleted

## 2023-04-21 MED ORDER — QULIPTA 60 MG PO TABS: 60.0000 mg | ORAL_TABLET | Freq: Every day | ORAL | 11 refills | Status: DC

## 2023-04-21 NOTE — Telephone Encounter (Signed)
Lets increase qulipta first and see if that helps.

## 2023-05-18 DIAGNOSIS — D539 Nutritional anemia, unspecified: Secondary | ICD-10-CM | POA: Diagnosis not present

## 2023-05-18 DIAGNOSIS — Z6828 Body mass index (BMI) 28.0-28.9, adult: Secondary | ICD-10-CM | POA: Diagnosis not present

## 2023-05-18 DIAGNOSIS — Z0130 Encounter for examination of blood pressure without abnormal findings: Secondary | ICD-10-CM | POA: Diagnosis not present

## 2023-05-18 DIAGNOSIS — G43109 Migraine with aura, not intractable, without status migrainosus: Secondary | ICD-10-CM | POA: Diagnosis not present

## 2023-05-21 DIAGNOSIS — M170 Bilateral primary osteoarthritis of knee: Secondary | ICD-10-CM | POA: Diagnosis not present

## 2023-05-21 DIAGNOSIS — M25562 Pain in left knee: Secondary | ICD-10-CM | POA: Diagnosis not present

## 2023-05-21 DIAGNOSIS — M25561 Pain in right knee: Secondary | ICD-10-CM | POA: Diagnosis not present

## 2023-05-21 DIAGNOSIS — R262 Difficulty in walking, not elsewhere classified: Secondary | ICD-10-CM | POA: Diagnosis not present

## 2023-05-28 DIAGNOSIS — M25562 Pain in left knee: Secondary | ICD-10-CM | POA: Diagnosis not present

## 2023-05-28 DIAGNOSIS — M25561 Pain in right knee: Secondary | ICD-10-CM | POA: Diagnosis not present

## 2023-05-28 DIAGNOSIS — M170 Bilateral primary osteoarthritis of knee: Secondary | ICD-10-CM | POA: Diagnosis not present

## 2023-05-28 DIAGNOSIS — R262 Difficulty in walking, not elsewhere classified: Secondary | ICD-10-CM | POA: Diagnosis not present

## 2023-06-14 ENCOUNTER — Other Ambulatory Visit: Payer: Self-pay

## 2023-06-14 ENCOUNTER — Inpatient Hospital Stay: Payer: Medicare PPO | Attending: Hematology and Oncology

## 2023-06-14 DIAGNOSIS — D509 Iron deficiency anemia, unspecified: Secondary | ICD-10-CM | POA: Insufficient documentation

## 2023-06-14 DIAGNOSIS — Z862 Personal history of diseases of the blood and blood-forming organs and certain disorders involving the immune mechanism: Secondary | ICD-10-CM

## 2023-06-14 DIAGNOSIS — K909 Intestinal malabsorption, unspecified: Secondary | ICD-10-CM | POA: Insufficient documentation

## 2023-06-14 LAB — CBC WITH DIFFERENTIAL (CANCER CENTER ONLY)
Abs Immature Granulocytes: 0.02 10*3/uL (ref 0.00–0.07)
Basophils Absolute: 0.1 10*3/uL (ref 0.0–0.1)
Basophils Relative: 1 %
Eosinophils Absolute: 0.2 10*3/uL (ref 0.0–0.5)
Eosinophils Relative: 3 %
HCT: 42.8 % (ref 36.0–46.0)
Hemoglobin: 13.7 g/dL (ref 12.0–15.0)
Immature Granulocytes: 0 %
Lymphocytes Relative: 24 %
Lymphs Abs: 1.5 10*3/uL (ref 0.7–4.0)
MCH: 27.8 pg (ref 26.0–34.0)
MCHC: 32 g/dL (ref 30.0–36.0)
MCV: 86.8 fL (ref 80.0–100.0)
Monocytes Absolute: 0.4 10*3/uL (ref 0.1–1.0)
Monocytes Relative: 7 %
Neutro Abs: 4 10*3/uL (ref 1.7–7.7)
Neutrophils Relative %: 65 %
Platelet Count: 374 10*3/uL (ref 150–400)
RBC: 4.93 MIL/uL (ref 3.87–5.11)
RDW: 14.7 % (ref 11.5–15.5)
WBC Count: 6.2 10*3/uL (ref 4.0–10.5)
nRBC: 0 % (ref 0.0–0.2)

## 2023-06-14 LAB — IRON AND IRON BINDING CAPACITY (CC-WL,HP ONLY)
Iron: 69 ug/dL (ref 28–170)
Saturation Ratios: 20 % (ref 10.4–31.8)
TIBC: 351 ug/dL (ref 250–450)
UIBC: 282 ug/dL (ref 148–442)

## 2023-06-14 LAB — FERRITIN: Ferritin: 32 ng/mL (ref 11–307)

## 2023-06-15 NOTE — Progress Notes (Signed)
HEMATOLOGY-ONCOLOGY TELEPHONE VISIT PROGRESS NOTE  I connected with our patient on 06/17/23 at  1:45 PM EDT by telephone and verified that I am speaking with the correct person using two identifiers.  I discussed the limitations, risks, security and privacy concerns of performing an evaluation and management service by telephone and the availability of in person appointments.  I also discussed with the patient that there may be a patient responsible charge related to this service. The patient expressed understanding and agreed to proceed.   History of Present Illness: [name] 82 y.o. female is here because of recent diagnosis of iron deficiency.  Patient has been experiencing profound fatigue for the past month.  Patient continues to have worsening fatigue.  She tells me that she felt better after the iron infusion but it has slowly declined.    REVIEW OF SYSTEMS:   Constitutional: Denies fevers, chills or abnormal weight loss All other systems were reviewed with the patient and are negative. Observations/Objective:     Assessment Plan:  History of iron deficiency anemia Lab review: 02/06/23: Ferritin 6.2, TIBC 432 02/25/2023: Hemoglobin 12.5, MCV 85.8, TIBC 345, iron saturation 15%, ferritin 15 06/14/2023: Hemoglobin 13.7, MCV 86.8, iron saturation 20%, ferritin 32   IV iron: May 2024  Most likely cause of iron deficiency: Malabsorption.  Patient will make an appointment with her upper endoscopy.  Colonoscopy in September was normal  Return to clinic in 2 months with labs done ahead of time and follow-up with a telephone visit   I discussed the assessment and treatment plan with the patient. The patient was provided an opportunity to ask questions and all were answered. The patient agreed with the plan and demonstrated an understanding of the instructions. The patient was advised to call back or seek an in-person evaluation if the symptoms worsen or if the condition fails to improve as  anticipated.   I provided 12 minutes of non-face-to-face time during this encounter.  This includes time for charting and coordination of care   Tamsen Meek, MD  I Janan Ridge am acting as a scribe for Dr.Vinay Gudena  I have reviewed the above documentation for accuracy and completeness, and I agree with the above.

## 2023-06-17 ENCOUNTER — Inpatient Hospital Stay (HOSPITAL_BASED_OUTPATIENT_CLINIC_OR_DEPARTMENT_OTHER): Payer: Medicare PPO | Admitting: Hematology and Oncology

## 2023-06-17 DIAGNOSIS — D509 Iron deficiency anemia, unspecified: Secondary | ICD-10-CM | POA: Diagnosis not present

## 2023-06-17 DIAGNOSIS — Z862 Personal history of diseases of the blood and blood-forming organs and certain disorders involving the immune mechanism: Secondary | ICD-10-CM

## 2023-06-17 NOTE — Assessment & Plan Note (Signed)
Lab review: 02/06/23: Ferritin 6.2, TIBC 432 02/25/2023: Hemoglobin 12.5, MCV 85.8, TIBC 345, iron saturation 15%, ferritin 15 06/14/2023: Hemoglobin 13.7, MCV 86.8, iron saturation 20%, ferritin 32   IV iron: May 2024  Most likely cause of iron deficiency: Malabsorption.  Patient will make an appointment with her upper endoscopy.  Colonoscopy in September was normal  Return to clinic in 3 months with labs done ahead of time and follow-up with a telephone visit

## 2023-06-18 ENCOUNTER — Telehealth: Payer: Self-pay | Admitting: Hematology and Oncology

## 2023-06-18 DIAGNOSIS — G43E09 Chronic migraine with aura, not intractable, without status migrainosus: Secondary | ICD-10-CM | POA: Diagnosis not present

## 2023-06-18 NOTE — Telephone Encounter (Signed)
Scheduled appointments per 8/14 los. Left voicemail with appointment details.

## 2023-06-25 DIAGNOSIS — M25561 Pain in right knee: Secondary | ICD-10-CM | POA: Diagnosis not present

## 2023-06-25 DIAGNOSIS — M25562 Pain in left knee: Secondary | ICD-10-CM | POA: Diagnosis not present

## 2023-06-25 DIAGNOSIS — M170 Bilateral primary osteoarthritis of knee: Secondary | ICD-10-CM | POA: Diagnosis not present

## 2023-06-25 DIAGNOSIS — R262 Difficulty in walking, not elsewhere classified: Secondary | ICD-10-CM | POA: Diagnosis not present

## 2023-06-30 DIAGNOSIS — Z862 Personal history of diseases of the blood and blood-forming organs and certain disorders involving the immune mechanism: Secondary | ICD-10-CM | POA: Diagnosis not present

## 2023-06-30 DIAGNOSIS — E611 Iron deficiency: Secondary | ICD-10-CM | POA: Diagnosis not present

## 2023-07-02 DIAGNOSIS — R262 Difficulty in walking, not elsewhere classified: Secondary | ICD-10-CM | POA: Diagnosis not present

## 2023-07-02 DIAGNOSIS — M25561 Pain in right knee: Secondary | ICD-10-CM | POA: Diagnosis not present

## 2023-07-02 DIAGNOSIS — M25562 Pain in left knee: Secondary | ICD-10-CM | POA: Diagnosis not present

## 2023-07-02 DIAGNOSIS — M170 Bilateral primary osteoarthritis of knee: Secondary | ICD-10-CM | POA: Diagnosis not present

## 2023-07-09 DIAGNOSIS — M170 Bilateral primary osteoarthritis of knee: Secondary | ICD-10-CM | POA: Diagnosis not present

## 2023-07-09 DIAGNOSIS — M25561 Pain in right knee: Secondary | ICD-10-CM | POA: Diagnosis not present

## 2023-07-09 DIAGNOSIS — R262 Difficulty in walking, not elsewhere classified: Secondary | ICD-10-CM | POA: Diagnosis not present

## 2023-07-09 DIAGNOSIS — M25562 Pain in left knee: Secondary | ICD-10-CM | POA: Diagnosis not present

## 2023-07-16 DIAGNOSIS — R262 Difficulty in walking, not elsewhere classified: Secondary | ICD-10-CM | POA: Diagnosis not present

## 2023-07-16 DIAGNOSIS — M25562 Pain in left knee: Secondary | ICD-10-CM | POA: Diagnosis not present

## 2023-07-16 DIAGNOSIS — M25561 Pain in right knee: Secondary | ICD-10-CM | POA: Diagnosis not present

## 2023-07-16 DIAGNOSIS — M170 Bilateral primary osteoarthritis of knee: Secondary | ICD-10-CM | POA: Diagnosis not present

## 2023-07-19 DIAGNOSIS — G43E09 Chronic migraine with aura, not intractable, without status migrainosus: Secondary | ICD-10-CM | POA: Diagnosis not present

## 2023-07-21 DIAGNOSIS — K449 Diaphragmatic hernia without obstruction or gangrene: Secondary | ICD-10-CM | POA: Diagnosis not present

## 2023-07-21 DIAGNOSIS — D509 Iron deficiency anemia, unspecified: Secondary | ICD-10-CM | POA: Diagnosis not present

## 2023-07-23 DIAGNOSIS — M170 Bilateral primary osteoarthritis of knee: Secondary | ICD-10-CM | POA: Diagnosis not present

## 2023-07-23 DIAGNOSIS — R262 Difficulty in walking, not elsewhere classified: Secondary | ICD-10-CM | POA: Diagnosis not present

## 2023-07-23 DIAGNOSIS — M25561 Pain in right knee: Secondary | ICD-10-CM | POA: Diagnosis not present

## 2023-07-23 DIAGNOSIS — M25562 Pain in left knee: Secondary | ICD-10-CM | POA: Diagnosis not present

## 2023-07-27 DIAGNOSIS — D539 Nutritional anemia, unspecified: Secondary | ICD-10-CM | POA: Diagnosis not present

## 2023-07-27 DIAGNOSIS — K449 Diaphragmatic hernia without obstruction or gangrene: Secondary | ICD-10-CM | POA: Diagnosis not present

## 2023-07-27 DIAGNOSIS — R030 Elevated blood-pressure reading, without diagnosis of hypertension: Secondary | ICD-10-CM | POA: Diagnosis not present

## 2023-07-27 DIAGNOSIS — G43109 Migraine with aura, not intractable, without status migrainosus: Secondary | ICD-10-CM | POA: Diagnosis not present

## 2023-07-27 DIAGNOSIS — Z6828 Body mass index (BMI) 28.0-28.9, adult: Secondary | ICD-10-CM | POA: Diagnosis not present

## 2023-07-29 DIAGNOSIS — H02403 Unspecified ptosis of bilateral eyelids: Secondary | ICD-10-CM | POA: Diagnosis not present

## 2023-07-29 DIAGNOSIS — I10 Essential (primary) hypertension: Secondary | ICD-10-CM | POA: Diagnosis not present
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
## 2023-08-03 DIAGNOSIS — G43E09 Chronic migraine with aura, not intractable, without status migrainosus: Secondary | ICD-10-CM | POA: Diagnosis not present

## 2023-08-03 DIAGNOSIS — G43709 Chronic migraine without aura, not intractable, without status migrainosus: Secondary | ICD-10-CM | POA: Diagnosis not present

## 2023-08-03 DIAGNOSIS — Z8673 Personal history of transient ischemic attack (TIA), and cerebral infarction without residual deficits: Secondary | ICD-10-CM | POA: Diagnosis not present

## 2023-08-13 NOTE — Progress Notes (Signed)
 Attestation Statement:   I personally saw and evaluated the patient, and participated in the management and treatment plan as documented in the resident/fellow note.  LUPITA CHANCY, MD

## 2023-08-30 ENCOUNTER — Other Ambulatory Visit: Payer: Medicare PPO

## 2023-09-01 ENCOUNTER — Telehealth: Payer: Medicare PPO | Admitting: Hematology and Oncology

## 2023-09-02 ENCOUNTER — Inpatient Hospital Stay: Payer: Medicare PPO | Admitting: Medical Oncology

## 2023-09-02 ENCOUNTER — Encounter: Payer: Self-pay | Admitting: Medical Oncology

## 2023-09-02 ENCOUNTER — Inpatient Hospital Stay: Payer: Medicare PPO | Attending: Hematology and Oncology

## 2023-09-02 VITALS — BP 160/100 | HR 98 | Temp 97.8°F | Resp 18 | Wt 166.1 lb

## 2023-09-02 DIAGNOSIS — Z862 Personal history of diseases of the blood and blood-forming organs and certain disorders involving the immune mechanism: Secondary | ICD-10-CM

## 2023-09-02 DIAGNOSIS — D509 Iron deficiency anemia, unspecified: Secondary | ICD-10-CM

## 2023-09-02 LAB — CMP (CANCER CENTER ONLY)
ALT: 10 U/L (ref 0–44)
AST: 17 U/L (ref 15–41)
Albumin: 4.2 g/dL (ref 3.5–5.0)
Alkaline Phosphatase: 78 U/L (ref 38–126)
Anion gap: 8 (ref 5–15)
BUN: 16 mg/dL (ref 8–23)
CO2: 28 mmol/L (ref 22–32)
Calcium: 9.1 mg/dL (ref 8.9–10.3)
Chloride: 106 mmol/L (ref 98–111)
Creatinine: 0.58 mg/dL (ref 0.44–1.00)
GFR, Estimated: >60 mL/min (ref >60)
Glucose, Bld: 97 mg/dL (ref 70–99)
Potassium: 4 mmol/L (ref 3.5–5.1)
Sodium: 142 mmol/L (ref 135–145)
Total Bilirubin: 0.6 mg/dL (ref 0.3–1.2)
Total Protein: 6.9 g/dL (ref 6.5–8.1)

## 2023-09-02 LAB — CBC
HCT: 43.9 % (ref 36.0–46.0)
Hemoglobin: 14.1 g/dL (ref 12.0–15.0)
MCH: 28.1 pg (ref 26.0–34.0)
MCHC: 32.1 g/dL (ref 30.0–36.0)
MCV: 87.5 fL (ref 80.0–100.0)
Platelets: 288 10*3/uL (ref 150–400)
RBC: 5.02 MIL/uL (ref 3.87–5.11)
RDW: 13 % (ref 11.5–15.5)
WBC: 6.1 10*3/uL (ref 4.0–10.5)
nRBC: 0 % (ref 0.0–0.2)

## 2023-09-02 LAB — RETIC PANEL
Immature Retic Fract: 4.5 % (ref 2.3–15.9)
RBC.: 5.09 MIL/uL (ref 3.87–5.11)
Retic Count, Absolute: 71.8 10*3/uL (ref 19.0–186.0)
Retic Ct Pct: 1.4 % (ref 0.4–3.1)
Reticulocyte Hemoglobin: 31.7 pg (ref >27.9)

## 2023-09-02 LAB — FERRITIN: Ferritin: 18 ng/mL (ref 11–307)

## 2023-09-02 NOTE — Progress Notes (Signed)
Hematology and Oncology Follow Up Visit  MARJON RITACCO [id number] 11/25/1940 82 y.o. 09/02/2023  Past Medical History:  Diagnosis Date   Hashimoto's thyroiditis    Hypothyroidism    Migraines    Thyroid cancer (HCC)     Principle Diagnosis:  IDA suspected to be secondary to malabsorption  Current Therapy:   IV Iron- Venofer 300 mg last dose 04/12/2023    Interim History:  Ms. Gloria is here to establish care with our office. Formerly seen at [facility] by Dr. Pamelia Hoit.   She has a longstanding history of low ferritin levels.   At the time of her initial visit with Dr. Pamelia Hoit labs showed:  02/06/23: Ferritin 6.2, TIBC 432 02/25/2023: Hemoglobin 12.5, MCV 85.8, TIBC 345, iron saturation 15%, ferritin 15 06/14/2023: Hemoglobin 13.7, MCV 86.8, iron saturation 20%, ferritin 32   She is now also taking IronRepair 20 mg once daily. She started this after her last iron infusion series.   She reports that in September she had an endoscopy which showed a medium hiatal hernia. Colonoscopy of 2023 was normal with diverticula.   She reports that she still struggles with fatigue. She has had a sleep study - [facility]) 2023-which did not show any significant apnea.   There has been no bleeding to her knowledge: denies epistaxis, gingivitis, hemoptysis, hematemesis, hematuria, melena, excessive bruising, blood donation.   No SOB, chest pain.    Wt Readings from Last 3 Encounters:  09/02/23 166 lb 1.9 oz (75.4 kg)  03/16/23 179 lb 4.8 oz (81.3 kg)  02/25/23 178 lb 9.6 oz (81 kg)     Medications:   Current Outpatient Medications:    Atogepant (QULIPTA) 60 MG TABS, Take 1 tablet (60 mg total) by mouth daily. (Patient not taking: Reported on 09/02/2023), Disp: 30 tablet, Rfl: 11   Cholecalciferol (VITAMIN D3) 50 MCG (2000 UT) capsule, Take 2 capsules by mouth daily., Disp: , Rfl:    diphenhydramine-acetaminophen (TYLENOL PM) 25-500 MG TABS tablet, Take 1 tablet by  mouth at bedtime as needed., Disp: , Rfl:    ibuprofen (ADVIL) 200 MG tablet, Take by mouth. As needed., Disp: , Rfl:    Multiple Vitamin (MULTI-VITAMIN) tablet, Take 1 tablet by mouth daily., Disp: , Rfl:    NURTEC 75 MG TBDP, Take 37.5 mg by mouth as needed (for migraines)., Disp: , Rfl:    Omega-3 Fatty Acids (OMEGA-3 FISH OIL PO), Take by mouth., Disp: , Rfl:    OVER THE COUNTER MEDICATION, IronRepair 20 mg daily, Disp: , Rfl:    TIROSINT 125 MCG CAPS, Take 125 mcg by mouth daily. , Disp: , Rfl:    Atogepant (QULIPTA) 30 MG TABS, Take 1 tablet (30 mg total) by mouth daily. (Patient not taking: Reported on 09/02/2023), Disp: 30 tablet, Rfl: 5  Current Facility-Administered Medications:    Erenumab-aooe SOAJ 140 mg, 140 mg, Subcutaneous, Once, Anson Fret, MD  Allergies:  Allergies  Allergen Reactions   Celebrex [Celecoxib] Swelling    Swelling of ankles and feet for two weeks   Ezetimibe     Other Reaction(s): muscle pain   Liothyronine     Other Reaction(s): upset stomach   Propranolol Hcl     Other Reaction(s): Bloated   Atorvastatin     Other reaction(s): Myalgias (muscle pain) Other reaction(s): Myalgias (intolerance)   Paxil [Paroxetine Hcl] Other (See Comments)    syncope   Rosuvastatin Calcium Other (See Comments)   Statins    Sulfamethoxazole  Other (See Comments)   Zonisamide Other (See Comments)    Past Medical History, Surgical history, Social history, and Family History were reviewed and updated.  Review of Systems: As stated above in HPI   Physical Exam:  weight is 166 lb 1.9 oz (75.4 kg). Her oral temperature is 97.8 F (36.6 C). Her blood pressure is 160/100 (abnormal) and her pulse is 98. Her respiration is 18 and oxygen saturation is 97%.   Physical Exam General: NAD Cardiovascular: regular rate and rhythm Pulmonary: clear ant fields Abdomen: soft, nontender, + bowel sounds GU: no suprapubic tenderness Extremities: no edema, no joint  deformities Skin: no rashes Neurological: Weakness but otherwise nonfocal   Lab Results  Component Value Date   WBC 6.2 06/14/2023   HGB 13.7 06/14/2023   HCT 42.8 06/14/2023   MCV 86.8 06/14/2023   PLT 374 06/14/2023     Chemistry      Component Value Date/Time   NA 137 10/19/2022 1412   K 3.4 (L) 10/19/2022 1412   CL 101 10/19/2022 1412   CO2 26 10/19/2022 1412   BUN 22 10/19/2022 1412   CREATININE 0.79 10/19/2022 1412      Component Value Date/Time   CALCIUM 8.8 (L) 10/19/2022 1412     Encounter Diagnosis  Name Primary?   History of iron deficiency anemia Yes    Assessment and Plan- Patient is a 82 y.o. female with IDA thought to be secondary to malabsorption. She has had a recent endoscopy and colonoscopy. She has not yet had a capsule study.   Today I discussed that I would recommend a capsule study if she were interested. I would advise that she consider switching her supplement to Fusion Plus which she would take every morning. We will get updated labs today. She likely will need addition IV iron. We will plan on seeing her every 2-3 months to get her iron levels stable.    Disposition: Labs today RTC 2 months APP, labs (CBC, iron, ferritin, retic)-[place]    Clent Jacks PA-C 10/31/20241:41 PM

## 2023-09-03 LAB — IRON AND IRON BINDING CAPACITY (CC-WL,HP ONLY)
Iron: 74 ug/dL (ref 28–170)
Saturation Ratios: 21 % (ref 10.4–31.8)
TIBC: 347 ug/dL (ref 250–450)
UIBC: 273 ug/dL (ref 148–442)

## 2023-09-06 ENCOUNTER — Other Ambulatory Visit: Payer: Self-pay | Admitting: Medical Oncology

## 2023-09-07 ENCOUNTER — Telehealth: Payer: Self-pay | Admitting: *Deleted

## 2023-09-07 NOTE — Telephone Encounter (Signed)
Per scheduling message Maralyn Sago - Called patient and lvm for callback to schedule (3) doses of IV Iron - Venofer

## 2023-09-08 ENCOUNTER — Ambulatory Visit: Payer: Medicare PPO | Admitting: Adult Health

## 2023-09-09 DIAGNOSIS — R030 Elevated blood-pressure reading, without diagnosis of hypertension: Secondary | ICD-10-CM | POA: Diagnosis not present

## 2023-09-09 DIAGNOSIS — R262 Difficulty in walking, not elsewhere classified: Secondary | ICD-10-CM | POA: Diagnosis not present

## 2023-09-09 DIAGNOSIS — G43109 Migraine with aura, not intractable, without status migrainosus: Secondary | ICD-10-CM | POA: Diagnosis not present

## 2023-09-09 DIAGNOSIS — Z6828 Body mass index (BMI) 28.0-28.9, adult: Secondary | ICD-10-CM | POA: Diagnosis not present

## 2023-09-09 DIAGNOSIS — M25662 Stiffness of left knee, not elsewhere classified: Secondary | ICD-10-CM | POA: Diagnosis not present

## 2023-09-09 DIAGNOSIS — M25562 Pain in left knee: Secondary | ICD-10-CM | POA: Diagnosis not present

## 2023-09-09 DIAGNOSIS — M1712 Unilateral primary osteoarthritis, left knee: Secondary | ICD-10-CM | POA: Diagnosis not present

## 2023-09-16 DIAGNOSIS — N950 Postmenopausal bleeding: Secondary | ICD-10-CM | POA: Diagnosis not present

## 2023-09-17 ENCOUNTER — Inpatient Hospital Stay: Payer: Medicare PPO | Attending: Hematology and Oncology

## 2023-09-17 VITALS — BP 163/88 | HR 88 | Temp 97.9°F | Resp 18

## 2023-09-17 DIAGNOSIS — D509 Iron deficiency anemia, unspecified: Secondary | ICD-10-CM | POA: Diagnosis not present

## 2023-09-17 DIAGNOSIS — Z862 Personal history of diseases of the blood and blood-forming organs and certain disorders involving the immune mechanism: Secondary | ICD-10-CM

## 2023-09-17 MED ORDER — SODIUM CHLORIDE 0.9 % IV SOLN
300.0000 mg | Freq: Once | INTRAVENOUS | Status: AC
  Administered 2023-09-17: 300 mg via INTRAVENOUS
  Filled 2023-09-17: qty 300

## 2023-09-17 MED ORDER — SODIUM CHLORIDE 0.9 % IV SOLN: INTRAVENOUS | Status: DC

## 2023-09-17 NOTE — Progress Notes (Signed)
Patient refused to wait 30 minutes post infusion. Released stable and ASX. 

## 2023-09-17 NOTE — Patient Instructions (Signed)
Iron Sucrose Injection What is this medication? IRON SUCROSE (EYE ern SOO krose) treats low levels of iron (iron deficiency anemia) in people with kidney disease. Iron is a mineral that plays an important role in making red blood cells, which carry oxygen from your lungs to the rest of your body. This medicine may be used for other purposes; ask your health care provider or pharmacist if you have questions. COMMON BRAND NAME(S): Venofer What should I tell my care team before I take this medication? They need to know if you have any of these conditions: Anemia not caused by low iron levels Heart disease High levels of iron in the blood Kidney disease Liver disease An unusual or allergic reaction to iron, other medications, foods, dyes, or preservatives Pregnant or trying to get pregnant Breastfeeding How should I use this medication? This medication is for infusion into a vein. It is given in a hospital or clinic setting. Talk to your care team about the use of this medication in children. While this medication may be prescribed for children as young as 2 years for selected conditions, precautions do apply. Overdosage: If you think you have taken too much of this medicine contact a poison control center or emergency room at once. NOTE: This medicine is only for you. Do not share this medicine with others. What if I miss a dose? Keep appointments for follow-up doses. It is important not to miss your dose. Call your care team if you are unable to keep an appointment. What may interact with this medication? Do not take this medication with any of the following: Deferoxamine Dimercaprol Other iron products This medication may also interact with the following: Chloramphenicol Deferasirox This list may not describe all possible interactions. Give your health care provider a list of all the medicines, herbs, non-prescription drugs, or dietary supplements you use. Also tell them if you smoke,  drink alcohol, or use illegal drugs. Some items may interact with your medicine. What should I watch for while using this medication? Visit your care team regularly. Tell your care team if your symptoms do not start to get better or if they get worse. You may need blood work done while you are taking this medication. You may need to follow a special diet. Talk to your care team. Foods that contain iron include: whole grains/cereals, dried fruits, beans, or peas, leafy green vegetables, and organ meats (liver, kidney). What side effects may I notice from receiving this medication? Side effects that you should report to your care team as soon as possible: Allergic reactions--skin rash, itching, hives, swelling of the face, lips, tongue, or throat Low blood pressure--dizziness, feeling faint or lightheaded, blurry vision Shortness of breath Side effects that usually do not require medical attention (report to your care team if they continue or are bothersome): Flushing Headache Joint pain Muscle pain Nausea Pain, redness, or irritation at injection site This list may not describe all possible side effects. Call your doctor for medical advice about side effects. You may report side effects to FDA at 1-800-FDA-1088. Where should I keep my medication? This medication is given in a hospital or clinic. It will not be stored at home. NOTE: This sheet is a summary. It may not cover all possible information. If you have questions about this medicine, talk to your doctor, pharmacist, or health care provider.  2024 Elsevier/Gold Standard (2023-03-26 00:00:00)

## 2023-09-20 DIAGNOSIS — I10 Essential (primary) hypertension: Secondary | ICD-10-CM | POA: Diagnosis not present

## 2023-09-20 DIAGNOSIS — R519 Headache, unspecified: Secondary | ICD-10-CM | POA: Diagnosis not present

## 2023-09-20 DIAGNOSIS — H26493 Other secondary cataract, bilateral: Secondary | ICD-10-CM | POA: Diagnosis not present

## 2023-09-23 DIAGNOSIS — K449 Diaphragmatic hernia without obstruction or gangrene: Secondary | ICD-10-CM | POA: Diagnosis not present

## 2023-09-23 DIAGNOSIS — R030 Elevated blood-pressure reading, without diagnosis of hypertension: Secondary | ICD-10-CM | POA: Diagnosis not present

## 2023-09-23 DIAGNOSIS — Z6828 Body mass index (BMI) 28.0-28.9, adult: Secondary | ICD-10-CM | POA: Diagnosis not present

## 2023-09-23 DIAGNOSIS — F419 Anxiety disorder, unspecified: Secondary | ICD-10-CM | POA: Diagnosis not present

## 2023-09-23 DIAGNOSIS — I10 Essential (primary) hypertension: Secondary | ICD-10-CM | POA: Diagnosis not present

## 2023-09-23 DIAGNOSIS — D509 Iron deficiency anemia, unspecified: Secondary | ICD-10-CM | POA: Diagnosis not present

## 2023-09-23 DIAGNOSIS — G43109 Migraine with aura, not intractable, without status migrainosus: Secondary | ICD-10-CM | POA: Diagnosis not present

## 2023-09-24 ENCOUNTER — Inpatient Hospital Stay: Payer: Medicare PPO

## 2023-10-08 ENCOUNTER — Inpatient Hospital Stay: Payer: Medicare PPO | Attending: Hematology and Oncology

## 2023-10-08 VITALS — BP 158/83 | HR 83 | Temp 97.9°F | Resp 17

## 2023-10-08 DIAGNOSIS — D509 Iron deficiency anemia, unspecified: Secondary | ICD-10-CM | POA: Insufficient documentation

## 2023-10-08 DIAGNOSIS — I10 Essential (primary) hypertension: Secondary | ICD-10-CM | POA: Diagnosis not present

## 2023-10-08 DIAGNOSIS — Z862 Personal history of diseases of the blood and blood-forming organs and certain disorders involving the immune mechanism: Secondary | ICD-10-CM

## 2023-10-08 MED ORDER — SODIUM CHLORIDE 0.9 % IV SOLN
300.0000 mg | Freq: Once | INTRAVENOUS | Status: AC
  Administered 2023-10-08: 300 mg via INTRAVENOUS
  Filled 2023-10-08: qty 300

## 2023-10-08 MED ORDER — SODIUM CHLORIDE 0.9 % IV SOLN: INTRAVENOUS | Status: DC

## 2023-10-08 NOTE — Patient Instructions (Signed)
Iron Sucrose Injection What is this medication? IRON SUCROSE (EYE ern SOO krose) treats low levels of iron (iron deficiency anemia) in people with kidney disease. Iron is a mineral that plays an important role in making red blood cells, which carry oxygen from your lungs to the rest of your body. This medicine may be used for other purposes; ask your health care provider or pharmacist if you have questions. COMMON BRAND NAME(S): Venofer What should I tell my care team before I take this medication? They need to know if you have any of these conditions: Anemia not caused by low iron levels Heart disease High levels of iron in the blood Kidney disease Liver disease An unusual or allergic reaction to iron, other medications, foods, dyes, or preservatives Pregnant or trying to get pregnant Breastfeeding How should I use this medication? This medication is for infusion into a vein. It is given in a hospital or clinic setting. Talk to your care team about the use of this medication in children. While this medication may be prescribed for children as young as 2 years for selected conditions, precautions do apply. Overdosage: If you think you have taken too much of this medicine contact a poison control center or emergency room at once. NOTE: This medicine is only for you. Do not share this medicine with others. What if I miss a dose? Keep appointments for follow-up doses. It is important not to miss your dose. Call your care team if you are unable to keep an appointment. What may interact with this medication? Do not take this medication with any of the following: Deferoxamine Dimercaprol Other iron products This medication may also interact with the following: Chloramphenicol Deferasirox This list may not describe all possible interactions. Give your health care provider a list of all the medicines, herbs, non-prescription drugs, or dietary supplements you use. Also tell them if you smoke,  drink alcohol, or use illegal drugs. Some items may interact with your medicine. What should I watch for while using this medication? Visit your care team regularly. Tell your care team if your symptoms do not start to get better or if they get worse. You may need blood work done while you are taking this medication. You may need to follow a special diet. Talk to your care team. Foods that contain iron include: whole grains/cereals, dried fruits, beans, or peas, leafy green vegetables, and organ meats (liver, kidney). What side effects may I notice from receiving this medication? Side effects that you should report to your care team as soon as possible: Allergic reactions--skin rash, itching, hives, swelling of the face, lips, tongue, or throat Low blood pressure--dizziness, feeling faint or lightheaded, blurry vision Shortness of breath Side effects that usually do not require medical attention (report to your care team if they continue or are bothersome): Flushing Headache Joint pain Muscle pain Nausea Pain, redness, or irritation at injection site This list may not describe all possible side effects. Call your doctor for medical advice about side effects. You may report side effects to FDA at 1-800-FDA-1088. Where should I keep my medication? This medication is given in a hospital or clinic. It will not be stored at home. NOTE: This sheet is a summary. It may not cover all possible information. If you have questions about this medicine, talk to your doctor, pharmacist, or health care provider.  2024 Elsevier/Gold Standard (2023-03-26 00:00:00)
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
## 2023-10-11 DIAGNOSIS — G43E09 Chronic migraine with aura, not intractable, without status migrainosus: Secondary | ICD-10-CM | POA: Diagnosis not present

## 2023-10-13 NOTE — Progress Notes (Signed)
 Attestation Statement:   This service was rendered under my overall direction and control, and I was immediately available via phone/pager or present on site.  EVALENE DELENA COLLET, MD

## 2023-10-14 ENCOUNTER — Other Ambulatory Visit: Payer: Self-pay

## 2023-10-14 DIAGNOSIS — Z862 Personal history of diseases of the blood and blood-forming organs and certain disorders involving the immune mechanism: Secondary | ICD-10-CM

## 2023-10-15 ENCOUNTER — Inpatient Hospital Stay: Payer: Medicare PPO

## 2023-10-15 VITALS — BP 153/82 | HR 72 | Temp 98.0°F | Resp 19

## 2023-10-15 DIAGNOSIS — Z862 Personal history of diseases of the blood and blood-forming organs and certain disorders involving the immune mechanism: Secondary | ICD-10-CM

## 2023-10-15 DIAGNOSIS — I10 Essential (primary) hypertension: Secondary | ICD-10-CM | POA: Diagnosis not present

## 2023-10-15 DIAGNOSIS — D509 Iron deficiency anemia, unspecified: Secondary | ICD-10-CM | POA: Diagnosis not present

## 2023-10-15 MED ORDER — SODIUM CHLORIDE 0.9 % IV SOLN
300.0000 mg | Freq: Once | INTRAVENOUS | Status: AC
  Administered 2023-10-15: 300 mg via INTRAVENOUS
  Filled 2023-10-15: qty 300

## 2023-10-15 MED ORDER — SODIUM CHLORIDE 0.9 % IV SOLN: INTRAVENOUS | Status: DC

## 2023-10-18 DIAGNOSIS — Z0000 Encounter for general adult medical examination without abnormal findings: Secondary | ICD-10-CM | POA: Diagnosis not present

## 2023-10-18 DIAGNOSIS — E890 Postprocedural hypothyroidism: Secondary | ICD-10-CM | POA: Diagnosis not present

## 2023-10-18 DIAGNOSIS — E7800 Pure hypercholesterolemia, unspecified: Secondary | ICD-10-CM | POA: Diagnosis not present

## 2023-10-18 DIAGNOSIS — Z9181 History of falling: Secondary | ICD-10-CM | POA: Diagnosis not present

## 2023-10-18 DIAGNOSIS — G43109 Migraine with aura, not intractable, without status migrainosus: Secondary | ICD-10-CM | POA: Diagnosis not present

## 2023-11-01 ENCOUNTER — Inpatient Hospital Stay (HOSPITAL_BASED_OUTPATIENT_CLINIC_OR_DEPARTMENT_OTHER): Payer: Medicare PPO | Admitting: Medical Oncology

## 2023-11-01 ENCOUNTER — Encounter: Payer: Self-pay | Admitting: Medical Oncology

## 2023-11-01 ENCOUNTER — Other Ambulatory Visit: Payer: Self-pay

## 2023-11-01 ENCOUNTER — Inpatient Hospital Stay: Payer: Medicare PPO

## 2023-11-01 ENCOUNTER — Other Ambulatory Visit: Payer: Self-pay | Admitting: Medical Oncology

## 2023-11-01 VITALS — BP 158/93 | HR 81 | Temp 97.9°F | Resp 18 | Wt 165.1 lb

## 2023-11-01 DIAGNOSIS — Z862 Personal history of diseases of the blood and blood-forming organs and certain disorders involving the immune mechanism: Secondary | ICD-10-CM

## 2023-11-01 DIAGNOSIS — I10 Essential (primary) hypertension: Secondary | ICD-10-CM | POA: Diagnosis not present

## 2023-11-01 DIAGNOSIS — D509 Iron deficiency anemia, unspecified: Secondary | ICD-10-CM | POA: Diagnosis not present

## 2023-11-01 LAB — CBC
HCT: 43.9 % (ref 36.0–46.0)
Hemoglobin: 14.3 g/dL (ref 12.0–15.0)
MCH: 29 pg (ref 26.0–34.0)
MCHC: 32.6 g/dL (ref 30.0–36.0)
MCV: 89 fL (ref 80.0–100.0)
Platelets: 283 10*3/uL (ref 150–400)
RBC: 4.93 MIL/uL (ref 3.87–5.11)
RDW: 13.5 % (ref 11.5–15.5)
WBC: 5.6 10*3/uL (ref 4.0–10.5)
nRBC: 0 % (ref 0.0–0.2)

## 2023-11-01 LAB — IRON AND IRON BINDING CAPACITY (CC-WL,HP ONLY)
Iron: 102 ug/dL (ref 28–170)
Saturation Ratios: 36 % — ABNORMAL HIGH (ref 10.4–31.8)
TIBC: 281 ug/dL (ref 250–450)
UIBC: 179 ug/dL (ref 148–442)

## 2023-11-01 LAB — FERRITIN: Ferritin: 118 ng/mL (ref 11–307)

## 2023-11-01 NOTE — Progress Notes (Signed)
Hematology and Oncology Follow Up Visit  [name] [id number] September 09, 1941 82 y.o. 11/01/2023  Past Medical History:  Diagnosis Date   Hashimoto's thyroiditis    Hypothyroidism    Migraines    Thyroid cancer (HCC)     Principle Diagnosis:  IDA suspected to be secondary to malabsorption  Current Therapy:   IV Iron- Venofer 300 mg last dose 04/12/2023    Interim History:  [name] is here for IDA follow up. Formerly seen at [facility] by Dr. Pamelia Hoit. She is here with her husband.   She has a longstanding history of low ferritin levels. Most recent iron history shown below: 02/06/23: Ferritin 6.2, TIBC 432 02/25/2023: Hemoglobin 12.5, MCV 85.8, TIBC 345, iron saturation 15%, ferritin 15 06/14/2023: Hemoglobin 13.7, MCV 86.8, iron saturation 20%, ferritin 32 09/02/2023: Hgb 14.1, iron sat 21%, ferritin 18  Her most recent iron infusion was IV Venofer given on 10/08/2023 and 10/15/2023.    She is also taking IronRepair 20 mg once daily. At her last visit I suggested switching to Fusion Plus in the morning to help with iron levels.   She reports that in September she had an endoscopy which showed a medium hiatal hernia. Colonoscopy of 2023 was normal with diverticula. I suggested a possible Capsule study at her last visit  Chronic fatigue is still present. She has had a sleep study - [facility]) 2023-which did not show any significant apnea.   There has been no bleeding to her knowledge: denies epistaxis, gingivitis, hemoptysis, hematemesis, hematuria, melena, excessive bruising, blood donation.   No SOB, chest pain or pica. No abdominal pains.   Wt Readings from Last 3 Encounters:  11/01/23 165 lb 0.8 oz (74.9 kg)  09/02/23 166 lb 1.9 oz (75.4 kg)  03/16/23 179 lb 4.8 oz (81.3 kg)     Medications:   Current Outpatient Medications:    Cholecalciferol (VITAMIN D3) 50 MCG (2000 UT) capsule, Take 2 capsules by mouth daily., Disp: , Rfl:     diphenhydramine-acetaminophen (TYLENOL PM) 25-500 MG TABS tablet, Take 1 tablet by mouth at bedtime as needed., Disp: , Rfl:    ibuprofen (ADVIL) 200 MG tablet, Take by mouth. As needed., Disp: , Rfl:    Multiple Vitamin (MULTI-VITAMIN) tablet, Take 1 tablet by mouth daily., Disp: , Rfl:    NURTEC 75 MG TBDP, Take 37.5 mg by mouth as needed (for migraines)., Disp: , Rfl:    Omega-3 Fatty Acids (OMEGA-3 FISH OIL PO), Take by mouth., Disp: , Rfl:    OVER THE COUNTER MEDICATION, IronRepair 20 mg daily, Disp: , Rfl:    TIROSINT 125 MCG CAPS, Take 125 mcg by mouth daily. , Disp: , Rfl:   Current Facility-Administered Medications:    Erenumab-aooe SOAJ 140 mg, 140 mg, Subcutaneous, Once, Anson Fret, MD  Allergies:  Allergies  Allergen Reactions   Celebrex [Celecoxib] Swelling    Swelling of ankles and feet for two weeks   Ezetimibe     Other Reaction(s): muscle pain   Liothyronine     Other Reaction(s): upset stomach   Propranolol Hcl     Other Reaction(s): Bloated   Atorvastatin     Other reaction(s): Myalgias (muscle pain) Other reaction(s): Myalgias (intolerance)   Paxil [Paroxetine Hcl] Other (See Comments)    syncope   Rosuvastatin Calcium Other (See Comments)   Statins    Sulfamethoxazole Other (See Comments)   Zonisamide Other (See Comments)    Past Medical History, Surgical history, Social  history, and Family History were reviewed and updated.  Review of Systems: As stated above in HPI   Physical Exam:  weight is 165 lb 0.8 oz (74.9 kg). Her oral temperature is 97.9 F (36.6 C). Her blood pressure is 158/93 (abnormal) and her pulse is 81. Her respiration is 18 and oxygen saturation is 98%.   Physical Exam General: NAD Cardiovascular: regular rate and rhythm Pulmonary: clear ant fields Abdomen: soft, nontender, + bowel sounds GU: no suprapubic tenderness Extremities: no edema, no joint deformities Skin: no rashes Neurological: Weakness but otherwise  nonfocal   Lab Results  Component Value Date   WBC 5.6 11/01/2023   HGB 14.3 11/01/2023   HCT 43.9 11/01/2023   MCV 89.0 11/01/2023   PLT 283 11/01/2023     Chemistry      Component Value Date/Time   NA 142 09/02/2023 1423   K 4.0 09/02/2023 1423   CL 106 09/02/2023 1423   CO2 28 09/02/2023 1423   BUN 16 09/02/2023 1423   CREATININE 0.58 09/02/2023 1423      Component Value Date/Time   CALCIUM 9.1 09/02/2023 1423   ALKPHOS 78 09/02/2023 1423   AST 17 09/02/2023 1423   ALT 10 09/02/2023 1423   BILITOT 0.6 09/02/2023 1423     Encounter Diagnosis  Name Primary?   History of iron deficiency anemia Yes   Assessment and Plan- Patient is a 82 y.o. female with IDA thought to be secondary to malabsorption. She has had a recent endoscopy and colonoscopy. She has not yet had a capsule study.   Hgb stable today.  Iron studies pending. Will replace if needed She has white coat hypertension. Home readings are around 120/80  Disposition: RTC 2 months APP, labs (CBC, iron, ferritin, retic)-[place]    Clent Jacks PA-C 12/30/202411:26 AM

## 2023-12-27 ENCOUNTER — Encounter: Payer: Self-pay | Admitting: Medical Oncology

## 2023-12-27 ENCOUNTER — Inpatient Hospital Stay: Payer: Medicare PPO | Attending: Hematology and Oncology

## 2023-12-27 ENCOUNTER — Inpatient Hospital Stay: Payer: Medicare PPO | Admitting: Medical Oncology

## 2023-12-27 VITALS — BP 176/82 | HR 70 | Temp 98.0°F | Resp 18 | Ht 62.0 in | Wt 163.1 lb

## 2023-12-27 DIAGNOSIS — R030 Elevated blood-pressure reading, without diagnosis of hypertension: Secondary | ICD-10-CM | POA: Diagnosis not present

## 2023-12-27 DIAGNOSIS — D509 Iron deficiency anemia, unspecified: Secondary | ICD-10-CM | POA: Insufficient documentation

## 2023-12-27 DIAGNOSIS — E611 Iron deficiency: Secondary | ICD-10-CM | POA: Diagnosis not present

## 2023-12-27 DIAGNOSIS — I10 Essential (primary) hypertension: Secondary | ICD-10-CM | POA: Diagnosis not present

## 2023-12-27 DIAGNOSIS — Z85850 Personal history of malignant neoplasm of thyroid: Secondary | ICD-10-CM | POA: Diagnosis not present

## 2023-12-27 DIAGNOSIS — E063 Autoimmune thyroiditis: Secondary | ICD-10-CM | POA: Diagnosis not present

## 2023-12-27 DIAGNOSIS — E639 Nutritional deficiency, unspecified: Secondary | ICD-10-CM | POA: Diagnosis not present

## 2023-12-27 DIAGNOSIS — Z862 Personal history of diseases of the blood and blood-forming organs and certain disorders involving the immune mechanism: Secondary | ICD-10-CM

## 2023-12-27 LAB — CBC WITH DIFFERENTIAL (CANCER CENTER ONLY)
Abs Immature Granulocytes: 0.04 10*3/uL (ref 0.00–0.07)
Basophils Absolute: 0 10*3/uL (ref 0.0–0.1)
Basophils Relative: 1 %
Eosinophils Absolute: 0.2 10*3/uL (ref 0.0–0.5)
Eosinophils Relative: 2 %
HCT: 46.1 % — ABNORMAL HIGH (ref 36.0–46.0)
Hemoglobin: 15.1 g/dL — ABNORMAL HIGH (ref 12.0–15.0)
Immature Granulocytes: 1 %
Lymphocytes Relative: 17 %
Lymphs Abs: 1.4 10*3/uL (ref 0.7–4.0)
MCH: 29.5 pg (ref 26.0–34.0)
MCHC: 32.8 g/dL (ref 30.0–36.0)
MCV: 90.2 fL (ref 80.0–100.0)
Monocytes Absolute: 0.5 10*3/uL (ref 0.1–1.0)
Monocytes Relative: 6 %
Neutro Abs: 6.1 10*3/uL (ref 1.7–7.7)
Neutrophils Relative %: 73 %
Platelet Count: 237 10*3/uL (ref 150–400)
RBC: 5.11 MIL/uL (ref 3.87–5.11)
RDW: 13.3 % (ref 11.5–15.5)
WBC Count: 8.3 10*3/uL (ref 4.0–10.5)
nRBC: 0 % (ref 0.0–0.2)

## 2023-12-27 LAB — IRON AND IRON BINDING CAPACITY (CC-WL,HP ONLY)
Iron: 145 ug/dL (ref 28–170)
Saturation Ratios: 49 % — ABNORMAL HIGH (ref 10.4–31.8)
TIBC: 294 ug/dL (ref 250–450)
UIBC: 149 ug/dL (ref 148–442)

## 2023-12-27 LAB — FERRITIN: Ferritin: 104 ng/mL (ref 11–307)

## 2023-12-27 NOTE — Progress Notes (Signed)
 Hematology and Oncology Follow Up Visit  HEDI BARKAN [id number] Jan 19, 1941 83 y.o. 12/27/2023  Past Medical History:  Diagnosis Date   Hashimoto's thyroiditis    Hypothyroidism    Migraines    Thyroid cancer (HCC)     Principle Diagnosis:  IDA suspected to be secondary to malabsorption  Current Therapy:   IV Iron- Venofer 300 mg last dose 10/15/2023    Interim History:  Ms. Hollon is here for IDA follow up. Formerly seen at [facility] by Dr. Pamelia Hoit. She is here with her husband.   She has a longstanding history of low ferritin levels. Most recent iron history shown below: 02/06/23: Ferritin 6.2, TIBC 432 02/25/2023: Hemoglobin 12.5, MCV 85.8, TIBC 345, iron saturation 15%, ferritin 15 06/14/2023: Hemoglobin 13.7, MCV 86.8, iron saturation 20%, ferritin 32 09/02/2023: Hgb 14.1, iron sat 21%, ferritin 18  Her most recent iron infusion was IV Venofer given on 10/08/2023 and 10/15/2023.    She is also taking IronRepair 20 mg which she takes sporadically due nausea/constipation.   She reports that in September she had an endoscopy which showed a medium hiatal hernia. Colonoscopy of 2023 was normal with diverticula. I suggested a possible Capsule study at her last visit. Since our last visit she has seen GI and recent fecal test did not show any blood.   Overall fatigue and SOB is significantly better since getting IV iron. She has tolerated this well without side effects. There has been no bleeding to her knowledge: denies epistaxis, gingivitis, hemoptysis, hematemesis, hematuria, melena, excessive bruising, blood donation.   She has had a sleep study - [facility]) 2023-which did not show any significant apnea.   No SOB, chest pain or pica. No abdominal pains.   Wt Readings from Last 3 Encounters:  12/27/23 163 lb 1.3 oz (74 kg)  11/01/23 165 lb 0.8 oz (74.9 kg)  09/02/23 166 lb 1.9 oz (75.4 kg)     Medications:   Current Outpatient Medications:     Cholecalciferol (VITAMIN D3) 50 MCG (2000 UT) capsule, Take 2 capsules by mouth daily., Disp: , Rfl:    diphenhydramine-acetaminophen (TYLENOL PM) 25-500 MG TABS tablet, Take 1 tablet by mouth at bedtime as needed., Disp: , Rfl:    gabapentin (NEURONTIN) 100 MG capsule, Take 100-200 mg by mouth at bedtime., Disp: , Rfl:    ibuprofen (ADVIL) 200 MG tablet, Take by mouth. As needed., Disp: , Rfl:    Multiple Vitamin (MULTI-VITAMIN) tablet, Take 1 tablet by mouth daily., Disp: , Rfl:    Omega-3 Fatty Acids (OMEGA-3 FISH OIL PO), Take by mouth., Disp: , Rfl:    OVER THE COUNTER MEDICATION, IronRepair 20 mg daily, Disp: , Rfl:    TIROSINT 125 MCG CAPS, Take 125 mcg by mouth daily. , Disp: , Rfl:   Current Facility-Administered Medications:    Erenumab-aooe SOAJ 140 mg, 140 mg, Subcutaneous, Once, Anson Fret, MD  Allergies:  Allergies  Allergen Reactions   Paxil [Paroxetine Hcl] Other (See Comments)    syncope   Celecoxib Swelling    Swelling of ankles and feet for two weeks   Ezetimibe Other (See Comments)    Other Reaction(s): muscle pain   Propranolol Hcl Other (See Comments)    Other Reaction(s): Bloated   Atorvastatin Other (See Comments)    Other reaction(s): Myalgias (muscle pain)    Liothyronine Nausea And Vomiting    Other Reaction(s): upset stomach   Rosuvastatin Calcium Other (See Comments)    Muscle  pain   Statins Other (See Comments)    Muscle pain   Sulfamethoxazole Nausea And Vomiting   Zonisamide Other (See Comments)    Past Medical History, Surgical history, Social history, and Family History were reviewed and updated.  Review of Systems: As stated above in HPI   Physical Exam:  height is 5\' 2"  (1.575 m) and weight is 163 lb 1.3 oz (74 kg). Her oral temperature is 98 F (36.7 C). Her blood pressure is 176/82 (abnormal) and her pulse is 70. Her respiration is 18 and oxygen saturation is 98%.   Physical Exam General: NAD Cardiovascular: regular rate and  rhythm Pulmonary: clear ant fields Abdomen: soft, nontender, + bowel sounds GU: no suprapubic tenderness Extremities: no edema, no joint deformities Skin: no rashes Neurological: Weakness but otherwise nonfocal   Lab Results  Component Value Date   WBC 8.3 12/27/2023   HGB 15.1 (H) 12/27/2023   HCT 46.1 (H) 12/27/2023   MCV 90.2 12/27/2023   PLT 237 12/27/2023     Chemistry      Component Value Date/Time   NA 142 09/02/2023 1423   K 4.0 09/02/2023 1423   CL 106 09/02/2023 1423   CO2 28 09/02/2023 1423   BUN 16 09/02/2023 1423   CREATININE 0.58 09/02/2023 1423      Component Value Date/Time   CALCIUM 9.1 09/02/2023 1423   ALKPHOS 78 09/02/2023 1423   AST 17 09/02/2023 1423   ALT 10 09/02/2023 1423   BILITOT 0.6 09/02/2023 1423     Encounter Diagnoses  Name Primary?   Iron deficiency Yes   White coat syndrome without hypertension    Nutritional deficiency     Assessment and Plan- Patient is a 83 y.o. female with IDA thought to be secondary to malabsorption. She has had a recent endoscopy and colonoscopy. She has not yet had a capsule study.   Hgb stable today.  Symptomatically improved with IV iron.  Iron studies pending. Will replace if needed She has white coat hypertension. Home readings are around 120/80  Disposition: RTC 2 months APP, labs (CBC, iron, ferritin, retic, B12, folate)-[place]    Clent Jacks PA-C 2/24/202512:10 PM

## 2024-01-13 ENCOUNTER — Other Ambulatory Visit: Payer: Self-pay | Admitting: Obstetrics & Gynecology

## 2024-01-13 DIAGNOSIS — R947 Abnormal results of other endocrine function studies: Secondary | ICD-10-CM

## 2024-01-13 DIAGNOSIS — Z1231 Encounter for screening mammogram for malignant neoplasm of breast: Secondary | ICD-10-CM

## 2024-01-21 ENCOUNTER — Ambulatory Visit
Admission: RE | Admit: 2024-01-21 | Discharge: 2024-01-21 | Disposition: A | Discharge status: Home / Self Care | Source: Ambulatory Visit | Attending: Obstetrics & Gynecology | Admitting: Obstetrics & Gynecology

## 2024-01-21 DIAGNOSIS — Z1231 Encounter for screening mammogram for malignant neoplasm of breast: Secondary | ICD-10-CM

## 2024-02-07 ENCOUNTER — Ambulatory Visit
Admission: RE | Admit: 2024-02-07 | Discharge: 2024-02-07 | Disposition: A | Discharge status: Home / Self Care | Source: Ambulatory Visit | Attending: Obstetrics & Gynecology | Admitting: Obstetrics & Gynecology

## 2024-02-07 DIAGNOSIS — R947 Abnormal results of other endocrine function studies: Secondary | ICD-10-CM

## 2024-02-07 MED ORDER — IOPAMIDOL (ISOVUE-300) INJECTION 61%
100.0000 mL | Freq: Once | INTRAVENOUS | Status: AC | PRN
  Administered 2024-02-07: 100 mL via INTRAVENOUS

## 2024-02-24 ENCOUNTER — Inpatient Hospital Stay: Payer: Medicare PPO | Admitting: Medical Oncology

## 2024-02-24 ENCOUNTER — Encounter: Payer: Self-pay | Admitting: Medical Oncology

## 2024-02-24 ENCOUNTER — Inpatient Hospital Stay: Payer: Medicare PPO | Attending: Hematology and Oncology

## 2024-02-24 VITALS — BP 165/81 | HR 73 | Temp 97.5°F | Resp 18 | Wt 166.1 lb

## 2024-02-24 DIAGNOSIS — R030 Elevated blood-pressure reading, without diagnosis of hypertension: Secondary | ICD-10-CM | POA: Diagnosis not present

## 2024-02-24 DIAGNOSIS — E611 Iron deficiency: Secondary | ICD-10-CM

## 2024-02-24 DIAGNOSIS — Z85850 Personal history of malignant neoplasm of thyroid: Secondary | ICD-10-CM | POA: Insufficient documentation

## 2024-02-24 DIAGNOSIS — E639 Nutritional deficiency, unspecified: Secondary | ICD-10-CM

## 2024-02-24 DIAGNOSIS — D509 Iron deficiency anemia, unspecified: Secondary | ICD-10-CM | POA: Diagnosis present

## 2024-02-24 LAB — CBC
HCT: 42.4 % (ref 36.0–46.0)
Hemoglobin: 14.3 g/dL (ref 12.0–15.0)
MCH: 30 pg (ref 26.0–34.0)
MCHC: 33.7 g/dL (ref 30.0–36.0)
MCV: 89.1 fL (ref 80.0–100.0)
Platelets: 269 10*3/uL (ref 150–400)
RBC: 4.76 MIL/uL (ref 3.87–5.11)
RDW: 13.2 % (ref 11.5–15.5)
WBC: 6.9 10*3/uL (ref 4.0–10.5)
nRBC: 0 % (ref 0.0–0.2)

## 2024-02-24 LAB — VITAMIN B12: Vitamin B-12: 486 pg/mL (ref 180–914)

## 2024-02-24 LAB — RETIC PANEL
Immature Retic Fract: 9.6 % (ref 2.3–15.9)
RBC.: 4.81 MIL/uL (ref 3.87–5.11)
Retic Count, Absolute: 77 10*3/uL (ref 19.0–186.0)
Retic Ct Pct: 1.6 % (ref 0.4–3.1)
Reticulocyte Hemoglobin: 33.3 pg (ref >27.9)

## 2024-02-24 LAB — FERRITIN: Ferritin: 141 ng/mL (ref 11–307)

## 2024-02-24 LAB — FOLATE: Folate: 39.1 ng/mL (ref >5.9)

## 2024-02-24 LAB — IRON AND IRON BINDING CAPACITY (CC-WL,HP ONLY)
Iron: 109 ug/dL (ref 28–170)
Saturation Ratios: 37 % — ABNORMAL HIGH (ref 10.4–31.8)
TIBC: 291 ug/dL (ref 250–450)
UIBC: 182 ug/dL (ref 148–442)

## 2024-02-24 NOTE — Progress Notes (Signed)
 Hematology and Oncology Follow Up Visit  KORYNN KENEDY [id number] [birth year] 83 y.o. 02/24/2024  Past Medical History:  Diagnosis Date   Hashimoto's thyroiditis    Hypothyroidism    Migraines    Thyroid  cancer (HCC)     Principle Diagnosis:  IDA suspected to be secondary to malabsorption  Current Therapy:   IV Iron - Venofer  300 mg last dose 10/15/2023    Interim History:  Ms. Hipp is here for IDA follow up. Formerly seen at [facility] by Dr. Gudena. She is here with her husband.   She has a longstanding history of low ferritin levels. Most recent iron  history shown below: 02/06/23: Ferritin 6.2, TIBC 432 02/25/2023: Hemoglobin 12.5, MCV 85.8, TIBC 345, iron  saturation 15%, ferritin 15 06/14/2023: Hemoglobin 13.7, MCV 86.8, iron  saturation 20%, ferritin 32 09/02/2023: Hgb 14.1, iron  sat 21%, ferritin 18 12/27/2023: Hgb 15.1, iron  sat 49%, ferritin 104  Her most recent iron  infusion was IV Venofer  given on 10/08/2023 and 10/15/2023.    She is now off of her iron  supplement for the past few days due to constipation.   She reports that in September she had an endoscopy which showed a medium hiatal hernia. Colonoscopy of 2023 was normal with diverticula. I suggested a possible Capsule study at a previous visit. She has also recently had an Ifob test which was negative.   Today she states that she is feeling well. Overall fatigue and SOB is significantly better since getting IV iron . She has tolerated this well without side effects. There has been no bleeding to her knowledge: denies epistaxis, gingivitis, hemoptysis, hematemesis, hematuria, melena, excessive bruising, blood donation.  She has had a sleep study - [facility]) 2023-which did not show any significant apnea.   No SOB, chest pain or pica. No abdominal pains.   Wt Readings from Last 3 Encounters:  02/24/24 166 lb 1.9 oz (75.4 kg)  12/27/23 163 lb 1.3 oz (74 kg)  11/01/23 165 lb 0.8 oz (74.9 kg)      Medications:   Current Outpatient Medications:    Cholecalciferol (VITAMIN D3) 50 MCG (2000 UT) capsule, Take 2 capsules by mouth daily., Disp: , Rfl:    diphenhydramine-acetaminophen (TYLENOL PM) 25-500 MG TABS tablet, Take 1 tablet by mouth at bedtime as needed., Disp: , Rfl:    ibuprofen (ADVIL) 200 MG tablet, Take by mouth. As needed., Disp: , Rfl:    Multiple Vitamin (MULTI-VITAMIN) tablet, Take 1 tablet by mouth daily., Disp: , Rfl:    Omega-3 Fatty Acids (OMEGA-3 FISH OIL PO), Take by mouth., Disp: , Rfl:    OVER THE COUNTER MEDICATION, IronRepair 20 mg daily, Disp: , Rfl:    TIROSINT 125 MCG CAPS, Take 125 mcg by mouth daily. , Disp: , Rfl:    gabapentin (NEURONTIN) 100 MG capsule, Take 100-200 mg by mouth at bedtime. (Patient not taking: Reported on 02/24/2024), Disp: , Rfl:    progesterone  (PROMETRIUM ) 200 MG capsule, Take 200 mg by mouth daily., Disp: , Rfl:   Current Facility-Administered Medications:    Erenumab -aooe SOAJ 140 mg, 140 mg, Subcutaneous, Once, Glory Larsen, MD  Allergies:  Allergies  Allergen Reactions   Paxil [Paroxetine Hcl] Other (See Comments)    syncope   Celecoxib Swelling    Swelling of ankles and feet for two weeks   Ezetimibe Other (See Comments)    Other Reaction(s): muscle pain   Propranolol Hcl Other (See Comments)    Other Reaction(s): Bloated   Atorvastatin Other (  See Comments)    Other reaction(s): Myalgias (muscle pain)    Liothyronine Nausea And Vomiting    Other Reaction(s): upset stomach   Rosuvastatin Calcium Other (See Comments)    Muscle pain   Statins Other (See Comments)    Muscle pain   Sulfamethoxazole Nausea And Vomiting   Zonisamide Other (See Comments)    Past Medical History, Surgical history, Social history, and Family History were reviewed and updated.  Review of Systems: As stated above in HPI   Physical Exam:  weight is 166 lb 1.9 oz (75.4 kg). Her oral temperature is 97.5 F (36.4 C) (abnormal). Her  blood pressure is 165/81 (abnormal) and her pulse is 73. Her respiration is 18 and oxygen saturation is 98%.   Physical Exam General: NAD Cardiovascular: regular rate and rhythm Pulmonary: clear ant fields Abdomen: soft, nontender, + bowel sounds GU: no suprapubic tenderness Extremities: no edema, no joint deformities Skin: no rashes Neurological: Weakness but otherwise nonfocal   Lab Results  Component Value Date   WBC 6.9 02/24/2024   HGB 14.3 02/24/2024   HCT 42.4 02/24/2024   MCV 89.1 02/24/2024   PLT 269 02/24/2024     Chemistry      Component Value Date/Time   NA 142 09/02/2023 1423   K 4.0 09/02/2023 1423   CL 106 09/02/2023 1423   CO2 28 09/02/2023 1423   BUN 16 09/02/2023 1423   CREATININE 0.58 09/02/2023 1423      Component Value Date/Time   CALCIUM 9.1 09/02/2023 1423   ALKPHOS 78 09/02/2023 1423   AST 17 09/02/2023 1423   ALT 10 09/02/2023 1423   BILITOT 0.6 09/02/2023 1423     Encounter Diagnoses  Name Primary?   Iron  deficiency Yes   Nutritional deficiency    White coat syndrome without hypertension    Assessment and Plan- Patient is a 83 y.o. female with IDA thought to be secondary to malabsorption. She has had a recent endoscopy and colonoscopy. She has not yet had a capsule study.   Hgb today is 15.1. she will continue to hold the iron  supplement  Iron  studies pending.  She has white coat hypertension. Home readings are around 120/80  Disposition: RTC 3 months APP, labs (CBC, iron , ferritin, retic)-[place]    Sunnie England PA-C 4/24/202512:19 PM

## 2024-02-28 ENCOUNTER — Encounter: Payer: Self-pay | Admitting: Medical Oncology

## 2024-03-06 NOTE — Progress Notes (Addendum)
 RIGHT Knee Joint Hyaluronic Acid Injection with ultrasound guidance  LOT Number: [id number] Expiration Date: 01-31-2027  Consent - Knee joint viscosupplementation  After discussing the various treatment options for the condition, it was agreed that hyaluronic acid injection would be the next step in treatment. The nature of and the indications for injection of hyaluronic acid and local anesthetic were reviewed in detail with the patient today. The inherent risks of injection, including infection, bleeding, allergic reaction, increased pain, incomplete relief or temporary relief of symptoms, tendon or ligament injury, articular cartilage degeneration, and/or nerve injury were discussed. The patient expressed understanding and consented to proceed.   Procedure  After the risks and benefits of the procedure were explained, verbal consent was given, and a procedural time-out was performed. The knee joint was palpated and the correct anatomical landmarks were used to identify the knee joint. The knee joint and surrounding structures were then visualized with ultrasound. The site for the injection was properly marked and prepped with chlorhexidine solution. The injection site was anesthetized with ethyl chloride and 5 milliliters of 1% lidocaine with a 22 gauge 1.5 inch needle. Using ultrasound guidance, the knee joint was visualized and injected with Supartz using a sterile technique and a 22 gauge 1.5 inch needle. During the injection, there was unrestricted flow and care was taken not to inject into the skin or subcutaneous tissues.   Post-procedure  A sterile adhesive bandage was applied. Instructions were given regarding post-injection care, when to follow up in clinic and what to expect from the procedures. The patient tolerated the procedures well and was discharged without immediate complication.   NEED FOR SONOGRAPHIC GUIDANCE  Given the complexity of this problem and the anatomic location of this  structure, sonographic guidance is recommended to prevent injury to neurovascular structures and confirm accuracy of injection. The accuracy of doing these injections blind is poor and the benefit to the patient by using ultrasound guidance is significant to avoid complications.   Reference:  American Medical Society for Sports Medicine (AMSSM) position statement: interventional musculoskeletal ultrasound in sports medicine.  Finnoff JT, Hall MM, Adams FORBES Proper D, Concoff AL, Dexter W, Smith J.  Br J Sports Med. 2014 Oct 20. pii: [id number]. doi: 10.1136/[id number]   LEFT Knee Joint Hyaluronic Acid Injection with ultrasound guidance  LOT Number: [id number] Expiration Date: 01-31-2027  Consent - Knee joint viscosupplementation  After discussing the various treatment options for the condition, it was agreed that hyaluronic acid injection would be the next step in treatment. The nature of and the indications for injection of hyaluronic acid and local anesthetic were reviewed in detail with the patient today. The inherent risks of injection, including infection, bleeding, allergic reaction, increased pain, incomplete relief or temporary relief of symptoms, tendon or ligament injury, articular cartilage degeneration, and/or nerve injury were discussed. The patient expressed understanding and consented to proceed.   Procedure  After the risks and benefits of the procedure were explained, verbal consent was given, and a procedural time-out was performed. The knee joint was palpated and the correct anatomical landmarks were used to identify the knee joint. The knee joint and surrounding structures were then visualized with ultrasound. The site for the injection was properly marked and prepped with chlorhexidine solution. The injection site was anesthetized with ethyl chloride and 5 milliliters of 1% lidocaine with a 22 gauge 1.5 inch needle. Using ultrasound guidance, the knee joint was  visualized and injected with Supartz using a sterile technique and a 22 gauge 1.5  inch needle. During the injection, there was unrestricted flow and care was taken not to inject into the skin or subcutaneous tissues.   Post-procedure  A sterile adhesive bandage was applied. Instructions were given regarding post-injection care, when to follow up in clinic and what to expect from the procedures. The patient tolerated the procedures well and was discharged without immediate complication.   NEED FOR SONOGRAPHIC GUIDANCE  Given the complexity of this problem and the anatomic location of this structure, sonographic guidance is recommended to prevent injury to neurovascular structures and confirm accuracy of injection. The accuracy of doing these injections blind is poor and the benefit to the patient by using ultrasound guidance is significant to avoid complications.   Reference:  American Medical Society for Sports Medicine (AMSSM) position statement: interventional musculoskeletal ultrasound in sports medicine.  Garnetta ALEC Hurst MM, Adams E, Berkoff D, Concoff AL, Gladstone LELON Claudene DOROTHA Mallissa J Sports Med. 2014 Oct 20. pii: [id number]. doi: 10.1136/[email or web address]    Subjective 83 y.o. female who returns to clinic today with bilateral knee pain. She is here today for bilateral Supartz injections #3.   Objective:  Vitals:   03/06/24 1533  BP: (!) 154/92  Pulse:   Resp:   SpO2:    Bilateral knees: -- Inspection: Bilateral knee valgus -- Palpation: Tender over medial joint line -- ROM: 0 - 120 degrees bilaterally  Imaging: Bilateral Knee X-Rays (12-14-2023): No fractures or dislocation. Lateral > medial femorotibial joint space narrowing with osteophytosis. Patellofemoral compartments are preserved.   Assessment [name] is a 83 y.o. female with bilateral knee osteoarthritis, predominantly in the lateral > medial femorotibial compartment. She has unfortunately failed  conservative management including bilateral knee cortisone injections, oral medications, topical medications, and physical therapy exercises. She is here today for bilateral knee Supartz injections. She would like to delay knee replacement surgery for as long as possible.   Plan Bilateral Knee Supartz Injections #3 performed today in clinic with ultrasound guidance Continue all medications as prescribed Follow up with Dr. Tobie in 6 weeks  Laverda GEANNIE Tobie, DO CA[facility] - [facility]

## 2024-04-13 ENCOUNTER — Telehealth: Payer: Self-pay | Admitting: Adult Health

## 2024-04-13 NOTE — Telephone Encounter (Signed)
 Patient cancelled appointment due to found out what the problem was and seeing an oncology in [place].

## 2024-05-02 ENCOUNTER — Ambulatory Visit: Admitting: Neurology

## 2024-05-06 ENCOUNTER — Observation Stay (HOSPITAL_BASED_OUTPATIENT_CLINIC_OR_DEPARTMENT_OTHER)
Admission: EM | Admit: 2024-05-06 | Discharge: 2024-05-08 | Disposition: A | Discharge status: Home / Self Care | Attending: Internal Medicine | Admitting: Internal Medicine

## 2024-05-06 ENCOUNTER — Other Ambulatory Visit: Payer: Self-pay

## 2024-05-06 ENCOUNTER — Emergency Department (HOSPITAL_BASED_OUTPATIENT_CLINIC_OR_DEPARTMENT_OTHER)

## 2024-05-06 DIAGNOSIS — E890 Postprocedural hypothyroidism: Secondary | ICD-10-CM | POA: Diagnosis not present

## 2024-05-06 DIAGNOSIS — H547 Unspecified visual loss: Secondary | ICD-10-CM | POA: Diagnosis present

## 2024-05-06 DIAGNOSIS — G4733 Obstructive sleep apnea (adult) (pediatric): Secondary | ICD-10-CM | POA: Diagnosis not present

## 2024-05-06 DIAGNOSIS — G2581 Restless legs syndrome: Secondary | ICD-10-CM | POA: Diagnosis not present

## 2024-05-06 DIAGNOSIS — E039 Hypothyroidism, unspecified: Secondary | ICD-10-CM | POA: Insufficient documentation

## 2024-05-06 DIAGNOSIS — Z85850 Personal history of malignant neoplasm of thyroid: Secondary | ICD-10-CM | POA: Insufficient documentation

## 2024-05-06 DIAGNOSIS — G43711 Chronic migraine without aura, intractable, with status migrainosus: Secondary | ICD-10-CM | POA: Diagnosis present

## 2024-05-06 DIAGNOSIS — G459 Transient cerebral ischemic attack, unspecified: Principal | ICD-10-CM | POA: Insufficient documentation

## 2024-05-06 DIAGNOSIS — Z6830 Body mass index (BMI) 30.0-30.9, adult: Secondary | ICD-10-CM | POA: Diagnosis not present

## 2024-05-06 DIAGNOSIS — I6381 Other cerebral infarction due to occlusion or stenosis of small artery: Principal | ICD-10-CM | POA: Insufficient documentation

## 2024-05-06 DIAGNOSIS — H5461 Unqualified visual loss, right eye, normal vision left eye: Secondary | ICD-10-CM | POA: Diagnosis present

## 2024-05-06 DIAGNOSIS — Z7982 Long term (current) use of aspirin: Secondary | ICD-10-CM | POA: Insufficient documentation

## 2024-05-06 DIAGNOSIS — E66811 Obesity, class 1: Secondary | ICD-10-CM | POA: Insufficient documentation

## 2024-05-06 DIAGNOSIS — C73 Malignant neoplasm of thyroid gland: Secondary | ICD-10-CM | POA: Diagnosis not present

## 2024-05-06 DIAGNOSIS — G4761 Periodic limb movement disorder: Secondary | ICD-10-CM | POA: Insufficient documentation

## 2024-05-06 DIAGNOSIS — Z8673 Personal history of transient ischemic attack (TIA), and cerebral infarction without residual deficits: Secondary | ICD-10-CM | POA: Insufficient documentation

## 2024-05-06 DIAGNOSIS — I10 Essential (primary) hypertension: Secondary | ICD-10-CM | POA: Diagnosis not present

## 2024-05-06 DIAGNOSIS — W57XXXA Bitten or stung by nonvenomous insect and other nonvenomous arthropods, initial encounter: Secondary | ICD-10-CM | POA: Insufficient documentation

## 2024-05-06 DIAGNOSIS — G453 Amaurosis fugax: Principal | ICD-10-CM

## 2024-05-06 DIAGNOSIS — L988 Other specified disorders of the skin and subcutaneous tissue: Secondary | ICD-10-CM | POA: Insufficient documentation

## 2024-05-06 LAB — BASIC METABOLIC PANEL WITH GFR
Anion gap: 12 (ref 5–15)
BUN: 31 mg/dL — ABNORMAL HIGH (ref 8–23)
CO2: 23 mmol/L (ref 22–32)
Calcium: 8.8 mg/dL — ABNORMAL LOW (ref 8.9–10.3)
Chloride: 105 mmol/L (ref 98–111)
Creatinine, Ser: 0.95 mg/dL (ref 0.44–1.00)
GFR, Estimated: 60 mL/min — ABNORMAL LOW (ref >60)
Glucose, Bld: 115 mg/dL — ABNORMAL HIGH (ref 70–99)
Potassium: 4.1 mmol/L (ref 3.5–5.1)
Sodium: 140 mmol/L (ref 135–145)

## 2024-05-06 LAB — CBC
HCT: 44 % (ref 36.0–46.0)
Hemoglobin: 14.7 g/dL (ref 12.0–15.0)
MCH: 29.6 pg (ref 26.0–34.0)
MCHC: 33.4 g/dL (ref 30.0–36.0)
MCV: 88.5 fL (ref 80.0–100.0)
Platelets: 304 K/uL (ref 150–400)
RBC: 4.97 MIL/uL (ref 3.87–5.11)
RDW: 12.8 % (ref 11.5–15.5)
WBC: 11.3 K/uL — ABNORMAL HIGH (ref 4.0–10.5)
nRBC: 0 % (ref 0.0–0.2)

## 2024-05-06 LAB — SEDIMENTATION RATE: Sed Rate: 6 mm/h (ref 0–22)

## 2024-05-06 MED ORDER — ASPIRIN 81 MG PO CHEW
81.0000 mg | CHEWABLE_TABLET | Freq: Once | ORAL | Status: AC
  Administered 2024-05-06: 81 mg via ORAL
  Filled 2024-05-06: qty 1

## 2024-05-06 MED ORDER — IOHEXOL 350 MG/ML SOLN
75.0000 mL | Freq: Once | INTRAVENOUS | Status: AC | PRN
  Administered 2024-05-06: 75 mL via INTRAVENOUS

## 2024-05-06 NOTE — Plan of Care (Signed)
 Plan of Care Note for accepted transfer   Patient name: [name] FMW:[id number] DOB: 1941-06-29  Facility requesting transfer: [facility] Requesting Provider: Dr. Bari Facility course: 83 year old female with history of Hashimoto's thyroiditis, hypothyroidism, thyroid  cancer, migraines, iron  deficiency anemia, previous CVA presented with complaint of sudden onset complete vision loss of her right eye which resolved within a few minutes.  This occurred around 4 PM today.  History worrisome for amaurosis fugax.  ESR normal on labs.    IMPRESSION: CT HEAD:   1. No acute intracranial abnormality. 2. Age-related cerebral atrophy with moderate chronic microvascular ischemic disease, with remote lacunar infarcts about the left basal ganglia.   CTA HEAD AND NECK:   1. Negative CTA for large vessel occlusion or other emergent finding. 2. Moderate right P2 stenosis. 3. 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 4. Additional 3-4 mm outpouching extending inferiorly from the supraclinoid right ICA, which could reflect a vascular infundibulum or possibly additional aneurysm. Attention at follow-up recommended. 5. Extensive aortic atherosclerosis with soft plaque protruding into the aortic lumen of the distal arch. 6. Diffuse tortuosity and ectasia of the major arterial vasculature of the head and neck, suggesting chronic underlying hypertension.   Aortic Atherosclerosis (ICD10-I70.0).  EDP discussed the case with neurologist Dr. Vanessa who recommended admission for TIA workup.  Patient was given aspirin .    Plan of care: The patient is accepted for admission to Telemetry unit at [facility].  [facility] will assume care on arrival to accepting facility. Until arrival, care as per EDP. However, [facility] available 24/7 for questions and assistance.  Check [email or web address] for on-call coverage.  Nursing staff, please call [facility] Admits & Consults System-Wide number under Amion on patient's  arrival so appropriate admitting provider can evaluate the pt.

## 2024-05-06 NOTE — ED Triage Notes (Signed)
 Today at 4 pm ,watching TV with husband. Right eye completely lost vision suddenly,resolved in a few minutes.   Pmh, hypothyroid, thyroid  cancer, silent stroke ,migraines, hypertension, iron  def anemia,

## 2024-05-06 NOTE — ED Triage Notes (Signed)
 Right upper quadrant eye ,visual field ,can't see .

## 2024-05-06 NOTE — ED Provider Notes (Signed)
 [place] EMERGENCY DEPARTMENT AT [facility] Provider Note   CSN: [id number] Arrival date & time: 05/06/24  8147     Patient presents with: No chief complaint on file.   [name] is a 83 y.o. female.   HPI Patient presents with vision deficit.  Reportedly had sudden loss of vision in right eye.  Resolves in a few minutes.  States now back at baseline.  States early on days she had taken medicine for migraine thinking she may get 1 but never did get the migraine.  Has not had episodes like this before.  Patient's husband was with her and had no other neurodeficits at the time.   Past Medical History:  Diagnosis Date   Hashimoto's thyroiditis    Hypothyroidism    Migraines    Thyroid  cancer [facility])    Past Surgical History:  Procedure Laterality Date   APPENDECTOMY     BREAST CYST ASPIRATION Right 2014   BREAST EXCISIONAL BIOPSY Left 2008   benign   cataract surgery Bilateral 2020   CESAREAN SECTION     CHOLECYSTECTOMY     FOOT SURGERY     THYROIDECTOMY     uterine fibroid removal       Prior to Admission medications   Medication Sig Start Date End Date Taking? Authorizing Provider  Cholecalciferol (VITAMIN D3) 50 MCG (2000 UT) capsule Take 2 capsules by mouth daily.    [provider]  diphenhydramine-acetaminophen  (TYLENOL  PM) 25-500 MG TABS tablet Take 1 tablet by mouth at bedtime as needed.    [provider]  ibuprofen (ADVIL) 200 MG tablet Take by mouth. As needed.    [provider]  Multiple Vitamin (MULTI-VITAMIN) tablet Take 1 tablet by mouth daily.    [provider]  Omega-3 Fatty Acids (OMEGA-3 FISH OIL PO) Take by mouth.    [provider]  OVER THE COUNTER MEDICATION IronRepair 20 mg daily    [provider]  progesterone  (PROMETRIUM ) 200 MG capsule Take 200 mg by mouth daily.    [provider]  TIROSINT 125 MCG CAPS Take 125 mcg by mouth daily.  05/31/19   [provider]     Allergies: Paxil [paroxetine hcl], Celecoxib, Ezetimibe, Propranolol hcl, Atorvastatin, Liothyronine, Rosuvastatin calcium, Statins, Sulfamethoxazole, and Zonisamide    Review of Systems  Updated Vital Signs BP (!) 194/98   Pulse 94   Temp 98 F (36.7 C)   Resp (!) 25   SpO2 97%   Physical Exam Vitals and nursing note reviewed.  Cardiovascular:     Rate and Rhythm: Normal rate and regular rhythm.  Pulmonary:     Breath sounds: No wheezing.  Abdominal:     Tenderness: There is no abdominal tenderness.  Neurological:     Mental Status: She is alert.     Comments: Moving all extremities awake and appropriate.  Visual fields grossly intact to confrontation.  Eye movements intact.     (all labs ordered are listed, but only abnormal results are displayed) Labs Reviewed  CBC - Abnormal; Notable for the following components:      Result Value   WBC 11.3 (*)    All other components within normal limits  BASIC METABOLIC PANEL WITH GFR - Abnormal; Notable for the following components:   Glucose, Bld 115 (*)    BUN 31 (*)    Calcium 8.8 (*)    GFR, Estimated 60 (*)    All other components within normal limits  SEDIMENTATION RATE    EKG: None  Radiology: CT ANGIO HEAD NECK W WO CM Result Date: 05/06/2024 CLINICAL DATA:  Initial evaluation for amaurosis fugax. Laterality not specified. EXAM: CT ANGIOGRAPHY HEAD AND NECK WITH AND WITHOUT CONTRAST TECHNIQUE: Multidetector CT imaging of the head and neck was performed using the standard protocol during bolus administration of intravenous contrast. Multiplanar CT image reconstructions and MIPs were obtained to evaluate the vascular anatomy. Carotid stenosis measurements (when applicable) are obtained utilizing NASCET criteria, using the distal internal carotid diameter as the denominator. RADIATION DOSE REDUCTION: This exam was performed according to the departmental dose-optimization program which includes automated exposure  control, adjustment of the mA and/or kV according to patient size and/or use of iterative reconstruction technique. CONTRAST:  75mL OMNIPAQUE  IOHEXOL  350 MG/ML SOLN COMPARISON:  Prior study from 01/18/2017 FINDINGS: CT HEAD FINDINGS Brain: Age-related cerebral atrophy with moderate chronic microvascular ischemic disease. Remote lacunar infarcts present about the left basal ganglia. No acute intracranial hemorrhage. No acute large vessel territory infarct. No mass lesion or midline shift. No hydrocephalus or extra-axial fluid collection. Vascular: No asymmetric hyperdense vessel. Calcified atherosclerosis present at the skull base. Skull: No acute finding. Sinuses/Orbits: Globes orbital soft tissues within normal limits. Paranasal sinuses and mastoid air cells are largely clear. Other: None. Review of the MIP images confirms the above findings CTA NECK FINDINGS Aortic arch: Visualized aortic arch within normal limits for caliber. Extensive aortic atherosclerosis with soft plaque protruding into the aortic lumen of the distal arch (series 10, image 270). No visible high-grade stenosis about the origin the great vessels. Right carotid system: Right common and internal carotid arteries are tortuous and ectatic but patent without stenosis or dissection. Left carotid system: Left common and internal carotid arteries are tortuous and ectatic but patent without stenosis or dissection. Vertebral arteries: Left vertebral artery dominant. Vertebral arteries are tortuous but patent without stenosis or dissection. Skeleton: No worrisome osseous lesions. Exaggeration of the normal cervical lordosis with mild multilevel cervical spondylosis. Other neck: No other acute finding.  Prior thyroidectomy. Upper chest: No other acute finding. Review of the MIP images confirms the above findings CTA HEAD FINDINGS Anterior circulation: Atheromatous change about the carotid siphons without hemodynamically significant stenosis. 2-3 mm left  paraophthalmic aneurysm (series 10, image 95). Additional 3-4 mm outpouching extending inferiorly from the supraclinoid right ICA could reflect a vascular infundibulum or possibly additional aneurysm (series 10, image 98). A1 segments patent bilaterally. Normal anterior communicating artery complex. Anterior cerebral arteries patent without significant stenosis. No M1 stenosis or occlusion. Distal MCA branches perfused and symmetric. Posterior circulation: Left vertebral artery strongly dominant and patent without stenosis. Left PICA patent. Right V4 segment patent as well. Right PICA patent at its origin. Mild atheromatous irregularity about the basilar without significant stenosis. Superior cerebral arteries patent bilaterally. Right PCA supplied via the basilar. Fetal type origin of the left PCA. Right PCA somewhat tortuous and irregular with superimposed moderate right P2 stenosis (series 13, image 21). PCAs remain patent to their distal aspects. Venous sinuses: Grossly patent allowing for timing the contrast bolus. Anatomic variants: As above. Review of the MIP images confirms the above findings IMPRESSION: CT HEAD: 1. No acute intracranial abnormality. 2. Age-related cerebral atrophy with moderate chronic microvascular ischemic disease, with remote lacunar infarcts about the left basal ganglia. CTA HEAD AND NECK: 1. Negative CTA for large vessel occlusion or other emergent finding. 2. Moderate right P2 stenosis. 3. 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 4. Additional 3-4 mm outpouching  extending inferiorly from the supraclinoid right ICA, which could reflect a vascular infundibulum or possibly additional aneurysm. Attention at follow-up recommended. 5. Extensive aortic atherosclerosis with soft plaque protruding into the aortic lumen of the distal arch. 6. Diffuse tortuosity and ectasia of the major arterial vasculature of the head and neck, suggesting chronic underlying hypertension. Aortic Atherosclerosis  (ICD10-I70.0). Electronically Signed   By: Morene Hoard M.D.   On: 05/06/2024 21:24     Procedures   Medications Ordered in the ED  aspirin  chewable tablet 81 mg (has no administration in time range)  iohexol  (OMNIPAQUE ) 350 MG/ML injection 75 mL (75 mLs Intravenous Contrast Given 05/06/24 2053)                                    Medical Decision Making Amount and/or Complexity of Data Reviewed Labs: ordered. Radiology: ordered.  Risk OTC drugs. Prescription drug management. Decision regarding hospitalization.   Patient with sudden vision loss in right eye that is since resolved.  Worrisome for amaurosis fugax.  Primary issue felt less likely with quick resolution of symptoms.  No neurodeficits on my exam.  Discussed with Sal from neurology.  After workup will require admission for further TIA workup.  CTA done and overall reassuring.  Does have old stroke.  Without deficits not a TNK candidate.  Will discuss with hospitalist for admission     Final diagnoses:  Amaurosis fugax of right eye    ED Discharge Orders     None          Patsey Lot, MD 05/06/24 2224

## 2024-05-06 NOTE — ED Triage Notes (Signed)
 Estrogen levels are high.

## 2024-05-07 ENCOUNTER — Observation Stay (HOSPITAL_COMMUNITY)

## 2024-05-07 ENCOUNTER — Encounter (HOSPITAL_COMMUNITY): Payer: Self-pay | Admitting: Internal Medicine

## 2024-05-07 ENCOUNTER — Observation Stay (HOSPITAL_BASED_OUTPATIENT_CLINIC_OR_DEPARTMENT_OTHER)

## 2024-05-07 DIAGNOSIS — G459 Transient cerebral ischemic attack, unspecified: Secondary | ICD-10-CM

## 2024-05-07 DIAGNOSIS — W57XXXA Bitten or stung by nonvenomous insect and other nonvenomous arthropods, initial encounter: Secondary | ICD-10-CM | POA: Diagnosis not present

## 2024-05-07 DIAGNOSIS — G43711 Chronic migraine without aura, intractable, with status migrainosus: Secondary | ICD-10-CM | POA: Diagnosis not present

## 2024-05-07 DIAGNOSIS — G2581 Restless legs syndrome: Secondary | ICD-10-CM | POA: Diagnosis not present

## 2024-05-07 DIAGNOSIS — I10 Essential (primary) hypertension: Secondary | ICD-10-CM | POA: Diagnosis not present

## 2024-05-07 DIAGNOSIS — G4761 Periodic limb movement disorder: Secondary | ICD-10-CM | POA: Diagnosis not present

## 2024-05-07 DIAGNOSIS — Z7982 Long term (current) use of aspirin: Secondary | ICD-10-CM | POA: Diagnosis not present

## 2024-05-07 DIAGNOSIS — L988 Other specified disorders of the skin and subcutaneous tissue: Secondary | ICD-10-CM | POA: Diagnosis not present

## 2024-05-07 DIAGNOSIS — E039 Hypothyroidism, unspecified: Secondary | ICD-10-CM | POA: Diagnosis not present

## 2024-05-07 DIAGNOSIS — G453 Amaurosis fugax: Secondary | ICD-10-CM

## 2024-05-07 DIAGNOSIS — Z6830 Body mass index (BMI) 30.0-30.9, adult: Secondary | ICD-10-CM | POA: Diagnosis not present

## 2024-05-07 DIAGNOSIS — G4733 Obstructive sleep apnea (adult) (pediatric): Secondary | ICD-10-CM | POA: Diagnosis not present

## 2024-05-07 DIAGNOSIS — H547 Unspecified visual loss: Secondary | ICD-10-CM | POA: Diagnosis present

## 2024-05-07 DIAGNOSIS — C73 Malignant neoplasm of thyroid gland: Secondary | ICD-10-CM | POA: Diagnosis not present

## 2024-05-07 DIAGNOSIS — I6381 Other cerebral infarction due to occlusion or stenosis of small artery: Secondary | ICD-10-CM | POA: Diagnosis not present

## 2024-05-07 DIAGNOSIS — E890 Postprocedural hypothyroidism: Secondary | ICD-10-CM

## 2024-05-07 DIAGNOSIS — E66811 Obesity, class 1: Secondary | ICD-10-CM | POA: Diagnosis not present

## 2024-05-07 LAB — CBC
HCT: 45.9 % (ref 36.0–46.0)
Hemoglobin: 15.5 g/dL — ABNORMAL HIGH (ref 12.0–15.0)
MCH: 30.2 pg (ref 26.0–34.0)
MCHC: 33.8 g/dL (ref 30.0–36.0)
MCV: 89.3 fL (ref 80.0–100.0)
Platelets: 322 K/uL (ref 150–400)
RBC: 5.14 MIL/uL — ABNORMAL HIGH (ref 3.87–5.11)
RDW: 12.7 % (ref 11.5–15.5)
WBC: 11.2 K/uL — ABNORMAL HIGH (ref 4.0–10.5)
nRBC: 0 % (ref 0.0–0.2)

## 2024-05-07 LAB — COMPREHENSIVE METABOLIC PANEL WITH GFR
ALT: 20 U/L (ref 0–44)
AST: 19 U/L (ref 15–41)
Albumin: 3.6 g/dL (ref 3.5–5.0)
Alkaline Phosphatase: 78 U/L (ref 38–126)
Anion gap: 9 (ref 5–15)
BUN: 16 mg/dL (ref 8–23)
CO2: 26 mmol/L (ref 22–32)
Calcium: 9 mg/dL (ref 8.9–10.3)
Chloride: 104 mmol/L (ref 98–111)
Creatinine, Ser: 0.61 mg/dL (ref 0.44–1.00)
GFR, Estimated: >60 mL/min (ref >60)
Glucose, Bld: 133 mg/dL — ABNORMAL HIGH (ref 70–99)
Potassium: 3.6 mmol/L (ref 3.5–5.1)
Sodium: 139 mmol/L (ref 135–145)
Total Bilirubin: 1 mg/dL (ref 0.0–1.2)
Total Protein: 6.8 g/dL (ref 6.5–8.1)

## 2024-05-07 LAB — ECHOCARDIOGRAM COMPLETE
Area-P 1/2: 3.77 cm2
Height: 62 in
S' Lateral: 3 cm
Weight: 2659.63 [oz_av]

## 2024-05-07 LAB — HEMOGLOBIN A1C
Hgb A1c MFr Bld: 5.5 % (ref 4.8–5.6)
Mean Plasma Glucose: 111.15 mg/dL

## 2024-05-07 MED ORDER — STROKE: EARLY STAGES OF RECOVERY BOOK
Freq: Once | Status: AC
  Filled 2024-05-07: qty 1

## 2024-05-07 MED ORDER — ACETAMINOPHEN 325 MG PO TABS
650.0000 mg | ORAL_TABLET | ORAL | Status: DC | PRN
Start: 2024-05-07 — End: 2024-05-08

## 2024-05-07 MED ORDER — ACETAMINOPHEN 650 MG RE SUPP: 650.0000 mg | RECTAL | Status: DC | PRN

## 2024-05-07 MED ORDER — ASPIRIN 81 MG PO TBEC
81.0000 mg | DELAYED_RELEASE_TABLET | Freq: Every day | ORAL | Status: DC
  Administered 2024-05-08: 81 mg via ORAL
  Filled 2024-05-07: qty 1

## 2024-05-07 MED ORDER — ACETAMINOPHEN 160 MG/5ML PO SOLN: 650.0000 mg | ORAL | Status: DC | PRN

## 2024-05-07 MED ORDER — ASPIRIN 81 MG PO CHEW
81.0000 mg | CHEWABLE_TABLET | Freq: Once | ORAL | Status: AC
  Administered 2024-05-07: 81 mg via ORAL
  Filled 2024-05-07: qty 1

## 2024-05-07 MED ORDER — ENOXAPARIN SODIUM 40 MG/0.4ML IJ SOSY
40.0000 mg | PREFILLED_SYRINGE | INTRAMUSCULAR | Status: DC
  Administered 2024-05-07: 40 mg via SUBCUTANEOUS
  Filled 2024-05-07: qty 0.4

## 2024-05-07 MED ORDER — SENNOSIDES-DOCUSATE SODIUM 8.6-50 MG PO TABS: 1.0000 | ORAL_TABLET | Freq: Every evening | ORAL | Status: DC | PRN

## 2024-05-07 NOTE — ED Notes (Signed)
 Kim with cl called for transport

## 2024-05-07 NOTE — TOC Initial Note (Signed)
 Transition of Care [facility]) - Initial/Assessment Note    Patient Details  Name: [name] MRN: [id number] Date of Birth: 03/24/1941  [facility] CM/SW Contact:    Marval Gell, RN Phone Number: 05/07/2024, 1:41 PM  Clinical Narrative:                  Patient from home w spouse, admitted with temporary vision loss, TIA work up.  Therapies pending, no HH Hx seen in [facility] portal. Will follow for TOC needs  Expected Discharge Plan: Home/Self Care Barriers to Discharge: Continued Medical Work up   Patient Goals and CMS Choice            Expected Discharge Plan and Services   Discharge Planning Services: CM Consult   Living arrangements for the past 2 months: Single Family Home                                      Prior Living Arrangements/Services Living arrangements for the past 2 months: Single Family Home Lives with:: Spouse                   Activities of Daily Living   ADL Screening (condition at time of admission) Independently performs ADLs?: Yes (appropriate for developmental age) Is the patient deaf or have difficulty hearing?: No Does the patient have difficulty seeing, even when wearing glasses/contacts?: No Does the patient have difficulty concentrating, remembering, or making decisions?: No  Permission Sought/Granted                  Emotional Assessment              Admission diagnosis:  TIA (transient ischemic attack) [G45.9] Amaurosis fugax of right eye [G45.3] Patient Active Problem List   Diagnosis Date Noted   TIA (transient ischemic attack) 05/06/2024   PLMD (periodic limb movement disorder) 06/23/2022   Neoplastic malignant related fatigue 05/13/2022   Chronic right-sided headaches 05/13/2022   RLS (restless legs syndrome) 05/13/2022   OSA on CPAP 09/03/2021   Moderate obstructive sleep apnea-hypopnea syndrome 12/20/2020   Primary snoring 11/12/2020   History of restless legs syndrome 11/12/2020    History of iron  deficiency anemia 11/12/2020   Chronic migraine without aura, with intractable migraine, so stated, with status migrainosus 07/12/2019   Menopause present 03/31/2017   Benign essential hypertension 01/20/2016   Papillary adenocarcinoma, follicular variant (HCC) 01/20/2016   Postoperative hypothyroidism 01/20/2016   PCP:  [name], MD Pharmacy:   [facility] [phone] - [place], [place] - [address] [place] [address] Phone: [phone] Fax: [phone]  [facility] [address] [place] [address] Phone: [phone] Fax: [phone]     Social Drivers of Health (SDOH) Social History: SDOH Screenings   Food Insecurity: No Food Insecurity (05/07/2024)  Housing: Low Risk  (05/07/2024)  Transportation Needs: No Transportation Needs (05/07/2024)  Utilities: Not At Risk (05/07/2024)  Financial Resource Strain: Low Risk  (04/10/2024)   Received from [facility]  Social Connections: Moderately Integrated (05/07/2024)  Tobacco Use: Low Risk  (05/06/2024)   Received from [facility]   SDOH Interventions:     Readmission Risk Interventions     No data to display

## 2024-05-07 NOTE — Consult Note (Signed)
 NEUROLOGY CONSULT NOTE   Date of service: May 07, 2024 Patient Name: [name] MRN:  [id number] DOB:  [birth year] Chief Complaint: vision loss Requesting Provider: Seena Marsa NOVAK, MD  History of Present Illness  [name] is a 83 y.o. female with hx of migraines, previous CVA, anemia, thyroid  cancer, Hashimoto's thyroiditis, hypothyroidism who presented to med [facility] ED due to sudden vision loss in right eye, since resolved.  CT head with no acute findings, remote left basal ganglia lacunar infarcts.  CTA of the head and neck showed no evidence of LVO or other emergent finding. Did demonstrate moderate P2 stenosis and a 3 to 4 mm outpouching from the supraclinoid right ICA which could represent vascular infundibulum versus aneurysm. Extensive atherosclerosis was also noted. She is not on any home anti-thrombotic or OAC. She received aspirin  81mg  in the ED.   On exam today, she denied any vision changes or headache. She had no confusion or focal deficits.   Patient has a history of migraines but denied having one today with the vision loss. Denied migraine or headache at time of neurology exam.   ROS  Comprehensive ROS performed and pertinent positives documented in HPI   Past History   Past Medical History:  Diagnosis Date   Hashimoto's thyroiditis    Hypothyroidism    Migraines    Thyroid  cancer (HCC)     Past Surgical History:  Procedure Laterality Date   APPENDECTOMY     BREAST CYST ASPIRATION Right 2014   BREAST EXCISIONAL BIOPSY Left 2008   benign   cataract surgery Bilateral 2020   CESAREAN SECTION     CHOLECYSTECTOMY     FOOT SURGERY     THYROIDECTOMY     uterine fibroid removal      Family History: Family History  Problem Relation Age of Onset   Headache Mother        occasional   Headache Sister    Rheum arthritis Sister    Headache Maternal Aunt        took many medications    Headache Paternal Grandmother    Headache Cousin     Headache Niece    Headache Other    Stroke Neg Hx    Breast cancer Neg Hx     Social History  reports that she has never smoked. She has never used smokeless tobacco. She reports that she does not drink alcohol and does not use drugs.  Allergies  Allergen Reactions   Paxil [Paroxetine Hcl] Other (See Comments)    syncope   Celecoxib Swelling and Other (See Comments)    Swelling of ankles and feet for two weeks   Ezetimibe Other (See Comments)    Other Reaction(s): muscle pain   Propranolol Hcl Other (See Comments)    Other Reaction(s): Bloated   Atorvastatin Other (See Comments)    Other reaction(s): Myalgias (muscle pain)    Liothyronine Nausea And Vomiting    Other Reaction(s): upset stomach   Rosuvastatin Calcium Other (See Comments)    Muscle pain   Statins Other (See Comments)    Muscle pain   Sulfamethoxazole Nausea And Vomiting   Zonisamide Other (See Comments)    Medications   Current Facility-Administered Medications:    [START ON 05/08/2024]  stroke: early stages of recovery book, , Does not apply, Once, Seena Marsa NOVAK, MD   acetaminophen  (TYLENOL ) tablet 650 mg, 650 mg, Oral, Q4H PRN **OR** acetaminophen  (TYLENOL ) 160 MG/5ML solution 650 mg,  650 mg, Per Tube, Q4H PRN **OR** acetaminophen  (TYLENOL ) suppository 650 mg, 650 mg, Rectal, Q4H PRN, Melvin, Alexander B, MD   [START ON 05/08/2024] aspirin  EC tablet 81 mg, 81 mg, Oral, Daily, Melvin, Alexander B, MD   enoxaparin  (LOVENOX ) injection 40 mg, 40 mg, Subcutaneous, Q24H, Melvin, Alexander B, MD   senna-docusate (Senokot-S) tablet 1 tablet, 1 tablet, Oral, QHS PRN, Melvin, Alexander B, MD  Vitals   Vitals:   05/07/24 0700 05/07/24 0730 05/07/24 0800 05/07/24 1014  BP: 137/80 135/81  (!) 148/91  Pulse: 68 65  77  Resp: 19 18  19   Temp:   97.9 F (36.6 C) 98.1 F (36.7 C)  TempSrc:   Oral Oral  SpO2: 94% 94%  96%    There is no height or weight on file to calculate BMI.   Physical Exam    Constitutional: Appears well-developed and well-nourished.  Cardiovascular: Normal rate and regular rhythm.  Respiratory: Effort normal, non-labored breathing.   Neurologic Examination   Neuro: Mental Status: Patient is awake, alert, oriented to person, place, month, year, and situation. Patient is able to give a clear and coherent history. No signs of aphasia or neglect Cranial Nerves: II: Visual Fields are full. Pupils are equal, round, and reactive to light.   III,IV, VI: EOMI without ptosis or diploplia.  V: Facial sensation is symmetric to temperature VII: Facial movement is symmetric.  VIII: hearing is intact to voice X: No dysarthria.  XI: Shoulder shrug is symmetric. XII: tongue is midline without atrophy or fasciculations.  Motor: Tone is normal. Bulk is normal. 5/5 strength was present in all four extremities.  Sensory: Sensation is symmetric to light touch and temperature in the arms and legs. Cerebellar: FNF and HKS are intact bilaterally   Labs/Imaging/Neurodiagnostic studies   CBC:  Recent Labs  Lab 05/24/2024 1951  WBC 11.3*  HGB 14.7  HCT 44.0  MCV 88.5  PLT 304   Basic Metabolic Panel:  Lab Results  Component Value Date   NA 140 05/24/2024   K 4.1 05/24/24   CO2 23 24-May-2024   GLUCOSE 115 (H) May 24, 2024   BUN 31 (H) 24-May-2024   CREATININE 0.95 24-May-2024   CALCIUM 8.8 (L) 05-24-24   GFRNONAA 60 (L) May 24, 2024   GFRAA  06/08/2007    >60        The eGFR has been calculated using the MDRD equation. This calculation has not been validated in all clinical    CT Head without contrast(Personally reviewed): No acute intracranial abnormality Remote lacunar infarcts left basal ganglia Moderate chronic microvascular ischemic disease  CT angio Head and Neck with contrast(Personally reviewed): No LVO or other emergent finding Moderate right P2 stenosis 2 to 3 mm left paraophthalmic aneurysm Additional 3 to 4 mm out pouching supraclinoid  right ICA, possible additional aneurysm  MRI Brain(Personally reviewed): No evidence of acute intracranial abnormality Chronic lacunar infarcts left basal ganglia Moderate white matter chronic small vessel ischemic disease Tiny chronic infarct right cerebellar  ASSESSMENT   Melinda T Blankenship is a 83 y.o. female with hx of migraines, previous CVA who presented  due to sudden vision loss in right eye, since resolved.    CT head negative.  MRI negative acute, chronic lacunar infarct left basal ganglia and tiny chronic infarct right cerebellar. CTA of the head and neck showed moderate P2 stenosis and a 3 to 4 mm outpouching from the supraclinoid right ICA which could represent aneurysm. Extensive atherosclerosis was also noted. She is  not on any home anti-thrombotic or OAC. She received aspirin  81mg  in the ED.   On exam today, she denied any vision changes She had no confusion or focal deficits.  Patient has a history of migraines but denied having one today with the vision loss. Denied migraine or headache at time of neurology exam.   She is pending ECHO and lipid panel as part of a TIA workup. A1c is within goal parameters. Possible aneurysm seen in right ICA could be affecting the blood flow and perfusion to the eye resulting in the transient vision loss.   RECOMMENDATIONS   - Continue aspirin  81mg  - STAT CT with aucte nruo change - Frequent Neuro checks per stroke unit protocol - TTE pending - Lipid panel pending  Statin - will be started if LDL>70 or otherwise medically indicated - Antithrombotic - Continue Aspirin   - DVT ppx - ok for lovenox  - SBP goal - <180 due to possible aneurysm seen. However, need to avoid hypotension as extensive atherosclerosis is present in head and neck.  - Telemetry monitoring for arrhythmia - 72h - Swallow screen - will be performed prior to PO intake - Stroke education - will be given -  PT/OT/SLP  _____________________________________________________________________  Signed, Rocky JAYSON Likes, NP [facility]   I have seen the patient and reviewed the above note.  She had transient vision loss of the right eye consistent with amaurosis fugax.  No severe carotid stenosis to explain the finding, and therefore I do think she needs cardiac workup as well.  She has a history of statin intolerance, so if her LDL is greater than 70 I would start off low with Lipitor 10 mg to see if she is able to tolerate it.  PSK nine inhibitors would be another consideration.  Stroke team to follow.  Aisha Seals, MD [facility]   If 7pm- 7am, please page neurology on call as listed in AMION.

## 2024-05-07 NOTE — Progress Notes (Signed)
   05/07/24 2129  BiPAP/CPAP/SIPAP  Reason BIPAP/CPAP not in use Non-compliant   Pt states she does not wear a CPAP for nighttime use.

## 2024-05-07 NOTE — Progress Notes (Signed)
*  PRELIMINARY RESULTS* Echocardiogram 2D Echocardiogram has been performed.  [name] 05/07/2024, 3:04 PM

## 2024-05-07 NOTE — H&P (Addendum)
 History and Physical   [name] FMW:[id number] DOB: 1941/02/12 DOA: 05/06/2024  PCP: [name], MD   Patient coming from: Home  Chief Complaint: Vision loss  HPI: [name] is a 83 y.o. female with medical history significant of hypertension, hypothyroidism, RLS, PLMD, thyroid  cancer s/p resection, OSA, migraines presenting with transient vision loss.  Patient presented to [facility] ED yesterday after an episode of sudden vision loss.  Patient had sudden onset of vision loss in her right eye which lasted for several minutes but has since resolved spontaneously.  Does have history of migraine but did not have any associated migraine yesterday.  Denies other neurologic deficits.  Further denies fevers, chills, chest pain, shortness of breath, abdominal pain, constipation, diarrhea, nausea, vomiting.  * vital signs in the ED notable for blood pressure in the 120s-190 systolic, heart rate in the 60s-100s.  Lab workup included CMP with BUN 31, glucose 115, calcium 8.8.  CBC with leukocytosis to 11.5.  ESR**  ED Course: Normal.  CTA of the head and neck showed no evidence of LVO or other emergent finding.  Did demonstrate moderate P2 stenosis and a 3 to 4 mm outpouching from the supraclinoid right ICA which could represent vascular infundibulum versus aneurysm.  Extensive atherosclerosis was also noted.  Neurology consulted and recommended TIA workup.  Patient received ASA in the ED.  Review of Systems: As per HPI otherwise all other systems reviewed and are negative.  Past Medical History:  Diagnosis Date   Hashimoto's thyroiditis    Hypothyroidism    Migraines    Thyroid  cancer [facility])     Past Surgical History:  Procedure Laterality Date   APPENDECTOMY     BREAST CYST ASPIRATION Right 2014   BREAST EXCISIONAL BIOPSY Left 2008   benign   cataract surgery Bilateral 2020   CESAREAN SECTION     CHOLECYSTECTOMY     FOOT SURGERY     THYROIDECTOMY     uterine fibroid  removal      Social History  reports that she has never smoked. She has never used smokeless tobacco. She reports that she does not drink alcohol and does not use drugs.  Allergies  Allergen Reactions   Paxil [Paroxetine Hcl] Other (See Comments)    syncope   Celecoxib Swelling and Other (See Comments)    Swelling of ankles and feet for two weeks   Ezetimibe Other (See Comments)    Other Reaction(s): muscle pain   Propranolol Hcl Other (See Comments)    Other Reaction(s): Bloated   Atorvastatin Other (See Comments)    Other reaction(s): Myalgias (muscle pain)    Liothyronine Nausea And Vomiting    Other Reaction(s): upset stomach   Rosuvastatin Calcium Other (See Comments)    Muscle pain   Statins Other (See Comments)    Muscle pain   Sulfamethoxazole Nausea And Vomiting   Zonisamide Other (See Comments)    Family History  Problem Relation Age of Onset   Headache Mother        occasional   Headache Sister    Rheum arthritis Sister    Headache Maternal Aunt        took many medications    Headache Paternal Grandmother    Headache Cousin    Headache Niece    Headache Other    Stroke Neg Hx    Breast cancer Neg Hx   Reviewed on admission  Prior to Admission medications   Medication Sig Start  Date End Date Taking? Authorizing Provider  Cholecalciferol (VITAMIN D3) 50 MCG (2000 UT) capsule Take 2 capsules by mouth daily.    [provider]  diphenhydramine-acetaminophen  (TYLENOL  PM) 25-500 MG TABS tablet Take 1 tablet by mouth at bedtime as needed.    [provider]  ergotamine-caffeine (CAFERGOT) 1-100 MG tablet Take 2 tablets by mouth every hour as needed for headache or migraine. Two tablets at onset of attack; then 1 tablet every 30 minutes as needed; maximum: 6 tablets per attack; do not exceed 10 tablets/week    [provider]  Multiple Vitamin (MULTI-VITAMIN) tablet Take 1 tablet by mouth daily.    [provider]   progesterone  (PROMETRIUM ) 200 MG capsule Take 200 mg by mouth daily.    [provider]  TIROSINT 125 MCG CAPS Take 125 mcg by mouth daily.  05/31/19   [provider]    Physical Exam: Vitals:   05/07/24 0700 05/07/24 0730 05/07/24 0800 05/07/24 1014  BP: 137/80 135/81  (!) 148/91  Pulse: 68 65  77  Resp: 19 18  19   Temp:   97.9 F (36.6 C) 98.1 F (36.7 C)  TempSrc:   Oral Oral  SpO2: 94% 94%  96%    Physical Exam Constitutional:      General: She is not in acute distress.    Appearance: Normal appearance.  HENT:     Head: Normocephalic and atraumatic.     Mouth/Throat:     Mouth: Mucous membranes are moist.     Pharynx: Oropharynx is clear.  Eyes:     Extraocular Movements: Extraocular movements intact.     Pupils: Pupils are equal, round, and reactive to light.  Cardiovascular:     Rate and Rhythm: Normal rate and regular rhythm.     Pulses: Normal pulses.     Heart sounds: Normal heart sounds.  Pulmonary:     Effort: Pulmonary effort is normal. No respiratory distress.     Breath sounds: Normal breath sounds.  Abdominal:     General: Bowel sounds are normal. There is no distension.     Palpations: Abdomen is soft.     Tenderness: There is no abdominal tenderness.  Musculoskeletal:        General: No swelling or deformity.  Skin:    General: Skin is warm and dry.  Neurological:     Comments: Mental Status: Patient is awake, alert, oriented x3  No signs of aphasia or neglect Cranial Nerves: II: Pupils equal, round, and reactive to light.   III,IV, VI: EOMI without ptosis or diploplia.  V: Facial sensation is symmetric to light touch. VII: Facial movement is symmetric.  VIII: hearing is intact to voice X: Uvula elevates symmetrically XI: Shoulder shrug is symmetric. XII: tongue is midline without atrophy or fasciculations.  Motor: Good effort thorughout, at Least 5/5 bilateral UE, 5/5 bilateral lower extremitiy  Sensory: Sensation is  grossly intact bilateral UEs & LEs Cerebellar: Finger-Nose intact bilalat    Labs on Admission: I have personally reviewed following labs and imaging studies  CBC: Recent Labs  Lab 05/06/24 1951  WBC 11.3*  HGB 14.7  HCT 44.0  MCV 88.5  PLT 304    Basic Metabolic Panel: Recent Labs  Lab 05/06/24 1951  NA 140  K 4.1  CL 105  CO2 23  GLUCOSE 115*  BUN 31*  CREATININE 0.95  CALCIUM 8.8*    GFR: CrCl cannot be calculated (Unknown ideal weight.).  Liver Function Tests:  No results for input(s): AST, ALT, ALKPHOS, BILITOT, PROT, ALBUMIN in the last 168 hours.  Urine analysis:    Component Value Date/Time   COLORURINE YELLOW 10/19/2022 1813   APPEARANCEUR HAZY (A) 10/19/2022 1813   LABSPEC 1.013 10/19/2022 1813   PHURINE 5.0 10/19/2022 1813   GLUCOSEU NEGATIVE 10/19/2022 1813   HGBUR NEGATIVE 10/19/2022 1813   BILIRUBINUR NEGATIVE 10/19/2022 1813   KETONESUR NEGATIVE 10/19/2022 1813   PROTEINUR NEGATIVE 10/19/2022 1813   NITRITE NEGATIVE 10/19/2022 1813   LEUKOCYTESUR NEGATIVE 10/19/2022 1813    Radiological Exams on Admission: CT ANGIO HEAD NECK W WO CM Result Date: 05/06/2024 CLINICAL DATA:  Initial evaluation for amaurosis fugax. Laterality not specified. EXAM: CT ANGIOGRAPHY HEAD AND NECK WITH AND WITHOUT CONTRAST TECHNIQUE: Multidetector CT imaging of the head and neck was performed using the standard protocol during bolus administration of intravenous contrast. Multiplanar CT image reconstructions and MIPs were obtained to evaluate the vascular anatomy. Carotid stenosis measurements (when applicable) are obtained utilizing NASCET criteria, using the distal internal carotid diameter as the denominator. RADIATION DOSE REDUCTION: This exam was performed according to the departmental dose-optimization program which includes automated exposure control, adjustment of the mA and/or kV according to patient size and/or use of iterative reconstruction technique.  CONTRAST:  75mL OMNIPAQUE  IOHEXOL  350 MG/ML SOLN COMPARISON:  Prior study from 01/18/2017 FINDINGS: CT HEAD FINDINGS Brain: Age-related cerebral atrophy with moderate chronic microvascular ischemic disease. Remote lacunar infarcts present about the left basal ganglia. No acute intracranial hemorrhage. No acute large vessel territory infarct. No mass lesion or midline shift. No hydrocephalus or extra-axial fluid collection. Vascular: No asymmetric hyperdense vessel. Calcified atherosclerosis present at the skull base. Skull: No acute finding. Sinuses/Orbits: Globes orbital soft tissues within normal limits. Paranasal sinuses and mastoid air cells are largely clear. Other: None. Review of the MIP images confirms the above findings CTA NECK FINDINGS Aortic arch: Visualized aortic arch within normal limits for caliber. Extensive aortic atherosclerosis with soft plaque protruding into the aortic lumen of the distal arch (series 10, image 270). No visible high-grade stenosis about the origin the great vessels. Right carotid system: Right common and internal carotid arteries are tortuous and ectatic but patent without stenosis or dissection. Left carotid system: Left common and internal carotid arteries are tortuous and ectatic but patent without stenosis or dissection. Vertebral arteries: Left vertebral artery dominant. Vertebral arteries are tortuous but patent without stenosis or dissection. Skeleton: No worrisome osseous lesions. Exaggeration of the normal cervical lordosis with mild multilevel cervical spondylosis. Other neck: No other acute finding.  Prior thyroidectomy. Upper chest: No other acute finding. Review of the MIP images confirms the above findings CTA HEAD FINDINGS Anterior circulation: Atheromatous change about the carotid siphons without hemodynamically significant stenosis. 2-3 mm left paraophthalmic aneurysm (series 10, image 95). Additional 3-4 mm outpouching extending inferiorly from the  supraclinoid right ICA could reflect a vascular infundibulum or possibly additional aneurysm (series 10, image 98). A1 segments patent bilaterally. Normal anterior communicating artery complex. Anterior cerebral arteries patent without significant stenosis. No M1 stenosis or occlusion. Distal MCA branches perfused and symmetric. Posterior circulation: Left vertebral artery strongly dominant and patent without stenosis. Left PICA patent. Right V4 segment patent as well. Right PICA patent at its origin. Mild atheromatous irregularity about the basilar without significant stenosis. Superior cerebral arteries patent bilaterally. Right PCA supplied via the basilar. Fetal type origin of the left PCA. Right PCA somewhat tortuous and irregular with superimposed moderate right P2 stenosis (series 13, image  21). PCAs remain patent to their distal aspects. Venous sinuses: Grossly patent allowing for timing the contrast bolus. Anatomic variants: As above. Review of the MIP images confirms the above findings IMPRESSION: CT HEAD: 1. No acute intracranial abnormality. 2. Age-related cerebral atrophy with moderate chronic microvascular ischemic disease, with remote lacunar infarcts about the left basal ganglia. CTA HEAD AND NECK: 1. Negative CTA for large vessel occlusion or other emergent finding. 2. Moderate right P2 stenosis. 3. 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 4. Additional 3-4 mm outpouching extending inferiorly from the supraclinoid right ICA, which could reflect a vascular infundibulum or possibly additional aneurysm. Attention at follow-up recommended. 5. Extensive aortic atherosclerosis with soft plaque protruding into the aortic lumen of the distal arch. 6. Diffuse tortuosity and ectasia of the major arterial vasculature of the head and neck, suggesting chronic underlying hypertension. Aortic Atherosclerosis (ICD10-I70.0). Electronically Signed   By: Morene Hoard M.D.   On: 05/06/2024 21:24   EKG:  Independently reviewed.  Sinus rhythm at 85 bpm.  Nonspecific T wave changes.  Assessment/Plan Principal Problem:   TIA (transient ischemic attack) Active Problems:   Benign essential hypertension   Papillary adenocarcinoma, follicular variant (HCC)   Postoperative hypothyroidism   Chronic migraine without aura, with intractable migraine, so stated, with status migrainosus   OSA on CPAP   RLS (restless legs syndrome)   PLMD (periodic limb movement disorder)   TIA > Patient presenting with transient, several minutes, vision loss of the right eye.  Consistent with amaurosis fugax. > Return to baseline in the ED.  CTA head and neck showed no LVO or other emergent finding.  Did have moderate PD stenosis and 3 to 4 mm outpouching suspicious for vascular infundibulum versus aneurysm.  Extensive atherosclerosis also noted. > Neurology consulted in the ED recommended TIA workup and ASA. - Neurology notified of patient arrival. - Continue daily aspirin  - Consider statin pending lipid panel results - Echocardiogram  - MR Brain - A1C  - Lipid panel  - Tele monitoring  - SLP eval - PT/OT  Hypertension - Not currently on antihypertensive  Hypothyroidism > Hx of thyroid  cancer s/p resection - Continue home levothyroxine  OSA - Continue CPAP  HTN > Hx of this but has had issues with hypotension on medications so current not on antihypertensives.  Migraine history - Continue caffeine plus ergotamine as needed  DVT prophylaxis: Lovenox  Code Status:   Full  Family Communication:  None on admission  Disposition Plan:   Patient is from:  Home  Anticipated DC to:  Home  Anticipated DC date:  1 to 2 days  Anticipated DC barriers: None  Consults called:  Neurology  Admission status:  Observation, telemetry  Severity of Illness: The appropriate patient status for this patient is OBSERVATION. Observation status is judged to be reasonable and necessary in order to provide the required  intensity of service to ensure the patient's safety. The patient's presenting symptoms, physical exam findings, and initial radiographic and laboratory data in the context of their medical condition is felt to place them at decreased risk for further clinical deterioration. Furthermore, it is anticipated that the patient will be medically stable for discharge from the hospital within 2 midnights of admission.    Marsa KATHEE Scurry MD [facility]  How to contact the [facility] Attending or Consulting provider 7A - 7P or covering provider during after hours 7P -7A, for this patient?   Check the care team in [facility] and look for a) attending/consulting [facility]  provider listed and b) the [facility] team listed Log into [email or web address] and use [place]'s universal password to access. If you do not have the password, please contact the hospital operator. Locate the [facility] provider you are looking for under [facility] and page to a number that you can be directly reached. If you still have difficulty reaching the provider, please page the [facility] (Director on Call) for the Hospitalists listed on amion for assistance.  05/07/2024, 12:40 PM

## 2024-05-08 ENCOUNTER — Other Ambulatory Visit (HOSPITAL_COMMUNITY): Payer: Self-pay

## 2024-05-08 ENCOUNTER — Encounter: Payer: Self-pay | Admitting: Hematology & Oncology

## 2024-05-08 DIAGNOSIS — G4733 Obstructive sleep apnea (adult) (pediatric): Secondary | ICD-10-CM | POA: Diagnosis not present

## 2024-05-08 DIAGNOSIS — I10 Essential (primary) hypertension: Secondary | ICD-10-CM | POA: Diagnosis not present

## 2024-05-08 DIAGNOSIS — G43711 Chronic migraine without aura, intractable, with status migrainosus: Secondary | ICD-10-CM | POA: Diagnosis not present

## 2024-05-08 DIAGNOSIS — G459 Transient cerebral ischemic attack, unspecified: Secondary | ICD-10-CM | POA: Diagnosis not present

## 2024-05-08 LAB — LIPID PANEL
Cholesterol: 192 mg/dL (ref 0–200)
HDL: 60 mg/dL (ref >40)
LDL Cholesterol: 123 mg/dL — ABNORMAL HIGH (ref 0–99)
Total CHOL/HDL Ratio: 3.2 ratio
Triglycerides: 45 mg/dL (ref <150)
VLDL: 9 mg/dL (ref 0–40)

## 2024-05-08 MED ORDER — CLOPIDOGREL BISULFATE 75 MG PO TABS
75.0000 mg | ORAL_TABLET | Freq: Every day | ORAL | 0 refills | Status: DC
  Filled 2024-05-08: qty 21, 21d supply, fill #0

## 2024-05-08 MED ORDER — CLOPIDOGREL BISULFATE 75 MG PO TABS
75.0000 mg | ORAL_TABLET | Freq: Every day | ORAL | Status: DC
  Administered 2024-05-08: 75 mg via ORAL
  Filled 2024-05-08: qty 1

## 2024-05-08 MED ORDER — ASPIRIN 81 MG PO TBEC
81.0000 mg | DELAYED_RELEASE_TABLET | Freq: Every day | ORAL | 12 refills | Status: AC
  Filled 2024-05-08: qty 30, 30d supply, fill #0

## 2024-05-08 NOTE — Progress Notes (Signed)
 PT Cancellation Note  Patient Details Name: [name] MRN: [id number] DOB: [birth year]   Cancelled Treatment:    Reason Eval/Treat Not Completed: PT screened, no needs identified, will sign off. Pt at baseline per OT.   Rodgers ORN [facility] 05/08/2024, 9:58 AM Rodgers Opal PT Acute [employer] [phone]

## 2024-05-08 NOTE — Progress Notes (Signed)
 Discharge instructions (including medications) discussed with and copy provided to patient. Patient given opportunity to ask questions. Questions clarified.

## 2024-05-08 NOTE — Evaluation (Signed)
 Occupational Therapy Evaluation Patient Details Name: [name] MRN: [id number] DOB: 1941-09-08 Today's Date: 05/08/2024   History of Present Illness   Pt is a 83 yr old female who presented 05/06/24 due to vision loss in her R eye. CT showed no acute findings, remote left basal ganglia lacunar infarcts. PMH: HTN, RLS, PLMD, thyroid  cancer s/p resection, OSA, migraines     Clinical Impressions Pt reported at PLOF they normally do not use any DME unless they have a flare up with arthritis. She lives with her husband and has 3 steps to enter the home. At this time she was indep in all ADLS and was able to ambulate with no DME and completed a flight of stairs with supervision and single rail. She reports she is back to her baseline and no further changes in vision. Acute Occupational Therapy singing off.      If plan is discharge home, recommend the following:   Assist for transportation     Functional Status Assessment   Patient has had a recent decline in their functional status and demonstrates the ability to make significant improvements in function in a reasonable and predictable amount of time.     Equipment Recommendations   None recommended by OT     Recommendations for Other Services         Precautions/Restrictions   Precautions Precautions: None Recall of Precautions/Restrictions: Intact Restrictions Weight Bearing Restrictions Per Provider Order: No     Mobility Bed Mobility Overal bed mobility: Independent                  Transfers Overall transfer level: Independent Equipment used: None                      Balance Overall balance assessment: Independent                                         ADL either performed or assessed with clinical judgement   ADL Overall ADL's : Independent;Modified independent                                             Vision Baseline Vision/History: 1  Wears glasses Ability to See in Adequate Light: 0 Adequate Patient Visual Report: No change from baseline Vision Assessment?: Wears glasses for reading Additional Comments: reported vision back to baseline     Perception Perception: Within Functional Limits       Praxis Praxis: WFL       Pertinent Vitals/Pain Pain Assessment Pain Assessment: No/denies pain     Extremity/Trunk Assessment Upper Extremity Assessment Upper Extremity Assessment: Overall WFL for tasks assessed   Lower Extremity Assessment Lower Extremity Assessment: Overall WFL for tasks assessed   Cervical / Trunk Assessment Cervical / Trunk Assessment: Normal   Communication Communication Communication: No apparent difficulties   Cognition Arousal: Alert Behavior During Therapy: WFL for tasks assessed/performed Cognition: No apparent impairments                               Following commands: Intact       Cueing  General Comments   Cueing Techniques: Verbal cues      Exercises  Shoulder Instructions      Home Living Family/patient expects to be discharged to:: Private residence Living Arrangements: Spouse/significant other Available Help at Discharge: Family Type of Home: House Home Access: Stairs to enter Secretary/administrator of Steps: 3 Entrance Stairs-Rails: Left Home Layout: One level     Bathroom Shower/Tub: Producer, television/film/video: Standard Bathroom Accessibility: Yes How Accessible: Accessible via walker Home Equipment: Rollator (4 wheels)          Prior Functioning/Environment Prior Level of Function : Independent/Modified Independent             Mobility Comments: reported no DME normally only if arthritis flares up, enjoys working in the garden and shopping ADLs Comments: indep    OT Problem List: Impaired balance (sitting and/or standing)   OT Treatment/Interventions:        OT Goals(Current goals can be found in the care  plan section)   Acute Rehab OT Goals Patient Stated Goal: to go home OT Goal Formulation: With patient Time For Goal Achievement: 05/22/24 Potential to Achieve Goals: Good   OT Frequency:       Co-evaluation              AM-PAC OT 6 Clicks Daily Activity     Outcome Measure Help from another person eating meals?: None Help from another person taking care of personal grooming?: None Help from another person toileting, which includes using toliet, bedpan, or urinal?: None Help from another person bathing (including washing, rinsing, drying)?: None Help from another person to put on and taking off regular upper body clothing?: None Help from another person to put on and taking off regular lower body clothing?: None 6 Click Score: 24   End of Session Equipment Utilized During Treatment: Gait belt Nurse Communication: Mobility status  Activity Tolerance: Patient tolerated treatment well Patient left: in chair;with call bell/phone within reach  OT Visit Diagnosis: Unsteadiness on feet (R26.81)                Time: 9189-9164 OT Time Calculation (min): 25 min Charges:  OT General Charges $OT Visit: 1 Visit OT Evaluation $OT Eval Low Complexity: 1 Low OT Treatments $Self Care/Home Management : 8-22 mins  Warrick POUR OTR/L  Acute Rehab Services  [phone] office number   Warrick Berber 05/08/2024, 8:45 AM

## 2024-05-08 NOTE — Discharge Summary (Signed)
 PATIENT DETAILS Name: [name] Age: 83 y.o. Sex: female Date of Birth: 12/14/1940 MRN: [id number]. Admitting Physician: No admitting provider for patient encounter. ERE:Mndd, Carlin Redbird, MD  Admit Date: 05/06/2024 Discharge date: 05/08/2024  Recommendations for Outpatient Follow-up:  Follow up with PCP in 1-2 weeks Please obtain CMP/CBC in one week Please ensure follow-up with neurology.  Admitted From:  Home  Disposition: Home   Discharge Condition: good  CODE STATUS:   Code Status: Full Code   Diet recommendation:  Diet Order             Diet - low sodium heart healthy           Diet regular Room service appropriate? Yes; Fluid consistency: Thin  Diet effective now                    Brief Summary: 83 year old female with history of HTN, hypothyroidism, migraine headaches, thyroid  cancer-s/p resection, RLS who presented with transient visual loss of right eye  Significant studies 7/5>> CTA head/neck: No LVO or significant stenosis.  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 7/6>> MRI brain: No acute intracranial abnormality. 7/6>> TTE: EF 60-65% 7/6>> A1c: 5.5 7/7>> LDL: 123  Brief Hospital Course: Painless transient visual loss of right eye Resolved Unclear whether this was amaurosis fugax (soft plaque in aortic lumen on CTA but no major stenosis)-versus complicated migraine.  Workup as above Discussed with neurologist/stroke MD-Dr. Jim /Plavix  x 3 weeks followed by aspirin  alone. She is unfortunately intolerant to statins-and is very hesitant about being started on PCSK9 inhibitor-she will follow-up with her primary care practitioner for further continued discussion.  Hypothyroidism Synthroid  History of migraine headaches See above Continue prn caffeine/ergot  HTN No longer on antihypertensives given issues with  hypotension  OSA CPAP.  Class 1 Obesity: Estimated body mass index is 30.4 kg/m as calculated from the following:   Height as of this encounter: 5' 2 (1.575 m).   Weight as of this encounter: 75.4 kg.    Discharge Diagnoses:  Principal Problem:   TIA (transient ischemic attack) Active Problems:   Benign essential hypertension   Papillary adenocarcinoma, follicular variant (HCC)   Postoperative hypothyroidism   Chronic migraine without aura, with intractable migraine, so stated, with status migrainosus   OSA on CPAP   RLS (restless legs syndrome)   PLMD (periodic limb movement disorder)   Discharge Instructions:  Activity:  As tolerated Discharge Instructions     Ambulatory referral to Neurology   Complete by: As directed    An appointment is requested in approximately: 4 weeks   Call MD for:  extreme fatigue   Complete by: As directed    Call MD for:  persistant dizziness or light-headedness   Complete by: As directed    Diet - low sodium heart healthy   Complete by: As directed    Discharge instructions   Complete by: As directed    Follow with Primary MD  [name], MD in 1-2 weeks  Follow-up with [facility] neurology-their office will call you with appointment-I do not hear from them the next several days-please give them a call.  Take aspirin /Plavix  for 3 weeks, after having stopped Plavix  and continue on aspirin .  Since you are intolerant to statins (medications like Lipitor)-please talk to your primary care practitioner PCSK9 inhibitor (injections) to lower your cholesterol level to reduce your risk  of having CVAs/transient ischemic attacks.  Please get a complete blood count and chemistry panel checked by your Primary MD at your next visit, and again as instructed by your Primary MD.  Get Medicines reviewed and adjusted: Please take all your medications with you for your next visit with your Primary MD  Laboratory/radiological data: Please  request your Primary MD to go over all hospital tests and procedure/radiological results at the follow up, please ask your Primary MD to get all Hospital records sent to his/her office.  In some cases, they will be blood work, cultures and biopsy results pending at the time of your discharge. Please request that your primary care M.D. follows up on these results.  Also Note the following: If you experience worsening of your admission symptoms, develop shortness of breath, life threatening emergency, suicidal or homicidal thoughts you must seek medical attention immediately by calling 911 or calling your MD immediately  if symptoms less severe.  You must read complete instructions/literature along with all the possible adverse reactions/side effects for all the Medicines you take and that have been prescribed to you. Take any new Medicines after you have completely understood and accpet all the possible adverse reactions/side effects.   Do not drive when taking Pain medications or sleeping medications (Benzodaizepines)  Do not take more than prescribed Pain, Sleep and Anxiety Medications. It is not advisable to combine anxiety,sleep and pain medications without talking with your primary care practitioner  Special Instructions: If you have smoked or chewed Tobacco  in the last 2 yrs please stop smoking, stop any regular Alcohol  and or any Recreational drug use.  Wear Seat belts while driving.  Please note: You were cared for by a hospitalist during your hospital stay. Once you are discharged, your primary care physician will handle any further medical issues. Please note that NO REFILLS for any discharge medications will be authorized once you are discharged, as it is imperative that you return to your primary care physician (or establish a relationship with a primary care physician if you do not have one) for your post hospital discharge needs so that they can reassess your need for medications and  monitor your lab values.   Increase activity slowly   Complete by: As directed       Allergies as of 05/08/2024       Reactions   Paxil [paroxetine Hcl] Other (See Comments)   syncope   Celecoxib Swelling, Other (See Comments)   Swelling of ankles and feet for two weeks   Ezetimibe Other (See Comments)   Other Reaction(s): muscle pain   Propranolol Hcl Other (See Comments)   Other Reaction(s): Bloated   Atorvastatin Other (See Comments)   Other reaction(s): Myalgias (muscle pain)   Liothyronine Nausea And Vomiting   Other Reaction(s): upset stomach   Progesterone  Swelling, Other (See Comments)   Swelling in feet    Rosuvastatin Calcium Other (See Comments)   Muscle pain   Statins Other (See Comments)   Muscle pain   Sulfamethoxazole Nausea And Vomiting   Zonisamide Nausea And Vomiting, Other (See Comments)   BM turns white         Medication List     TAKE these medications    aspirin  EC 81 MG tablet Take 1 tablet (81 mg total) by mouth daily. Swallow whole. Start taking on: May 09, 2024   clopidogrel  75 MG tablet Commonly known as: PLAVIX  Take 1 tablet (75 mg total) by mouth daily. Start taking  on: May 09, 2024   diphenhydramine-acetaminophen  25-500 MG Tabs tablet Commonly known as: TYLENOL  PM Take 1 tablet by mouth at bedtime as needed.   ergotamine-caffeine 1-100 MG tablet Commonly known as: CAFERGOT Take 2 tablets by mouth every hour as needed for headache or migraine. Two tablets at onset of attack; then 1 tablet every 30 minutes as needed; maximum: 6 tablets per attack; do not exceed 10 tablets/week   Multi-Vitamin tablet Take 1 tablet by mouth daily.   progesterone  200 MG capsule Commonly known as: PROMETRIUM  Take 200 mg by mouth daily.   Tirosint 125 MCG Caps Generic drug: Levothyroxine Sodium Take 125 mcg by mouth daily.   Vitamin D3 50 MCG (2000 UT) capsule Take 2 capsules by mouth daily.        Follow-up Information     [name], MD. Schedule an appointment as soon as possible for a visit in 1 week(s).   Specialty: Family Medicine Contact information: [address] [place] [address] [phone]         [facility] Follow up.   Specialty: Neurology Why: Hospital follow up, Office will call with date/time, If you dont hear from them,please give them a call Contact information: [address] [place] [address] [place]  [phone]               Allergies  Allergen Reactions   Paxil [Paroxetine Hcl] Other (See Comments)    syncope   Celecoxib Swelling and Other (See Comments)    Swelling of ankles and feet for two weeks   Ezetimibe Other (See Comments)    Other Reaction(s): muscle pain   Propranolol Hcl Other (See Comments)    Other Reaction(s): Bloated   Atorvastatin Other (See Comments)    Other reaction(s): Myalgias (muscle pain)    Liothyronine Nausea And Vomiting    Other Reaction(s): upset stomach   Progesterone  Swelling and Other (See Comments)    Swelling in feet    Rosuvastatin Calcium Other (See Comments)    Muscle pain   Statins Other (See Comments)    Muscle pain   Sulfamethoxazole Nausea And Vomiting   Zonisamide Nausea And Vomiting and Other (See Comments)    BM turns white      Other Procedures/Studies: ECHOCARDIOGRAM COMPLETE Result Date: 05/07/2024    ECHOCARDIOGRAM REPORT   Patient Name:   [name] Date of Exam: 05/07/2024 Medical Rec #:  [id number]    Height:       62.0 in Accession #:    [id number]   Weight:       166.2 lb Date of Birth:  September 03, 1941    BSA:          1.767 m Patient Age:    83 years     BP:           148/91 mmHg Patient Gender: F            HR:           77 bpm. Exam Location:  Inpatient Procedure: 2D Echo, Cardiac Doppler and Color Doppler (Both Spectral and Color            Flow Doppler were utilized during procedure). Indications:    TIA G45.9  History:        Patient has prior history of  Echocardiogram examinations, most                 recent 08/31/2022. TIA; Risk  Factors:Hypertension and OSA on                 CPAP.  Sonographer:    Aida Pizza RCS Referring Phys: [id number] MARSA NOVAK MELVIN IMPRESSIONS  1. Left ventricular ejection fraction, by estimation, is 60 to 65%. The left ventricle has normal function. The left ventricle has no regional wall motion abnormalities. There is moderate asymmetric left ventricular hypertrophy of the basal-septal segment. Left ventricular diastolic parameters were normal.  2. Right ventricular systolic function is normal. The right ventricular size is normal. Tricuspid regurgitation signal is inadequate for assessing PA pressure.  3. The mitral valve is normal in structure. Trivial mitral valve regurgitation.  4. The aortic valve is tricuspid. Aortic valve regurgitation is not visualized. Aortic valve sclerosis is present, with no evidence of aortic valve stenosis.  5. The inferior vena cava is normal in size with greater than 50% respiratory variability, suggesting right atrial pressure of 3 mmHg. FINDINGS  Left Ventricle: Left ventricular ejection fraction, by estimation, is 60 to 65%. The left ventricle has normal function. The left ventricle has no regional wall motion abnormalities. The left ventricular internal cavity size was normal in size. There is  moderate asymmetric left ventricular hypertrophy of the basal-septal segment. Left ventricular diastolic parameters were normal. Right Ventricle: The right ventricular size is normal. No increase in right ventricular wall thickness. Right ventricular systolic function is normal. Tricuspid regurgitation signal is inadequate for assessing PA pressure. Left Atrium: Left atrial size was normal in size. Right Atrium: Right atrial size was normal in size. Pericardium: There is no evidence of pericardial effusion. Mitral Valve: The mitral valve is normal in structure. Trivial mitral valve regurgitation. Tricuspid  Valve: The tricuspid valve is normal in structure. Tricuspid valve regurgitation is trivial. Aortic Valve: The aortic valve is tricuspid. Aortic valve regurgitation is not visualized. Aortic valve sclerosis is present, with no evidence of aortic valve stenosis. Pulmonic Valve: The pulmonic valve was not well visualized. Pulmonic valve regurgitation is trivial. Aorta: The aortic root is normal in size and structure. Venous: The inferior vena cava is normal in size with greater than 50% respiratory variability, suggesting right atrial pressure of 3 mmHg. IAS/Shunts: The interatrial septum was not well visualized.  LEFT VENTRICLE PLAX 2D LVIDd:         4.40 cm   Diastology LVIDs:         3.00 cm   LV e' medial:    10.00 cm/s LV PW:         1.10 cm   LV E/e' medial:  4.9 LV IVS:        1.10 cm   LV e' lateral:   11.60 cm/s LVOT diam:     2.00 cm   LV E/e' lateral: 4.2 LV SV:         52 LV SV Index:   30 LVOT Area:     3.14 cm  RIGHT VENTRICLE RV S prime:     15.20 cm/s TAPSE (M-mode): 1.7 cm LEFT ATRIUM             Index        RIGHT ATRIUM           Index LA diam:        3.50 cm 1.98 cm/m   RA Area:     13.80 cm LA Vol (A2C):   35.1 ml 19.86 ml/m  RA Volume:   31.00 ml  17.54 ml/m LA Vol (A4C):  63.2 ml 35.76 ml/m LA Biplane Vol: 51.4 ml 29.09 ml/m  AORTIC VALVE LVOT Vmax:   101.00 cm/s LVOT Vmean:  64.200 cm/s LVOT VTI:    0.166 m  AORTA Ao Root diam: 3.20 cm MITRAL VALVE MV Area (PHT): 3.77 cm    SHUNTS MV Decel Time: 201 msec    Systemic VTI:  0.17 m MV E velocity: 48.60 cm/s  Systemic Diam: 2.00 cm MV A velocity: 75.20 cm/s MV E/A ratio:  0.65 Lonni Nanas MD Electronically signed by Lonni Nanas MD Signature Date/Time: 05/07/2024/6:46:59 PM    Final    MR BRAIN WO CONTRAST Result Date: 05/07/2024 CLINICAL DATA:  Provided history: Neuro deficit, acute, stroke suspected. EXAM: MRI HEAD WITHOUT CONTRAST TECHNIQUE: Multiplanar, multiecho pulse sequences of the brain and surrounding structures  were obtained without intravenous contrast. COMPARISON:  Non-contrast head CT and CT angiogram head/neck 05/06/2024. FINDINGS: Brain: No age-advanced or lobar predominant cerebral atrophy. Chronic lacunar infarcts again demonstrated within/about the left basal ganglia. Moderate multifocal T2 FLAIR hyperintense signal abnormality elsewhere within the cerebral white matter, nonspecific but compatible chronic small vessel ischemic disease. Tiny chronic infarct within the right cerebellar hemisphere (series 10, image 6). There is no acute infarct. No evidence of an intracranial mass. No chronic intracranial blood products. No extra-axial fluid collection. No midline shift. Vascular: Maintained flow voids within the proximal large arterial vessels. Skull and upper cervical spine: No focal worrisome marrow lesion. Sinuses/Orbits: No mass or acute finding within the imaged orbits. No significant paranasal sinus disease. IMPRESSION: 1. No evidence of an acute intracranial abnormality. 2. Chronic lacunar infarcts within/about the left basal ganglia. 3. Moderate background cerebral white matter chronic small vessel ischemic disease. 4. Tiny chronic infarct within the right cerebellar hemisphere. Electronically Signed   By: Rockey Childs D.O.   On: 05/07/2024 14:29   CT ANGIO HEAD NECK W WO CM Result Date: 05/06/2024 CLINICAL DATA:  Initial evaluation for amaurosis fugax. Laterality not specified. EXAM: CT ANGIOGRAPHY HEAD AND NECK WITH AND WITHOUT CONTRAST TECHNIQUE: Multidetector CT imaging of the head and neck was performed using the standard protocol during bolus administration of intravenous contrast. Multiplanar CT image reconstructions and MIPs were obtained to evaluate the vascular anatomy. Carotid stenosis measurements (when applicable) are obtained utilizing NASCET criteria, using the distal internal carotid diameter as the denominator. RADIATION DOSE REDUCTION: This exam was performed according to the departmental  dose-optimization program which includes automated exposure control, adjustment of the mA and/or kV according to patient size and/or use of iterative reconstruction technique. CONTRAST:  75mL OMNIPAQUE  IOHEXOL  350 MG/ML SOLN COMPARISON:  Prior study from 01/18/2017 FINDINGS: CT HEAD FINDINGS Brain: Age-related cerebral atrophy with moderate chronic microvascular ischemic disease. Remote lacunar infarcts present about the left basal ganglia. No acute intracranial hemorrhage. No acute large vessel territory infarct. No mass lesion or midline shift. No hydrocephalus or extra-axial fluid collection. Vascular: No asymmetric hyperdense vessel. Calcified atherosclerosis present at the skull base. Skull: No acute finding. Sinuses/Orbits: Globes orbital soft tissues within normal limits. Paranasal sinuses and mastoid air cells are largely clear. Other: None. Review of the MIP images confirms the above findings CTA NECK FINDINGS Aortic arch: Visualized aortic arch within normal limits for caliber. Extensive aortic atherosclerosis with soft plaque protruding into the aortic lumen of the distal arch (series 10, image 270). No visible high-grade stenosis about the origin the great vessels. Right carotid system: Right common and internal carotid arteries are tortuous and ectatic but patent without stenosis or dissection. Left  carotid system: Left common and internal carotid arteries are tortuous and ectatic but patent without stenosis or dissection. Vertebral arteries: Left vertebral artery dominant. Vertebral arteries are tortuous but patent without stenosis or dissection. Skeleton: No worrisome osseous lesions. Exaggeration of the normal cervical lordosis with mild multilevel cervical spondylosis. Other neck: No other acute finding.  Prior thyroidectomy. Upper chest: No other acute finding. Review of the MIP images confirms the above findings CTA HEAD FINDINGS Anterior circulation: Atheromatous change about the carotid siphons  without hemodynamically significant stenosis. 2-3 mm left paraophthalmic aneurysm (series 10, image 95). Additional 3-4 mm outpouching extending inferiorly from the supraclinoid right ICA could reflect a vascular infundibulum or possibly additional aneurysm (series 10, image 98). A1 segments patent bilaterally. Normal anterior communicating artery complex. Anterior cerebral arteries patent without significant stenosis. No M1 stenosis or occlusion. Distal MCA branches perfused and symmetric. Posterior circulation: Left vertebral artery strongly dominant and patent without stenosis. Left PICA patent. Right V4 segment patent as well. Right PICA patent at its origin. Mild atheromatous irregularity about the basilar without significant stenosis. Superior cerebral arteries patent bilaterally. Right PCA supplied via the basilar. Fetal type origin of the left PCA. Right PCA somewhat tortuous and irregular with superimposed moderate right P2 stenosis (series 13, image 21). PCAs remain patent to their distal aspects. Venous sinuses: Grossly patent allowing for timing the contrast bolus. Anatomic variants: As above. Review of the MIP images confirms the above findings IMPRESSION: CT HEAD: 1. No acute intracranial abnormality. 2. Age-related cerebral atrophy with moderate chronic microvascular ischemic disease, with remote lacunar infarcts about the left basal ganglia. CTA HEAD AND NECK: 1. Negative CTA for large vessel occlusion or other emergent finding. 2. Moderate right P2 stenosis. 3. 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 4. Additional 3-4 mm outpouching extending inferiorly from the supraclinoid right ICA, which could reflect a vascular infundibulum or possibly additional aneurysm. Attention at follow-up recommended. 5. Extensive aortic atherosclerosis with soft plaque protruding into the aortic lumen of the distal arch. 6. Diffuse tortuosity and ectasia of the major arterial vasculature of the head and neck, suggesting  chronic underlying hypertension. Aortic Atherosclerosis (ICD10-I70.0). Electronically Signed   By: Morene Hoard M.D.   On: 05/06/2024 21:24     TODAY-DAY OF DISCHARGE:  Subjective:   Laquenta Kolodny today has no headache,no chest abdominal pain,no new weakness tingling or numbness, feels much better wants to go home today.   Objective:   Blood pressure (!) 178/110, pulse 75, temperature 97.6 F (36.4 C), temperature source Oral, resp. rate 17, height 5' 2 (1.575 m), weight 75.4 kg, SpO2 97%.  Intake/Output Summary (Last 24 hours) at 05/08/2024 0847 Last data filed at 05/08/2024 0800 Gross per 24 hour  Intake 240 ml  Output --  Net 240 ml   Filed Weights   05/07/24 1314  Weight: 75.4 kg    Exam: Awake Alert, Oriented *3, No new F.N deficits, Normal affect [place].AT,PERRAL Supple Neck,No JVD, No cervical lymphadenopathy appriciated.  Symmetrical Chest wall movement, Good air movement bilaterally, CTAB RRR,No Gallops,Rubs or new Murmurs, No Parasternal Heave +ve B.Sounds, Abd Soft, Non tender, No organomegaly appriciated, No rebound -guarding or rigidity. No Cyanosis, Clubbing or edema, No new Rash or bruise   PERTINENT RADIOLOGIC STUDIES: ECHOCARDIOGRAM COMPLETE Result Date: 05/07/2024    ECHOCARDIOGRAM REPORT   Patient Name:   LISBET BUSKER Date of Exam: 05/07/2024 Medical Rec #:  [id number]    Height:       62.0 in Accession #:  [id number]   Weight:       166.2 lb Date of Birth:  Feb 26, 1941    BSA:          1.767 m Patient Age:    83 years     BP:           148/91 mmHg Patient Gender: F            HR:           77 bpm. Exam Location:  Inpatient Procedure: 2D Echo, Cardiac Doppler and Color Doppler (Both Spectral and Color            Flow Doppler were utilized during procedure). Indications:    TIA G45.9  History:        Patient has prior history of Echocardiogram examinations, most                 recent 08/31/2022. TIA; Risk Factors:Hypertension and OSA on                 CPAP.   Sonographer:    Aida Pizza RCS Referring Phys: [id number] MARSA NOVAK MELVIN IMPRESSIONS  1. Left ventricular ejection fraction, by estimation, is 60 to 65%. The left ventricle has normal function. The left ventricle has no regional wall motion abnormalities. There is moderate asymmetric left ventricular hypertrophy of the basal-septal segment. Left ventricular diastolic parameters were normal.  2. Right ventricular systolic function is normal. The right ventricular size is normal. Tricuspid regurgitation signal is inadequate for assessing PA pressure.  3. The mitral valve is normal in structure. Trivial mitral valve regurgitation.  4. The aortic valve is tricuspid. Aortic valve regurgitation is not visualized. Aortic valve sclerosis is present, with no evidence of aortic valve stenosis.  5. The inferior vena cava is normal in size with greater than 50% respiratory variability, suggesting right atrial pressure of 3 mmHg. FINDINGS  Left Ventricle: Left ventricular ejection fraction, by estimation, is 60 to 65%. The left ventricle has normal function. The left ventricle has no regional wall motion abnormalities. The left ventricular internal cavity size was normal in size. There is  moderate asymmetric left ventricular hypertrophy of the basal-septal segment. Left ventricular diastolic parameters were normal. Right Ventricle: The right ventricular size is normal. No increase in right ventricular wall thickness. Right ventricular systolic function is normal. Tricuspid regurgitation signal is inadequate for assessing PA pressure. Left Atrium: Left atrial size was normal in size. Right Atrium: Right atrial size was normal in size. Pericardium: There is no evidence of pericardial effusion. Mitral Valve: The mitral valve is normal in structure. Trivial mitral valve regurgitation. Tricuspid Valve: The tricuspid valve is normal in structure. Tricuspid valve regurgitation is trivial. Aortic Valve: The aortic valve is  tricuspid. Aortic valve regurgitation is not visualized. Aortic valve sclerosis is present, with no evidence of aortic valve stenosis. Pulmonic Valve: The pulmonic valve was not well visualized. Pulmonic valve regurgitation is trivial. Aorta: The aortic root is normal in size and structure. Venous: The inferior vena cava is normal in size with greater than 50% respiratory variability, suggesting right atrial pressure of 3 mmHg. IAS/Shunts: The interatrial septum was not well visualized.  LEFT VENTRICLE PLAX 2D LVIDd:         4.40 cm   Diastology LVIDs:         3.00 cm   LV e' medial:    10.00 cm/s LV PW:         1.10 cm   LV E/e'  medial:  4.9 LV IVS:        1.10 cm   LV e' lateral:   11.60 cm/s LVOT diam:     2.00 cm   LV E/e' lateral: 4.2 LV SV:         52 LV SV Index:   30 LVOT Area:     3.14 cm  RIGHT VENTRICLE RV S prime:     15.20 cm/s TAPSE (M-mode): 1.7 cm LEFT ATRIUM             Index        RIGHT ATRIUM           Index LA diam:        3.50 cm 1.98 cm/m   RA Area:     13.80 cm LA Vol (A2C):   35.1 ml 19.86 ml/m  RA Volume:   31.00 ml  17.54 ml/m LA Vol (A4C):   63.2 ml 35.76 ml/m LA Biplane Vol: 51.4 ml 29.09 ml/m  AORTIC VALVE LVOT Vmax:   101.00 cm/s LVOT Vmean:  64.200 cm/s LVOT VTI:    0.166 m  AORTA Ao Root diam: 3.20 cm MITRAL VALVE MV Area (PHT): 3.77 cm    SHUNTS MV Decel Time: 201 msec    Systemic VTI:  0.17 m MV E velocity: 48.60 cm/s  Systemic Diam: 2.00 cm MV A velocity: 75.20 cm/s MV E/A ratio:  0.65 Lonni Nanas MD Electronically signed by Lonni Nanas MD Signature Date/Time: 05/07/2024/6:46:59 PM    Final    MR BRAIN WO CONTRAST Result Date: 05/07/2024 CLINICAL DATA:  Provided history: Neuro deficit, acute, stroke suspected. EXAM: MRI HEAD WITHOUT CONTRAST TECHNIQUE: Multiplanar, multiecho pulse sequences of the brain and surrounding structures were obtained without intravenous contrast. COMPARISON:  Non-contrast head CT and CT angiogram head/neck 05/06/2024. FINDINGS:  Brain: No age-advanced or lobar predominant cerebral atrophy. Chronic lacunar infarcts again demonstrated within/about the left basal ganglia. Moderate multifocal T2 FLAIR hyperintense signal abnormality elsewhere within the cerebral white matter, nonspecific but compatible chronic small vessel ischemic disease. Tiny chronic infarct within the right cerebellar hemisphere (series 10, image 6). There is no acute infarct. No evidence of an intracranial mass. No chronic intracranial blood products. No extra-axial fluid collection. No midline shift. Vascular: Maintained flow voids within the proximal large arterial vessels. Skull and upper cervical spine: No focal worrisome marrow lesion. Sinuses/Orbits: No mass or acute finding within the imaged orbits. No significant paranasal sinus disease. IMPRESSION: 1. No evidence of an acute intracranial abnormality. 2. Chronic lacunar infarcts within/about the left basal ganglia. 3. Moderate background cerebral white matter chronic small vessel ischemic disease. 4. Tiny chronic infarct within the right cerebellar hemisphere. Electronically Signed   By: Rockey Childs D.O.   On: 05/07/2024 14:29   CT ANGIO HEAD NECK W WO CM Result Date: 05/06/2024 CLINICAL DATA:  Initial evaluation for amaurosis fugax. Laterality not specified. EXAM: CT ANGIOGRAPHY HEAD AND NECK WITH AND WITHOUT CONTRAST TECHNIQUE: Multidetector CT imaging of the head and neck was performed using the standard protocol during bolus administration of intravenous contrast. Multiplanar CT image reconstructions and MIPs were obtained to evaluate the vascular anatomy. Carotid stenosis measurements (when applicable) are obtained utilizing NASCET criteria, using the distal internal carotid diameter as the denominator. RADIATION DOSE REDUCTION: This exam was performed according to the departmental dose-optimization program which includes automated exposure control, adjustment of the mA and/or kV according to patient size  and/or use of iterative reconstruction technique. CONTRAST:  75mL OMNIPAQUE  IOHEXOL   350 MG/ML SOLN COMPARISON:  Prior study from 01/18/2017 FINDINGS: CT HEAD FINDINGS Brain: Age-related cerebral atrophy with moderate chronic microvascular ischemic disease. Remote lacunar infarcts present about the left basal ganglia. No acute intracranial hemorrhage. No acute large vessel territory infarct. No mass lesion or midline shift. No hydrocephalus or extra-axial fluid collection. Vascular: No asymmetric hyperdense vessel. Calcified atherosclerosis present at the skull base. Skull: No acute finding. Sinuses/Orbits: Globes orbital soft tissues within normal limits. Paranasal sinuses and mastoid air cells are largely clear. Other: None. Review of the MIP images confirms the above findings CTA NECK FINDINGS Aortic arch: Visualized aortic arch within normal limits for caliber. Extensive aortic atherosclerosis with soft plaque protruding into the aortic lumen of the distal arch (series 10, image 270). No visible high-grade stenosis about the origin the great vessels. Right carotid system: Right common and internal carotid arteries are tortuous and ectatic but patent without stenosis or dissection. Left carotid system: Left common and internal carotid arteries are tortuous and ectatic but patent without stenosis or dissection. Vertebral arteries: Left vertebral artery dominant. Vertebral arteries are tortuous but patent without stenosis or dissection. Skeleton: No worrisome osseous lesions. Exaggeration of the normal cervical lordosis with mild multilevel cervical spondylosis. Other neck: No other acute finding.  Prior thyroidectomy. Upper chest: No other acute finding. Review of the MIP images confirms the above findings CTA HEAD FINDINGS Anterior circulation: Atheromatous change about the carotid siphons without hemodynamically significant stenosis. 2-3 mm left paraophthalmic aneurysm (series 10, image 95). Additional 3-4 mm  outpouching extending inferiorly from the supraclinoid right ICA could reflect a vascular infundibulum or possibly additional aneurysm (series 10, image 98). A1 segments patent bilaterally. Normal anterior communicating artery complex. Anterior cerebral arteries patent without significant stenosis. No M1 stenosis or occlusion. Distal MCA branches perfused and symmetric. Posterior circulation: Left vertebral artery strongly dominant and patent without stenosis. Left PICA patent. Right V4 segment patent as well. Right PICA patent at its origin. Mild atheromatous irregularity about the basilar without significant stenosis. Superior cerebral arteries patent bilaterally. Right PCA supplied via the basilar. Fetal type origin of the left PCA. Right PCA somewhat tortuous and irregular with superimposed moderate right P2 stenosis (series 13, image 21). PCAs remain patent to their distal aspects. Venous sinuses: Grossly patent allowing for timing the contrast bolus. Anatomic variants: As above. Review of the MIP images confirms the above findings IMPRESSION: CT HEAD: 1. No acute intracranial abnormality. 2. Age-related cerebral atrophy with moderate chronic microvascular ischemic disease, with remote lacunar infarcts about the left basal ganglia. CTA HEAD AND NECK: 1. Negative CTA for large vessel occlusion or other emergent finding. 2. Moderate right P2 stenosis. 3. 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 4. Additional 3-4 mm outpouching extending inferiorly from the supraclinoid right ICA, which could reflect a vascular infundibulum or possibly additional aneurysm. Attention at follow-up recommended. 5. Extensive aortic atherosclerosis with soft plaque protruding into the aortic lumen of the distal arch. 6. Diffuse tortuosity and ectasia of the major arterial vasculature of the head and neck, suggesting chronic underlying hypertension. Aortic Atherosclerosis (ICD10-I70.0). Electronically Signed   By: Morene Hoard M.D.    On: 05/06/2024 21:24     PERTINENT LAB RESULTS: CBC: Recent Labs    05/06/24 1951 05/07/24 1237  WBC 11.3* 11.2*  HGB 14.7 15.5*  HCT 44.0 45.9  PLT 304 322   CMET CMP     Component Value Date/Time   NA 139 05/07/2024 1237   K 3.6 05/07/2024 1237   CL  104 05/07/2024 1237   CO2 26 05/07/2024 1237   GLUCOSE 133 (H) 05/07/2024 1237   BUN 16 05/07/2024 1237   CREATININE 0.61 05/07/2024 1237   CREATININE 0.58 09/02/2023 1423   CALCIUM 9.0 05/07/2024 1237   PROT 6.8 05/07/2024 1237   ALBUMIN 3.6 05/07/2024 1237   AST 19 05/07/2024 1237   AST 17 09/02/2023 1423   ALT 20 05/07/2024 1237   ALT 10 09/02/2023 1423   ALKPHOS 78 05/07/2024 1237   BILITOT 1.0 05/07/2024 1237   BILITOT 0.6 09/02/2023 1423   GFRNONAA >60 05/07/2024 1237   GFRNONAA >60 09/02/2023 1423    GFR Estimated Creatinine Clearance: 51.5 mL/min (by C-G formula based on SCr of 0.61 mg/dL). No results for input(s): LIPASE, AMYLASE in the last 72 hours. No results for input(s): CKTOTAL, CKMB, CKMBINDEX, TROPONINI in the last 72 hours. Invalid input(s): POCBNP No results for input(s): DDIMER in the last 72 hours. Recent Labs    05/07/24 1236  HGBA1C 5.5   Recent Labs    05/08/24 0524  CHOL 192  HDL 60  LDLCALC 123*  TRIG 45  CHOLHDL 3.2   No results for input(s): TSH, T4TOTAL, T3FREE, THYROIDAB in the last 72 hours.  Invalid input(s): FREET3 No results for input(s): VITAMINB12, FOLATE, FERRITIN, TIBC, IRON , RETICCTPCT in the last 72 hours. Coags: No results for input(s): INR in the last 72 hours.  Invalid input(s): PT Microbiology: No results found for this or any previous visit (from the past 240 hours).  FURTHER DISCHARGE INSTRUCTIONS:  Get Medicines reviewed and adjusted: Please take all your medications with you for your next visit with your Primary MD  Laboratory/radiological data: Please request your Primary MD to go over all hospital  tests and procedure/radiological results at the follow up, please ask your Primary MD to get all Hospital records sent to his/her office.  In some cases, they will be blood work, cultures and biopsy results pending at the time of your discharge. Please request that your primary care M.D. goes through all the records of your hospital data and follows up on these results.  Also Note the following: If you experience worsening of your admission symptoms, develop shortness of breath, life threatening emergency, suicidal or homicidal thoughts you must seek medical attention immediately by calling 911 or calling your MD immediately  if symptoms less severe.  You must read complete instructions/literature along with all the possible adverse reactions/side effects for all the Medicines you take and that have been prescribed to you. Take any new Medicines after you have completely understood and accpet all the possible adverse reactions/side effects.   Do not drive when taking Pain medications or sleeping medications (Benzodaizepines)  Do not take more than prescribed Pain, Sleep and Anxiety Medications. It is not advisable to combine anxiety,sleep and pain medications without talking with your primary care practitioner  Special Instructions: If you have smoked or chewed Tobacco  in the last 2 yrs please stop smoking, stop any regular Alcohol  and or any Recreational drug use.  Wear Seat belts while driving.  Please note: You were cared for by a hospitalist during your hospital stay. Once you are discharged, your primary care physician will handle any further medical issues. Please note that NO REFILLS for any discharge medications will be authorized once you are discharged, as it is imperative that you return to your primary care physician (or establish a relationship with a primary care physician if you do not have one) for your  post hospital discharge needs so that they can reassess your need for  medications and monitor your lab values.  Total Time spent coordinating discharge including counseling, education and face to face time equals greater than 30 minutes.  SignedBETHA Donalda Applebaum 05/08/2024 8:47 AM

## 2024-05-08 NOTE — Plan of Care (Signed)

## 2024-05-08 NOTE — Progress Notes (Signed)
 Awaiting TOC meds to be ready. Ride in room.

## 2024-05-08 NOTE — Plan of Care (Signed)
 Problem: Education: Goal: Knowledge of General Education information will improve Description: Including pain rating scale, medication(s)/side effects and non-pharmacologic comfort measures 05/08/2024 0839 by Johnie Will HERO, RN Outcome: Progressing 05/08/2024 0736 by Johnie Will HERO, RN Outcome: Progressing   Problem: Health Behavior/Discharge Planning: Goal: Ability to manage health-related needs will improve 05/08/2024 0839 by Johnie Will HERO, RN Outcome: Progressing 05/08/2024 0736 by Johnie Will HERO, RN Outcome: Progressing   Problem: Clinical Measurements: Goal: Ability to maintain clinical measurements within normal limits will improve 05/08/2024 0839 by Johnie Will HERO, RN Outcome: Progressing 05/08/2024 0736 by Johnie Will HERO, RN Outcome: Progressing Goal: Will remain free from infection 05/08/2024 0839 by Johnie Will HERO, RN Outcome: Progressing 05/08/2024 0736 by Johnie Will HERO, RN Outcome: Progressing Goal: Diagnostic test results will improve 05/08/2024 0839 by Johnie Will HERO, RN Outcome: Progressing 05/08/2024 0736 by Johnie Will HERO, RN Outcome: Progressing Goal: Respiratory complications will improve 05/08/2024 0839 by Johnie Will HERO, RN Outcome: Progressing 05/08/2024 0736 by Johnie Will HERO, RN Outcome: Progressing Goal: Cardiovascular complication will be avoided 05/08/2024 0839 by Johnie Will HERO, RN Outcome: Progressing 05/08/2024 0736 by Johnie Will HERO, RN Outcome: Progressing   Problem: Activity: Goal: Risk for activity intolerance will decrease 05/08/2024 0839 by Johnie Will HERO, RN Outcome: Progressing 05/08/2024 0736 by Johnie Will HERO, RN Outcome: Progressing   Problem: Nutrition: Goal: Adequate nutrition will be maintained 05/08/2024 0839 by Johnie Will HERO, RN Outcome: Progressing 05/08/2024 0736 by Johnie Will HERO, RN Outcome: Progressing   Problem: Coping: Goal: Level of anxiety will  decrease 05/08/2024 0839 by Johnie Will HERO, RN Outcome: Progressing 05/08/2024 0736 by Johnie Will HERO, RN Outcome: Progressing   Problem: Elimination: Goal: Will not experience complications related to bowel motility 05/08/2024 0839 by Johnie Will HERO, RN Outcome: Progressing 05/08/2024 0736 by Johnie Will HERO, RN Outcome: Progressing Goal: Will not experience complications related to urinary retention 05/08/2024 0839 by Johnie Will HERO, RN Outcome: Progressing 05/08/2024 0736 by Johnie Will HERO, RN Outcome: Progressing   Problem: Pain Managment: Goal: General experience of comfort will improve and/or be controlled 05/08/2024 0839 by Johnie Will HERO, RN Outcome: Progressing 05/08/2024 0736 by Johnie Will HERO, RN Outcome: Progressing   Problem: Safety: Goal: Ability to remain free from injury will improve 05/08/2024 0839 by Johnie Will HERO, RN Outcome: Progressing 05/08/2024 0736 by Johnie Will HERO, RN Outcome: Progressing   Problem: Skin Integrity: Goal: Risk for impaired skin integrity will decrease 05/08/2024 0839 by Johnie Will HERO, RN Outcome: Progressing 05/08/2024 0736 by Johnie Will HERO, RN Outcome: Progressing   Problem: Education: Goal: Knowledge of disease or condition will improve 05/08/2024 0839 by Johnie Will HERO, RN Outcome: Progressing 05/08/2024 0736 by Johnie Will HERO, RN Outcome: Progressing Goal: Knowledge of secondary prevention will improve (MUST DOCUMENT ALL) 05/08/2024 0839 by Johnie Will HERO, RN Outcome: Progressing 05/08/2024 0736 by Johnie Will HERO, RN Outcome: Progressing Goal: Knowledge of patient specific risk factors will improve (DELETE if not current risk factor) 05/08/2024 0839 by Johnie Will HERO, RN Outcome: Progressing 05/08/2024 0736 by Johnie Will HERO, RN Outcome: Progressing   Problem: Ischemic Stroke/TIA Tissue Perfusion: Goal: Complications of ischemic stroke/TIA will be  minimized 05/08/2024 0839 by Johnie Will HERO, RN Outcome: Progressing 05/08/2024 0736 by Johnie Will HERO, RN Outcome: Progressing   Problem: Coping: Goal: Will verbalize positive feelings about self 05/08/2024 0839 by Johnie Will HERO, RN Outcome: Progressing 05/08/2024 0736 by Johnie Will HERO, RN Outcome: Progressing Goal: Will identify appropriate support needs 05/08/2024  [id number] by Johnie Will HERO, RN Outcome: Progressing 05/08/2024 0736 by Johnie Will HERO, RN Outcome: Progressing   Problem: Health Behavior/Discharge Planning: Goal: Ability to manage health-related needs will improve 05/08/2024 0839 by Johnie Will HERO, RN Outcome: Progressing 05/08/2024 0736 by Johnie Will HERO, RN Outcome: Progressing Goal: Goals will be collaboratively established with patient/family 05/08/2024 [phone] by Johnie Will HERO, RN Outcome: Progressing 05/08/2024 0736 by Johnie Will HERO, RN Outcome: Progressing   Problem: Self-Care: Goal: Ability to participate in self-care as condition permits will improve 05/08/2024 0839 by Johnie Will HERO, RN Outcome: Progressing 05/08/2024 0736 by Johnie Will HERO, RN Outcome: Progressing Goal: Verbalization of feelings and concerns over difficulty with self-care will improve 05/08/2024 0839 by Johnie Will HERO, RN Outcome: Progressing 05/08/2024 0736 by Johnie Will HERO, RN Outcome: Progressing Goal: Ability to communicate needs accurately will improve 05/08/2024 0839 by Johnie Will HERO, RN Outcome: Progressing 05/08/2024 0736 by Johnie Will HERO, RN Outcome: Progressing   Problem: Nutrition: Goal: Risk of aspiration will decrease 05/08/2024 0839 by Johnie Will HERO, RN Outcome: Progressing 05/08/2024 0736 by Johnie Will HERO, RN Outcome: Progressing Goal: Dietary intake will improve 05/08/2024 0839 by Johnie Will HERO, RN Outcome: Progressing 05/08/2024 0736 by Johnie Will HERO, RN Outcome: Progressing

## 2024-05-08 NOTE — Plan of Care (Signed)
 Problem: Education: Goal: Knowledge of General Education information will improve Description: Including pain rating scale, medication(s)/side effects and non-pharmacologic comfort measures 05/08/2024 0839 by Johnie Will HERO, RN Outcome: Adequate for Discharge 05/08/2024 [phone] by Johnie Will HERO, RN Outcome: Progressing 05/08/2024 0736 by Johnie Will HERO, RN Outcome: Progressing   Problem: Health Behavior/Discharge Planning: Goal: Ability to manage health-related needs will improve 05/08/2024 0839 by Johnie Will HERO, RN Outcome: Adequate for Discharge 05/08/2024 [phone] by Johnie Will HERO, RN Outcome: Progressing 05/08/2024 0736 by Johnie Will HERO, RN Outcome: Progressing   Problem: Clinical Measurements: Goal: Ability to maintain clinical measurements within normal limits will improve 05/08/2024 0839 by Johnie Will HERO, RN Outcome: Adequate for Discharge 05/08/2024 [phone] by Johnie Will HERO, RN Outcome: Progressing 05/08/2024 0736 by Johnie Will HERO, RN Outcome: Progressing Goal: Will remain free from infection 05/08/2024 0839 by Johnie Will HERO, RN Outcome: Adequate for Discharge 05/08/2024 [phone] by Johnie Will HERO, RN Outcome: Progressing 05/08/2024 0736 by Johnie Will HERO, RN Outcome: Progressing Goal: Diagnostic test results will improve 05/08/2024 0839 by Johnie Will HERO, RN Outcome: Adequate for Discharge 05/08/2024 [phone] by Johnie Will HERO, RN Outcome: Progressing 05/08/2024 0736 by Johnie Will HERO, RN Outcome: Progressing Goal: Respiratory complications will improve 05/08/2024 0839 by Johnie Will HERO, RN Outcome: Adequate for Discharge 05/08/2024 [phone] by Johnie Will HERO, RN Outcome: Progressing 05/08/2024 0736 by Johnie Will HERO, RN Outcome: Progressing Goal: Cardiovascular complication will be avoided 05/08/2024 0839 by Johnie Will HERO, RN Outcome: Adequate for Discharge 05/08/2024 [phone] by Johnie Will HERO, RN Outcome:  Progressing 05/08/2024 0736 by Johnie Will HERO, RN Outcome: Progressing   Problem: Activity: Goal: Risk for activity intolerance will decrease 05/08/2024 0839 by Johnie Will HERO, RN Outcome: Adequate for Discharge 05/08/2024 [phone] by Johnie Will HERO, RN Outcome: Progressing 05/08/2024 0736 by Johnie Will HERO, RN Outcome: Progressing   Problem: Nutrition: Goal: Adequate nutrition will be maintained 05/08/2024 0839 by Johnie Will HERO, RN Outcome: Adequate for Discharge 05/08/2024 [phone] by Johnie Will HERO, RN Outcome: Progressing 05/08/2024 0736 by Johnie Will HERO, RN Outcome: Progressing   Problem: Coping: Goal: Level of anxiety will decrease 05/08/2024 0839 by Johnie Will HERO, RN Outcome: Adequate for Discharge 05/08/2024 [phone] by Johnie Will HERO, RN Outcome: Progressing 05/08/2024 0736 by Johnie Will HERO, RN Outcome: Progressing   Problem: Elimination: Goal: Will not experience complications related to bowel motility 05/08/2024 0839 by Johnie Will HERO, RN Outcome: Adequate for Discharge 05/08/2024 [phone] by Johnie Will HERO, RN Outcome: Progressing 05/08/2024 0736 by Johnie Will HERO, RN Outcome: Progressing Goal: Will not experience complications related to urinary retention 05/08/2024 0839 by Johnie Will HERO, RN Outcome: Adequate for Discharge 05/08/2024 [phone] by Johnie Will HERO, RN Outcome: Progressing 05/08/2024 0736 by Johnie Will HERO, RN Outcome: Progressing   Problem: Pain Managment: Goal: General experience of comfort will improve and/or be controlled 05/08/2024 0839 by Johnie Will HERO, RN Outcome: Adequate for Discharge 05/08/2024 [phone] by Johnie Will HERO, RN Outcome: Progressing 05/08/2024 0736 by Johnie Will HERO, RN Outcome: Progressing   Problem: Safety: Goal: Ability to remain free from injury will improve 05/08/2024 0839 by Johnie Will HERO, RN Outcome: Adequate for Discharge 05/08/2024 [phone] by Johnie Will HERO,  RN Outcome: Progressing 05/08/2024 0736 by Johnie Will HERO, RN Outcome: Progressing   Problem: Skin Integrity: Goal: Risk for impaired skin integrity will decrease 05/08/2024 0839 by Johnie Will HERO, RN Outcome: Adequate for Discharge 05/08/2024 [phone] by Johnie Will HERO, RN Outcome: Progressing 05/08/2024 0736 by Johnie  Will HERO, RN Outcome: Progressing   Problem: Education: Goal: Knowledge of disease or condition will improve 05/08/2024 0839 by Johnie Will HERO, RN Outcome: Adequate for Discharge 05/08/2024 [phone] by Johnie Will HERO, RN Outcome: Progressing 05/08/2024 0736 by Johnie Will HERO, RN Outcome: Progressing Goal: Knowledge of secondary prevention will improve (MUST DOCUMENT ALL) 05/08/2024 0839 by Johnie Will HERO, RN Outcome: Adequate for Discharge 05/08/2024 [phone] by Johnie Will HERO, RN Outcome: Progressing 05/08/2024 0736 by Johnie Will HERO, RN Outcome: Progressing Goal: Knowledge of patient specific risk factors will improve (DELETE if not current risk factor) 05/08/2024 0839 by Johnie Will HERO, RN Outcome: Adequate for Discharge 05/08/2024 [phone] by Johnie Will HERO, RN Outcome: Progressing 05/08/2024 0736 by Johnie Will HERO, RN Outcome: Progressing   Problem: Ischemic Stroke/TIA Tissue Perfusion: Goal: Complications of ischemic stroke/TIA will be minimized 05/08/2024 0839 by Johnie Will HERO, RN Outcome: Adequate for Discharge 05/08/2024 [phone] by Johnie Will HERO, RN Outcome: Progressing 05/08/2024 0736 by Johnie Will HERO, RN Outcome: Progressing   Problem: Coping: Goal: Will verbalize positive feelings about self 05/08/2024 0839 by Johnie Will HERO, RN Outcome: Adequate for Discharge 05/08/2024 [phone] by Johnie Will HERO, RN Outcome: Progressing 05/08/2024 0736 by Johnie Will HERO, RN Outcome: Progressing Goal: Will identify appropriate support needs 05/08/2024 0839 by Johnie Will HERO, RN Outcome: Adequate for  Discharge 05/08/2024 [phone] by Johnie Will HERO, RN Outcome: Progressing 05/08/2024 0736 by Johnie Will HERO, RN Outcome: Progressing   Problem: Health Behavior/Discharge Planning: Goal: Ability to manage health-related needs will improve 05/08/2024 0839 by Johnie Will HERO, RN Outcome: Adequate for Discharge 05/08/2024 [phone] by Johnie Will HERO, RN Outcome: Progressing 05/08/2024 0736 by Johnie Will HERO, RN Outcome: Progressing Goal: Goals will be collaboratively established with patient/family 05/08/2024 [phone] by Johnie Will HERO, RN Outcome: Adequate for Discharge 05/08/2024 [phone] by Johnie Will HERO, RN Outcome: Progressing 05/08/2024 0736 by Johnie Will HERO, RN Outcome: Progressing   Problem: Self-Care: Goal: Ability to participate in self-care as condition permits will improve 05/08/2024 0839 by Johnie Will HERO, RN Outcome: Adequate for Discharge 05/08/2024 [phone] by Johnie Will HERO, RN Outcome: Progressing 05/08/2024 0736 by Johnie Will HERO, RN Outcome: Progressing Goal: Verbalization of feelings and concerns over difficulty with self-care will improve 05/08/2024 0839 by Johnie Will HERO, RN Outcome: Adequate for Discharge 05/08/2024 [phone] by Johnie Will HERO, RN Outcome: Progressing 05/08/2024 0736 by Johnie Will HERO, RN Outcome: Progressing Goal: Ability to communicate needs accurately will improve 05/08/2024 0839 by Johnie Will HERO, RN Outcome: Adequate for Discharge 05/08/2024 [phone] by Johnie Will HERO, RN Outcome: Progressing 05/08/2024 0736 by Johnie Will HERO, RN Outcome: Progressing   Problem: Nutrition: Goal: Risk of aspiration will decrease 05/08/2024 0839 by Johnie Will HERO, RN Outcome: Adequate for Discharge 05/08/2024 [phone] by Johnie Will HERO, RN Outcome: Progressing 05/08/2024 0736 by Johnie Will HERO, RN Outcome: Progressing Goal: Dietary intake will improve 05/08/2024 0839 by Johnie Will HERO, RN Outcome: Adequate for  Discharge 05/08/2024 [phone] by Johnie Will HERO, RN Outcome: Progressing 05/08/2024 0736 by Johnie Will HERO, RN Outcome: Progressing

## 2024-05-08 NOTE — Plan of Care (Signed)
 Pt discharged before seen. But I discussed with Dr. Kellie that pt symptoms could be ocular migraine given significant hx of migraine or TIA given risk factors. GCA less likely without HA and normal ESR. Agree with DAPT for 3 weeks and than ASA alone. Pt allergic to statin and zetia. Recommend PCSK9 inhibitor or leqvio, but per Dr. Kellie pt has declined both. Will follow up with PCP. She will continue to follow up with [facility].   Ary Cummins, MD PhD Stroke Neurology 05/08/2024 1:32 PM

## 2024-05-25 ENCOUNTER — Inpatient Hospital Stay: Attending: Hematology and Oncology | Admitting: Medical Oncology

## 2024-05-25 ENCOUNTER — Inpatient Hospital Stay

## 2024-05-25 ENCOUNTER — Encounter: Payer: Self-pay | Admitting: Medical Oncology

## 2024-05-25 VITALS — BP 177/86 | HR 80 | Temp 98.3°F | Resp 17 | Wt 163.8 lb

## 2024-05-25 DIAGNOSIS — I10 Essential (primary) hypertension: Secondary | ICD-10-CM | POA: Diagnosis not present

## 2024-05-25 DIAGNOSIS — E611 Iron deficiency: Secondary | ICD-10-CM

## 2024-05-25 DIAGNOSIS — D509 Iron deficiency anemia, unspecified: Secondary | ICD-10-CM | POA: Diagnosis present

## 2024-05-25 DIAGNOSIS — R030 Elevated blood-pressure reading, without diagnosis of hypertension: Secondary | ICD-10-CM | POA: Diagnosis not present

## 2024-05-25 DIAGNOSIS — Z85850 Personal history of malignant neoplasm of thyroid: Secondary | ICD-10-CM | POA: Insufficient documentation

## 2024-05-25 DIAGNOSIS — E639 Nutritional deficiency, unspecified: Secondary | ICD-10-CM | POA: Diagnosis not present

## 2024-05-25 NOTE — Progress Notes (Signed)
 Hematology and Oncology Follow Up Visit  [name] [id number] Mar 19, 1941 83 y.o. 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 Formerly seen at [facility] by Dr. Gudena. She is here with her husband.   She has a longstanding history of low ferritin levels. Most recent iron  history shown below: 02/06/23: Ferritin 6.2, TIBC 432 02/25/2023: Hemoglobin 12.5, MCV 85.8, TIBC 345, iron  saturation 15%, ferritin 15 06/14/2023: Hemoglobin 13.7, MCV 86.8, iron  saturation 20%, ferritin 32 09/02/2023: Hgb 14.1, iron  sat 21%, ferritin 18 12/27/2023: Hgb 15.1, iron  sat 49%, ferritin 104  Her most recent iron  infusion was IV Venofer  given on 10/08/2023 and 10/15/2023.    Has been off of iron  supplement for the past few days due to constipation.   She reports that in September she had an endoscopy which showed a medium hiatal hernia. Colonoscopy of 2023 was normal with diverticula. I suggested a possible Capsule study at a previous visit. She has also recently had an Ifob test which was negative.   Today she states that she has been fair. She is working with her GYN team regarding an elevated estrogen level. She has seen GYN[facility]ON[facility] in [place] for this and had a transvaginal US , abdominal CT and CTA. No tumor has been visualized.   There has been no bleeding to her knowledge: denies epistaxis, gingivitis, hemoptysis, hematemesis, hematuria, melena, excessive bruising, blood donation.  She has had a sleep study - [facility]) 2023-which did not show any significant apnea.   No SOB, chest pain or pica. No abdominal pains.   Wt Readings from Last 3 Encounters:   05/25/24 163 lb 12.8 oz (74.3 kg)  05/07/24 166 lb 3.6 oz (75.4 kg)  02/24/24 166 lb 1.9 oz (75.4 kg)     Medications:   Current Outpatient Medications:    aspirin  EC 81 MG tablet, Take 1 tablet (81 mg total) by mouth daily. Swallow whole., Disp: 30 tablet, Rfl: 12   Cholecalciferol (VITAMIN D3) 50 MCG (2000 UT) capsule, Take 2 capsules by mouth daily. (Patient taking differently: Take 2,000 Units by mouth daily.), Disp: , Rfl:    clopidogrel  (PLAVIX ) 75 MG tablet, Take 1 tablet (75 mg total) by mouth daily., Disp: 21 tablet, Rfl: 0   diphenhydramine-acetaminophen  (TYLENOL  PM) 25-500 MG TABS tablet, Take 1 tablet by mouth at bedtime as needed., Disp: , Rfl:    ergotamine-caffeine (CAFERGOT) 1-100 MG tablet, Take 2 tablets by mouth every hour as needed for headache or migraine. Two tablets at onset of attack; then 1 tablet every 30 minutes as needed; maximum: 6 tablets per attack; do not exceed 10 tablets/week, Disp: , Rfl:    Multiple Vitamin (MULTI-VITAMIN) tablet, Take 1 tablet by mouth daily., Disp: , Rfl:    TIROSINT 125 MCG CAPS, Take 125 mcg by mouth daily. , Disp: , Rfl:   Current Facility-Administered Medications:    Erenumab -aooe SOAJ 140 mg, 140 mg, Subcutaneous, Once, [name], MD  Allergies:  Allergies  Allergen Reactions   Paxil [Paroxetine Hcl] Other (See Comments)    syncope   Celecoxib Swelling and Other (See Comments)  Swelling of ankles and feet for two weeks   Ezetimibe Other (See Comments)    Other Reaction(s): muscle pain   Propranolol Hcl Other (See Comments)    Other Reaction(s): Bloated   Atorvastatin Other (See Comments)    Other reaction(s): Myalgias (muscle pain)    Liothyronine Nausea And Vomiting    Other Reaction(s): upset stomach   Progesterone  Swelling and Other (See Comments)    Swelling in feet    Rosuvastatin Calcium Other (See Comments)    Muscle pain   Statins Other (See Comments)    Muscle pain   Sulfamethoxazole Nausea And  Vomiting   Zonisamide Nausea And Vomiting and Other (See Comments)    BM turns white     Past Medical History, Surgical history, Social history, and Family History were reviewed and updated.  Review of Systems: As stated above in HPI   Physical Exam:  weight is 163 lb 12.8 oz (74.3 kg). Her oral temperature is 98.3 F (36.8 C). Her blood pressure is 177/86 (abnormal) and her pulse is 80. Her respiration is 17 and oxygen saturation is 99%.   Physical Exam General: NAD Cardiovascular: regular rate and rhythm Pulmonary: clear ant fields Abdomen: soft, nontender, + bowel sounds GU: no suprapubic tenderness Extremities: no edema, no joint deformities Skin: no rashes Neurological: Weakness but otherwise nonfocal   Lab Results  Component Value Date   WBC 11.2 (H) 05/07/2024   HGB 15.5 (H) 05/07/2024   HCT 45.9 05/07/2024   MCV 89.3 05/07/2024   PLT 322 05/07/2024     Chemistry      Component Value Date/Time   NA 139 05/07/2024 1237   K 3.6 05/07/2024 1237   CL 104 05/07/2024 1237   CO2 26 05/07/2024 1237   BUN 16 05/07/2024 1237   CREATININE 0.61 05/07/2024 1237   CREATININE 0.58 09/02/2023 1423      Component Value Date/Time   CALCIUM 9.0 05/07/2024 1237   ALKPHOS 78 05/07/2024 1237   AST 19 05/07/2024 1237   AST 17 09/02/2023 1423   ALT 20 05/07/2024 1237   ALT 10 09/02/2023 1423   BILITOT 1.0 05/07/2024 1237   BILITOT 0.6 09/02/2023 1423     Encounter Diagnoses  Name Primary?   Iron  deficiency Yes   Nutritional deficiency    White coat syndrome without hypertension     Assessment and Plan- Patient is a 83 y.o. female with IDA thought to be secondary to malabsorption. She has had a recent endoscopy and colonoscopy. She has not yet had a capsule study.   She brings in labs which were obtained recently at an outside lab. Labs obtained on 05/18/2024 and show a Hgb of 14.9, ferritin of 60.8. Platelets were 261, MCV was 87.5.   Iron  studies pending.  She has  white coat hypertension. Home readings are around 120/80  Disposition: RTC 2 months APP, labs (CBC, iron , ferritin, retic)-[place]    Lauraine Dais PA-C 7/24/202512:14 PM

## 2024-07-17 ENCOUNTER — Inpatient Hospital Stay: Admitting: Neurology

## 2024-07-17 ENCOUNTER — Telehealth: Payer: Self-pay | Admitting: Neurology

## 2024-07-17 NOTE — Telephone Encounter (Signed)
 Patient rescheduled appointment.

## 2024-07-17 NOTE — Telephone Encounter (Signed)
 [facility] cancellation

## 2024-07-25 ENCOUNTER — Other Ambulatory Visit: Payer: Self-pay | Admitting: Medical Oncology

## 2024-07-25 DIAGNOSIS — E639 Nutritional deficiency, unspecified: Secondary | ICD-10-CM

## 2024-07-25 DIAGNOSIS — Z862 Personal history of diseases of the blood and blood-forming organs and certain disorders involving the immune mechanism: Secondary | ICD-10-CM

## 2024-07-25 DIAGNOSIS — E611 Iron deficiency: Secondary | ICD-10-CM

## 2024-07-26 ENCOUNTER — Inpatient Hospital Stay: Attending: Hematology and Oncology

## 2024-07-26 ENCOUNTER — Inpatient Hospital Stay (HOSPITAL_BASED_OUTPATIENT_CLINIC_OR_DEPARTMENT_OTHER): Admitting: Medical Oncology

## 2024-07-26 ENCOUNTER — Encounter: Payer: Self-pay | Admitting: Medical Oncology

## 2024-07-26 VITALS — BP 165/98 | HR 80 | Temp 98.2°F | Resp 18 | Wt 166.4 lb

## 2024-07-26 DIAGNOSIS — I10 Essential (primary) hypertension: Secondary | ICD-10-CM | POA: Diagnosis not present

## 2024-07-26 DIAGNOSIS — Z862 Personal history of diseases of the blood and blood-forming organs and certain disorders involving the immune mechanism: Secondary | ICD-10-CM

## 2024-07-26 DIAGNOSIS — E280 Estrogen excess: Secondary | ICD-10-CM | POA: Diagnosis not present

## 2024-07-26 DIAGNOSIS — E611 Iron deficiency: Secondary | ICD-10-CM | POA: Diagnosis not present

## 2024-07-26 DIAGNOSIS — D509 Iron deficiency anemia, unspecified: Secondary | ICD-10-CM | POA: Insufficient documentation

## 2024-07-26 DIAGNOSIS — E639 Nutritional deficiency, unspecified: Secondary | ICD-10-CM | POA: Insufficient documentation

## 2024-07-26 DIAGNOSIS — Z85850 Personal history of malignant neoplasm of thyroid: Secondary | ICD-10-CM | POA: Diagnosis not present

## 2024-07-26 LAB — RETIC PANEL
Immature Retic Fract: 8.3 % (ref 2.3–15.9)
RBC.: 4.83 MIL/uL (ref 3.87–5.11)
Retic Count, Absolute: 83.1 K/uL (ref 19.0–186.0)
Retic Ct Pct: 1.7 % (ref 0.4–3.1)
Reticulocyte Hemoglobin: 33.7 pg (ref >27.9)

## 2024-07-26 LAB — CBC
HCT: 43.5 % (ref 36.0–46.0)
Hemoglobin: 14.3 g/dL (ref 12.0–15.0)
MCH: 29.4 pg (ref 26.0–34.0)
MCHC: 32.9 g/dL (ref 30.0–36.0)
MCV: 89.5 fL (ref 80.0–100.0)
Platelets: 293 K/uL (ref 150–400)
RBC: 4.86 MIL/uL (ref 3.87–5.11)
RDW: 13.2 % (ref 11.5–15.5)
WBC: 6.5 K/uL (ref 4.0–10.5)
nRBC: 0 % (ref 0.0–0.2)

## 2024-07-26 LAB — VITAMIN B12: Vitamin B-12: 482 pg/mL (ref 180–914)

## 2024-07-26 LAB — FOLATE: Folate: >20 ng/mL (ref >5.9)

## 2024-07-26 LAB — IRON AND IRON BINDING CAPACITY (CC-WL,HP ONLY)
Iron: 100 ug/dL (ref 28–170)
Saturation Ratios: 31 % (ref 10.4–31.8)
TIBC: 319 ug/dL (ref 250–450)
UIBC: 219 ug/dL

## 2024-07-26 LAB — FERRITIN: Ferritin: 55 ng/mL (ref 11–307)

## 2024-07-26 NOTE — Progress Notes (Signed)
 Hematology and Oncology Follow Up Visit  [name] [id number] 11-09-1940 83 y.o. 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 Formerly seen at [facility] by Dr. Gudena. She is here with her husband.   She has a longstanding history of low ferritin levels. Most recent iron  history shown below: 02/06/23: Ferritin 6.2, TIBC 432 02/25/2023: Hemoglobin 12.5, MCV 85.8, TIBC 345, iron  saturation 15%, ferritin 15 06/14/2023: Hemoglobin 13.7, MCV 86.8, iron  saturation 20%, ferritin 32 09/02/2023: Hgb 14.1, iron  sat 21%, ferritin 18 12/27/2023: Hgb 15.1, iron  sat 49%, ferritin 104  Her most recent iron  infusion was IV Venofer  given on 10/08/2023 and 10/15/2023.    Today she states that she is doing well aside from issues with elevated estrogen levels. She has seen [facility] in [place] for this and had a transvaginal US , abdominal CT and CTA. No tumor has been visualized. She is seeing endocrinology Dr. Braulio on October 9th to get a third opinion. She also has chronic headaches for which she is seen by GI.   September 2024 she had an endoscopy which showed a medium hiatal hernia. Colonoscopy of 2023 was normal with diverticula. I suggested a possible Capsule study at a previous visit. She has also recently had an Ifob test which was negative.   There has been no bleeding to her knowledge: denies epistaxis, gingivitis, hemoptysis, hematemesis, hematuria, melena, excessive bruising, blood donation.  She has had a sleep study - [facility]) 2023-which did not show any significant apnea.   No SOB, chest pain or pica. No abdominal pains.   Wt Readings from  Last 3 Encounters:  07/26/24 166 lb 6.4 oz (75.5 kg)  05/25/24 163 lb 12.8 oz (74.3 kg)  05/07/24 166 lb 3.6 oz (75.4 kg)     Medications:   Current Outpatient Medications:    aspirin  EC 81 MG tablet, Take 1 tablet (81 mg total) by mouth daily. Swallow whole., Disp: 30 tablet, Rfl: 12   diphenhydramine-acetaminophen  (TYLENOL  PM) 25-500 MG TABS tablet, Take 1 tablet by mouth at bedtime as needed., Disp: , Rfl:    ergotamine-caffeine (CAFERGOT) 1-100 MG tablet, Take 2 tablets by mouth every hour as needed for headache or migraine. Two tablets at onset of attack; then 1 tablet every 30 minutes as needed; maximum: 6 tablets per attack; do not exceed 10 tablets/week, Disp: , Rfl:    ibuprofen (ADVIL) 200 MG tablet, Take 200 mg by mouth every 6 (six) hours as needed., Disp: , Rfl:    Prenatal Vit-Fe Fumarate-FA (PRENATAL MULTIVITAMIN) TABS tablet, Take 1 tablet by mouth daily at 12 noon., Disp: , Rfl:    TIROSINT 125 MCG CAPS, Take 125 mcg by mouth daily. , Disp: , Rfl:    Cholecalciferol (VITAMIN D3) 50 MCG (2000 UT) capsule, Take 2 capsules by mouth daily. (Patient not taking: Reported on 07/26/2024), Disp: , Rfl:    Multiple Vitamin (MULTI-VITAMIN) tablet, Take 1 tablet by mouth daily. (Patient not taking: Reported on 07/26/2024), Disp: , Rfl:   Current Facility-Administered Medications:    Erenumab -aooe SOAJ 140 mg, 140 mg, Subcutaneous, Once, Ines Paul B,  MD  Allergies:  Allergies  Allergen Reactions   Paxil [Paroxetine Hcl] Other (See Comments)    syncope   Celecoxib Swelling and Other (See Comments)    Swelling of ankles and feet for two weeks   Ezetimibe Other (See Comments)    Other Reaction(s): muscle pain   Propranolol Hcl Other (See Comments)    Other Reaction(s): Bloated   Atorvastatin Other (See Comments)    Other reaction(s): Myalgias (muscle pain)    Liothyronine Nausea And Vomiting    Other Reaction(s): upset stomach   Progesterone  Swelling and Other (See Comments)     Swelling in feet    Rosuvastatin Calcium Other (See Comments)    Muscle pain   Statins Other (See Comments)    Muscle pain   Sulfamethoxazole Nausea And Vomiting   Zonisamide Nausea And Vomiting and Other (See Comments)    BM turns white     Past Medical History, Surgical history, Social history, and Family History were reviewed and updated.  Review of Systems: As stated above in HPI   Physical Exam:  weight is 166 lb 6.4 oz (75.5 kg). Her oral temperature is 98.2 F (36.8 C). Her blood pressure is 165/98 (abnormal) and her pulse is 80. Her respiration is 18 and oxygen saturation is 98%.   Physical Exam Vitals and nursing note reviewed.  Constitutional:      General: She is not in acute distress.    Appearance: Normal appearance. She is not ill-appearing, toxic-appearing or diaphoretic.  HENT:     Mouth/Throat:     Mouth: Mucous membranes are moist.  Cardiovascular:     Rate and Rhythm: Normal rate and regular rhythm.     Heart sounds: Normal heart sounds.  Pulmonary:     Effort: Pulmonary effort is normal.     Breath sounds: Normal breath sounds.  Abdominal:     Palpations: Abdomen is soft.  Skin:    General: Skin is warm.     Coloration: Skin is not pale.  Neurological:     Mental Status: She is alert.      Lab Results  Component Value Date   WBC 6.5 07/26/2024   HGB 14.3 07/26/2024   HCT 43.5 07/26/2024   MCV 89.5 07/26/2024   PLT 293 07/26/2024     Chemistry      Component Value Date/Time   NA 139 05/07/2024 1237   K 3.6 05/07/2024 1237   CL 104 05/07/2024 1237   CO2 26 05/07/2024 1237   BUN 16 05/07/2024 1237   CREATININE 0.61 05/07/2024 1237   CREATININE 0.58 09/02/2023 1423      Component Value Date/Time   CALCIUM 9.0 05/07/2024 1237   ALKPHOS 78 05/07/2024 1237   AST 19 05/07/2024 1237   AST 17 09/02/2023 1423   ALT 20 05/07/2024 1237   ALT 10 09/02/2023 1423   BILITOT 1.0 05/07/2024 1237   BILITOT 0.6 09/02/2023 1423      Encounter Diagnoses  Name Primary?   Iron  deficiency Yes   Nutritional deficiency     Assessment and Plan- Patient is a 83 y.o. female with IDA thought to be secondary to malabsorption. She has had a recent endoscopy and colonoscopy. She has not yet had a capsule study.   Labs show a Hgb of 14.3. Platelets were 293, MCV was 89.5  Iron  studies pending.  She has white coat hypertension. Home readings are around 120/80  Disposition: RTC 3 months APP, labs (CBC, iron , ferritin, retic, B12)-[place]  Lauraine Dais PA-C 9/24/202512:20 PM

## 2024-07-27 ENCOUNTER — Ambulatory Visit: Payer: Self-pay | Admitting: Medical Oncology

## 2024-09-06 ENCOUNTER — Inpatient Hospital Stay: Admitting: Neurology

## 2024-09-12 DIAGNOSIS — R0989 Other specified symptoms and signs involving the circulatory and respiratory systems: Secondary | ICD-10-CM | POA: Insufficient documentation

## 2024-09-12 DIAGNOSIS — Z8673 Personal history of transient ischemic attack (TIA), and cerebral infarction without residual deficits: Secondary | ICD-10-CM | POA: Insufficient documentation

## 2024-09-24 ENCOUNTER — Emergency Department (HOSPITAL_BASED_OUTPATIENT_CLINIC_OR_DEPARTMENT_OTHER)

## 2024-09-24 ENCOUNTER — Encounter (HOSPITAL_BASED_OUTPATIENT_CLINIC_OR_DEPARTMENT_OTHER): Payer: Self-pay

## 2024-09-24 ENCOUNTER — Other Ambulatory Visit: Payer: Self-pay

## 2024-09-24 ENCOUNTER — Emergency Department (HOSPITAL_BASED_OUTPATIENT_CLINIC_OR_DEPARTMENT_OTHER)
Admission: EM | Admit: 2024-09-24 | Discharge: 2024-09-24 | Disposition: A | Discharge status: Home / Self Care | Attending: Emergency Medicine | Admitting: Emergency Medicine

## 2024-09-24 DIAGNOSIS — I10 Essential (primary) hypertension: Secondary | ICD-10-CM | POA: Diagnosis not present

## 2024-09-24 DIAGNOSIS — R051 Acute cough: Secondary | ICD-10-CM | POA: Insufficient documentation

## 2024-09-24 DIAGNOSIS — Z792 Long term (current) use of antibiotics: Secondary | ICD-10-CM | POA: Insufficient documentation

## 2024-09-24 DIAGNOSIS — Z85850 Personal history of malignant neoplasm of thyroid: Secondary | ICD-10-CM | POA: Diagnosis not present

## 2024-09-24 DIAGNOSIS — Z8673 Personal history of transient ischemic attack (TIA), and cerebral infarction without residual deficits: Secondary | ICD-10-CM | POA: Diagnosis not present

## 2024-09-24 DIAGNOSIS — R000 Tachycardia, unspecified: Secondary | ICD-10-CM | POA: Insufficient documentation

## 2024-09-24 DIAGNOSIS — Z79899 Other long term (current) drug therapy: Secondary | ICD-10-CM | POA: Diagnosis not present

## 2024-09-24 MED ORDER — BENZONATATE 100 MG PO CAPS: 100.0000 mg | ORAL_CAPSULE | Freq: Three times a day (TID) | ORAL | 0 refills | Status: DC

## 2024-09-24 MED ORDER — IOHEXOL 350 MG/ML SOLN
75.0000 mL | Freq: Once | INTRAVENOUS | Status: AC | PRN
Start: 2024-09-24 — End: 2024-09-24
  Administered 2024-09-24: 75 mL via INTRAVENOUS

## 2024-09-24 MED ORDER — HYDROCOD POLI-CHLORPHE POLI ER 10-8 MG/5ML PO SUER: 5.0000 mL | Freq: Every evening | ORAL | 0 refills | Status: DC | PRN

## 2024-09-24 NOTE — Discharge Instructions (Addendum)
 The Tessalon  medication is a nonnarcotic, nonsedating medication that you can take up to every 8 hours for cough.  More effective for when you are getting close to bedtime you can use the Tussionex, but please note that this does contain some hydrocodone which can be sedating, I recommend only taking at night when you are getting prepared for sleep.  Recommend drinking plenty of fluids since you had a borderline elevated heart rate and following up closely with your primary care doctor to ensure that your symptoms are resolving.  Your workup today was reassuring, and showed no evidence of a blood clot.

## 2024-09-24 NOTE — ED Triage Notes (Signed)
 Pt reports non-productive cough x1 week. Pt went to UC and sent to ED to r/o PE.

## 2024-09-24 NOTE — ED Provider Notes (Signed)
 [place] EMERGENCY DEPARTMENT AT [facility] Provider Note   CSN: [id number] Arrival date & time: 09/24/24  1618     Patient presents with: Cough   [name] is a 83 y.o. female past medical history significant for hypertension, previous thyroid  cancer, obstructive sleep apnea, obesity, TIA who presents with concern for nonproductive cough for 1 week.  Sent to urgent care, she reports negative home COVID test, no fever at home, she had a positive D-dimer and was sent to the ER to rule out PE.  Denies any chest pain.  History of atrial tachycardia.    Cough      Prior to Admission medications   Medication Sig Start Date End Date Taking? Authorizing Provider  benzonatate  (TESSALON ) 100 MG capsule Take 1 capsule (100 mg total) by mouth every 8 (eight) hours. 09/24/24  Yes Aryn Kops H, PA-C  chlorpheniramine-HYDROcodone (TUSSIONEX) 10-8 MG/5ML Take 5 mLs by mouth at bedtime as needed for cough. 09/24/24  Yes Kandie Keiper H, PA-C  aspirin  EC 81 MG tablet Take 1 tablet (81 mg total) by mouth daily. Swallow whole. 05/09/24   Ghimire, Donalda HERO, MD  diphenhydramine-acetaminophen  (TYLENOL  PM) 25-500 MG TABS tablet Take 1 tablet by mouth at bedtime as needed.    [provider]  ergotamine-caffeine (CAFERGOT) 1-100 MG tablet Take 2 tablets by mouth every hour as needed for headache or migraine. Two tablets at onset of attack; then 1 tablet every 30 minutes as needed; maximum: 6 tablets per attack; do not exceed 10 tablets/week    [provider]  ibuprofen (ADVIL) 200 MG tablet Take 200 mg by mouth every 6 (six) hours as needed.    [provider]  Prenatal Vit-Fe Fumarate-FA (PRENATAL MULTIVITAMIN) TABS tablet Take 1 tablet by mouth daily at 12 noon.    [provider]  TIROSINT 125 MCG CAPS Take 125 mcg by mouth daily.  05/31/19   [provider]    Allergies: Paxil [paroxetine hcl], Celecoxib, Ezetimibe, Propranolol  hcl, Atorvastatin, Liothyronine, Progesterone , Rosuvastatin calcium, Statins, Sulfamethoxazole, and Zonisamide    Review of Systems  Respiratory:  Positive for cough.   All other systems reviewed and are negative.   Updated Vital Signs BP (!) 135/96   Pulse 99   Temp 97.8 F (36.6 C) (Oral)   Resp (!) 24   Ht 5' (1.524 m)   Wt 72.6 kg   SpO2 96%   BMI 31.25 kg/m   Physical Exam Vitals and nursing note reviewed.  Constitutional:      General: She is not in acute distress.    Appearance: Normal appearance.  HENT:     Head: Normocephalic and atraumatic.  Eyes:     General:        Right eye: No discharge.        Left eye: No discharge.  Cardiovascular:     Rate and Rhythm: Regular rhythm. Tachycardia present.     Heart sounds: No murmur heard.    No friction rub. No gallop.  Pulmonary:     Effort: Pulmonary effort is normal.     Breath sounds: Normal breath sounds.     Comments: No wheezing, rhonchi, stridor, rales Abdominal:     General: Bowel sounds are normal.     Palpations: Abdomen is soft.  Skin:    General: Skin is warm and dry.     Capillary Refill: Capillary refill takes less than 2 seconds.  Neurological:     Mental Status: She  is alert and oriented to person, place, and time.  Psychiatric:        Mood and Affect: Mood normal.        Behavior: Behavior normal.     (all labs ordered are listed, but only abnormal results are displayed) Labs Reviewed - No data to display   EKG: EKG Interpretation Date/Time:  Sunday September 24 2024 17:06:06 EST Ventricular Rate:  101 PR Interval:  203 QRS Duration:  86 QT Interval:  387 QTC Calculation: 502 R Axis:   -42  Text Interpretation: Sinus tachycardia Inferior infarct, old Consider anterior infarct Lateral leads are also involved Prolonged QT interval New prolonged QT Confirmed by Zackowski, Scott [phone]) on 09/24/2024 5:23:36 PM  Radiology: CT Angio Chest PE W and/or Wo Contrast Result Date:  09/24/2024 EXAM: CTA of the Chest with contrast for PE 09/24/2024 05:16:02 PM TECHNIQUE: CTA of the chest was performed after the administration of 75 mL of iohexol  (OMNIPAQUE ) 350 MG/ML injection. Multiplanar reformatted images are provided for review. MIP images are provided for review. Automated exposure control, iterative reconstruction, and/or weight based adjustment of the mA/kV was utilized to reduce the radiation dose to as low as reasonably achievable. COMPARISON: None available. CLINICAL HISTORY: Pulmonary embolism (PE) suspected, high prob. FINDINGS: PULMONARY ARTERIES: Pulmonary arteries are adequately opacified for evaluation. No pulmonary embolism. Main pulmonary artery is normal in caliber. MEDIASTINUM: Cardiomegaly. Calcified and noncalcified plaque throughout the aorta. LYMPH NODES: No mediastinal, hilar or axillary lymphadenopathy. LUNGS AND PLEURA: The lungs are without acute process. No focal consolidation or pulmonary edema. No pleural effusion or pneumothorax. UPPER ABDOMEN: Large hiatal hernia. SOFT TISSUES AND BONES: No acute bone or soft tissue abnormality. IMPRESSION: 1. No pulmonary embolism. 2. Cardiomegaly. 3. Large hiatal hernia. 4. Aortic atherosclerosis. Electronically signed by: Franky Crease MD 09/24/2024 05:27 PM EST RP Workstation: [id number]     Procedures   Medications Ordered in the ED  iohexol  (OMNIPAQUE ) 350 MG/ML injection 75 mL (75 mLs Intravenous Contrast Given 09/24/24 1710)                                    Medical Decision Making Amount and/or Complexity of Data Reviewed Labs: ordered. Radiology: ordered.   This patient is a 83 y.o. female  who presents to the ED for concern of cough, tachychycardia, shob.   Differential diagnoses prior to evaluation: The emergent differential diagnosis includes, but is not limited to,  asthma exacerbation, COPD exacerbation, acute upper respiratory infection, acute bronchitis, chronic bronchitis, interstitial lung  disease, ARDS, PE, pneumonia, atypical ACS, carbon monoxide poisoning, spontaneous pneumothorax, new CHF vs CHF exacerbation, versus other --primary concern after urgent care evaluation was to rule out PE given positive D-dimer. This is not an exhaustive differential.   Past Medical History / Co-morbidities / Social History: hypertension, previous thyroid  cancer, obstructive sleep apnea, obesity, TIA  Additional history: Chart reviewed. Pertinent results include: Reviewed lab work, imaging from urgent care visit prior to arrival, overall unremarkable chest x-ray, CBC without leukocytosis, positive D-dimer  Physical Exam: Physical exam performed. The pertinent findings include: Initially somewhat tachycardic, pulse of 114, pulse improved on recheck, 97.  Borderline tachypnea, respirations 18 arrival, 24 on recheck, oxygen saturation is stable.  Some hypertension, blood pressure 150/83, 145/80 at time of discharge.  Lab Tests/Imaging studies: I personally interpreted labs/imaging and the pertinent results include: Independently interpreted CT angio chest PE with and without contrast which  shows hiatal hernia, no evidence of focal consolidation, no evidence of pulmonary embolism.  Do not feel that she needs any additional lab work given labs performed at urgent care prior to arrival.. I agree with the radiologist interpretation.  Cardiac monitoring: EKG obtained and interpreted by myself and attending physician which shows: Sinus tachycardia, prolonged QT, no acute ST-T changes   Medications: Given her tachycardia improved after rest, symptoms are consistent with viral upper respiratory infection with cough I think that she is reasonable for discharge with Tussionex, Tessalon , close PCP follow-up, patient understands agrees plan, offered IV fluids with patient declined saying that she will orally rehydrate at home.  This is reasonable.  She has no chest pain throughout her evaluation, very low  clinical suspicion for acute cardiac pathology.   Disposition: After consideration of the diagnostic results and the patients response to treatment, I feel that patient is stable for discharge with plan as above.   emergency department workup does not suggest an emergent condition requiring admission or immediate intervention beyond what has been performed at this time. The plan is: as above. The patient is safe for discharge and has been instructed to return immediately for worsening symptoms, change in symptoms or any other concerns.   Final diagnoses:  Acute cough    ED Discharge Orders          Ordered    chlorpheniramine-HYDROcodone (TUSSIONEX) 10-8 MG/5ML  At bedtime PRN        09/24/24 1804    benzonatate  (TESSALON ) 100 MG capsule  Every 8 hours        09/24/24 1804               Shiquita Collignon, Sherlean DEL, PA-C 09/24/24 1813    Zackowski, Scott, MD 09/24/24 [phone]

## 2024-09-26 ENCOUNTER — Other Ambulatory Visit: Payer: Self-pay | Admitting: Neurology

## 2024-09-26 ENCOUNTER — Ambulatory Visit: Admitting: Neurology

## 2024-09-26 ENCOUNTER — Encounter: Payer: Self-pay | Admitting: Neurology

## 2024-09-26 VITALS — BP 164/103 | HR 91 | Ht 60.0 in | Wt 165.8 lb

## 2024-09-26 DIAGNOSIS — R413 Other amnesia: Secondary | ICD-10-CM

## 2024-09-26 DIAGNOSIS — H5461 Unqualified visual loss, right eye, normal vision left eye: Secondary | ICD-10-CM | POA: Diagnosis not present

## 2024-09-26 DIAGNOSIS — G453 Amaurosis fugax: Secondary | ICD-10-CM

## 2024-09-26 DIAGNOSIS — G3184 Mild cognitive impairment, so stated: Secondary | ICD-10-CM

## 2024-09-26 DIAGNOSIS — G43E09 Chronic migraine with aura, not intractable, without status migrainosus: Secondary | ICD-10-CM

## 2024-09-26 DIAGNOSIS — I639 Cerebral infarction, unspecified: Secondary | ICD-10-CM | POA: Diagnosis not present

## 2024-09-26 DIAGNOSIS — I671 Cerebral aneurysm, nonruptured: Secondary | ICD-10-CM

## 2024-09-26 MED ORDER — BEMPEDOIC ACID 180 MG PO TABS: 1.0000 | ORAL_TABLET | ORAL | 1 refills | Status: DC

## 2024-09-26 NOTE — Progress Notes (Addendum)
 [facility] [address] [place]. [place] [address] [phone]       OFFICE FOLLOW-UP NOTE  [name] Date of Birth:  09-22-1941 Medical Record Number:  [id number]   HPI: Ms [name] is a pleasant 83 year old Caucasian lady seen today for office visit on request from Dr. Raenelle for vision loss episodes.  She is accompanied by her husband.  History is obtained from them and review of electronic medical records and I personally reviewed pertinent available imaging films in PACS.  She has past medical history of migraines, prior strokes, anemia, thyroid  cancer, hypothyroidism, Hashimoto's thyroiditis.  She presented on 05/07/2024 to [facility] for sudden onset of transient painless right eye vision loss.  She states that she was watching TV with her husband and all of a sudden the right eye vision went dark.  It lasted maybe 30 seconds to less than a minute.  In the ER sodium was back to normal.  CT head showed no acute abnormality and CT angiogram showed no large vessel occlusion but did demonstrate moderate on the right and a 3 to 4 mm outpouching from supraclinoid right ICA which could represent infundibulum versus small aneurysm.  There is also intracranial acute process noted.  Patient was started on dual antiplatelet therapy.  Patient is asymptomatic since then.  She had no recurrent vision loss episodes or any other stroke or TIA symptoms.  She has a lifelong history of migraines she was seen by Dr. Ines in our office and has not responded to trials of multiple migraine medications as well as shots in the past.  She also states currently the migraines are controlled and not bothersome.  She has history of statin intolerance has reduced intake PCSK9 inhibitor injections.  She has also not tolerated Zetia.  Her LDL cholesterol on 7//25 was 123 mg percent.  She was 5.5.  GFR was 6 mm she denies any significant temporal headaches, jaw claudication, muscle aches or  arthralgias at present. Prior office visit 425/24 Melinda Eriksson, NP) LANISE MERGEN is a 83 y.o. female who has been followed in this office for migraine headaches. Returns today for follow-up. Husband reports that she complains of a headache daily. Usually in the frontal region. Reports photophobia and phonophobia. Denies nausea and vomiting.  Had Tigger point injections last visit and reports it helped but didn't last.  Reports that she has tried all the CGRP injectables.  She does report that she takes ergotamine with caffeine half a tablet on a daily basis.  She was also taking Advil on a daily basis but is now on Celebrex.  Patient also reports that she has low ferritin and iron  levels.  She would like this rechecked today.  She returns today for evaluat  ROS:   14 system review of systems is positive for vision loss, bruising, myalgias all other systems negative  PMH:  Past Medical History:  Diagnosis Date   Hashimoto's thyroiditis    Hypothyroidism    Migraines    Thyroid  cancer (HCC)     Social History:  Social History   Socioeconomic History   Marital status: Married    Spouse name: Not on file   Number of children: Not on file   Years of education: Not on file   Highest education level: Not on file  Occupational History   Not on file  Tobacco Use   Smoking status: Never   Smokeless tobacco: Never   Tobacco comments:  second hand exposure at work  Vaping Use   Vaping status: Never Used  Substance and Sexual Activity   Alcohol use: Never   Drug use: Never   Sexual activity: Not on file  Other Topics Concern   Not on file  Social History Narrative   Lives at home with husband   Right handed   Caffeine: 1 cup some days   Social Drivers of Corporate Investment Banker Strain: Low Risk  (04/10/2024)   Received from [facility]   Overall Financial Resource Strain (CARDIA)    Difficulty of Paying Living Expenses: Not hard at all  Food Insecurity: No Food  Insecurity (05/07/2024)   Hunger Vital Sign    Worried About Running Out of Food in the Last Year: Never true    Ran Out of Food in the Last Year: Never true  Transportation Needs: No Transportation Needs (05/07/2024)   PRAPARE - Administrator, Civil Service (Medical): No    Lack of Transportation (Non-Medical): No  Physical Activity: Not on file  Stress: Not on file  Social Connections: Moderately Integrated (05/07/2024)   Social Connection and Isolation Panel    Frequency of Communication with Friends and Family: More than three times a week    Frequency of Social Gatherings with Friends and Family: Once a week    Attends Religious Services: 1 to 4 times per year    Active Member of [employer] or Organizations: No    Attends Banker Meetings: Never    Marital Status: Married  Catering Manager Violence: Unknown (05/07/2024)   Humiliation, Afraid, Rape, and Kick questionnaire    Fear of Current or Ex-Partner: No    Emotionally Abused: No    Physically Abused: No    Sexually Abused: Not on file    Medications:   Current Outpatient Medications on File Prior to Visit  Medication Sig Dispense Refill   amLODipine (NORVASC) 2.5 MG tablet Take 2.5 mg by mouth daily.     aspirin  EC 81 MG tablet Take 1 tablet (81 mg total) by mouth daily. Swallow whole. 30 tablet 12   benzonatate  (TESSALON ) 100 MG capsule Take 1 capsule (100 mg total) by mouth every 8 (eight) hours. 21 capsule 0   chlorpheniramine-HYDROcodone (TUSSIONEX) 10-8 MG/5ML Take 5 mLs by mouth at bedtime as needed for cough. 70 mL 0   Cholecalciferol 50 MCG (2000 UT) CAPS 2 capsules Orally Once a day; Duration: 30 days     diphenhydramine-acetaminophen  (TYLENOL  PM) 25-500 MG TABS tablet Take 1 tablet by mouth at bedtime as needed.     ibuprofen (ADVIL) 200 MG tablet Take 200 mg by mouth every 6 (six) hours as needed.     Prenatal Vit-Fe Fumarate-FA (PRENATAL MULTIVITAMIN) TABS tablet Take 1 tablet by mouth daily at 12  noon.     TIROSINT 125 MCG CAPS Take 125 mcg by mouth daily.      ergotamine-caffeine (CAFERGOT) 1-100 MG tablet Take 2 tablets by mouth every hour as needed for headache or migraine. Two tablets at onset of attack; then 1 tablet every 30 minutes as needed; maximum: 6 tablets per attack; do not exceed 10 tablets/week (Patient not taking: Reported on 09/26/2024)     Current Facility-Administered Medications on File Prior to Visit  Medication Dose Route Frequency Provider Last Rate Last Admin   Erenumab -aooe SOAJ 140 mg  140 mg Subcutaneous Once Ahern, Antonia B, MD        Allergies:   Allergies  Allergen Reactions   Paxil [Paroxetine Hcl] Other (See Comments)    syncope   Celecoxib Swelling and Other (See Comments)    Swelling of ankles and feet for two weeks   Ezetimibe Other (See Comments)    Other Reaction(s): muscle pain   Propranolol Hcl Other (See Comments)    Other Reaction(s): Bloated   Atorvastatin Other (See Comments)    Other reaction(s): Myalgias (muscle pain)    Liothyronine Nausea And Vomiting    Other Reaction(s): upset stomach   Progesterone  Swelling and Other (See Comments)    Swelling in feet    Rosuvastatin Calcium Other (See Comments)    Muscle pain   Statins Other (See Comments)    Muscle pain   Sulfamethoxazole Nausea And Vomiting   Zonisamide Nausea And Vomiting and Other (See Comments)    BM turns white     Physical Exam General: well developed, well nourished pleasant elderly Caucasian lady, seated, in no evident distress Head: head normocephalic and atraumatic.  Neck: supple with no carotid or supraclavicular bruits Cardiovascular: regular rate and rhythm, no murmurs Musculoskeletal: Mild kyphoscoliosis  skin:  no rash/petichiae Vascular:  Normal pulses all extremities Vitals:   09/26/24 0857  BP: (!) 164/103  Pulse: 91  SpO2: 96%   Neurologic Exam Mental Status: Awake and fully alert. Oriented to place and time. Recent and remote memory  intact. Attention span, concentration and fund of knowledge appropriate. Mood and affect appropriate.  She scored 25/30 with deficits in calculation and comprehension.  Clock drawing was poor.  Able to name only 5 animals which can walk on  4 legs.  Functional activity questionnaire completed independent..  Geriatric score not depressed Cranial Nerves: Fundoscopic exam reveals sharp disc margins. Pupils equal, briskly reactive to light. Extraocular movements full without nystagmus. Visual fields full to confrontation. Hearing intact. Facial sensation intact. Face, tongue, palate moves normally and symmetrically.  Motor: Normal bulk and tone. Normal strength in all tested extremity muscles. Sensory.: intact to touch ,pinprick .position and vibratory sensation.  Coordination: Rapid alternating movements normal in all extremities. Finger-to-nose and heel-to-shin performed accurately bilaterally. Gait and Station: Arises from chair without difficulty. Stance is normal. Gait demonstrates normal stride length and balance . Able to heel, toe and tandem walk with  difficulty.  Reflexes: 1+ and symmetric. Toes downgoing.   NIHSS  0 Modified Rankin  0    09/26/2024    9:00 AM  MMSE - Mini Mental State Exam  Orientation to time 5  Orientation to Place 5  Registration 3  Attention/ Calculation 1  Recall 3  Language- name 2 objects 2  Language- repeat 1  Language- follow 3 step command 2  Language- read & follow direction 1  Write a sentence 1  Copy design 1  Total score 25      ASSESSMENT: 83 year old Caucasian lady with transient episode of painless right eye vision loss likely TIA-amaurosis fugax with abnormal MRI scan of brain showing silent left basal ganglia lacunar infarct as well as small asymptomatic cerebral aneurysm.  She also has chronic migraine which appears stable as well as mild cognitive impairment.        PLAN:I had a long d/w patient and her husband about her episode of  transient vision loss, abnormal MRI showing silent stroke and small asymptomatic cerebral aneurysm, memory loss and mild cognitive impairment, risk for recurrent stroke/TIAs, personally independently reviewed imaging studies and stroke evaluation results and answered questions.Continue aspirin  81 mg daily  for secondary  stroke prevention and maintain strict control of hypertension with blood pressure goal below 130/90, diabetes with hemoglobin A1c goal below 6.5% and lipids with LDL cholesterol goal below 70 mg/dL. I also advised the patient to eat a healthy diet with plenty of whole grains, cereals, fruits and vegetables, exercise regularly and maintain ideal body weight .patient has had intolerance to statins Zetia and PCSK9 inhibitor injections I recommend she try bempedoic acid  180 mg daily if she fails this may consider trying Leqvio injections 6 to 12 months.  I recommend she increase participation in cognitively challenging activities like solving crossword puzzles, playing bridge and sudoku and we will check memory panel labs.  I also discussed memory compensation strategies.  Continue current management for her chronic migraines which appear stable.  Followup in the future with Duwaine Terry nurse practitioner in 3 months or call earlier if necessary.   I personally spent a total of 50 minutes in the care of the patient today including getting/reviewing separately obtained history, performing a medically appropriate exam/evaluation, counseling and educating, placing orders, referring and communicating with other health care professionals, documenting clinical information in the EHR, independently interpreting results, and coordinating care.        Eather Popp, MD Note: This document was prepared with digital dictation and possible smart phrase technology. Any transcriptional errors that result from this process are unintentional

## 2024-09-26 NOTE — Patient Instructions (Signed)
 I had a long d/w patient and her husband about her episode of transient vision loss, abnormal MRI showing silent stroke and small asymptomatic cerebral aneurysm, memory loss and mild cognitive impairment, risk for recurrent stroke/TIAs, personally independently reviewed imaging studies and stroke evaluation results and answered questions.Continue aspirin  81 mg daily  for secondary stroke prevention and maintain strict control of hypertension with blood pressure goal below 130/90, diabetes with hemoglobin A1c goal below 6.5% and lipids with LDL cholesterol goal below 70 mg/dL. I also advised the patient to eat a healthy diet with plenty of whole grains, cereals, fruits and vegetables, exercise regularly and maintain ideal body weight .patient has had intolerance to statins Zetia and PCSK9 inhibitor injections I recommend she try bempedoic acid  180 mg daily if she fails this may consider trying Leqvio injections 6 to 12 months.  I recommend she increase participation in cognitively challenging activities like solving crossword puzzles, playing bridge and sudoku and we will check memory panel labs.  I also discussed memory compensation strategies.  Continue current management for her chronic migraines which appear stable.  Followup in the future with Duwaine Eriksson nurse practitioner in 3 months or call earlier if necessary.  Memory Compensation Strategies  Use WARM strategy.  W= write it down  A= associate it  R= repeat it  M= make a mental note  2.   You can keep a Glass Blower/designer.  Use a 3-ring notebook with sections for the following: calendar, important names and phone numbers,  medications, doctors' names/phone numbers, lists/reminders, and a section to journal what you did  each day.   3.    Use a calendar to write appointments down.  4.    Write yourself a schedule for the day.  This can be placed on the calendar or in a separate section of the Memory Notebook.  Keeping a  regular schedule can  help memory.  5.    Use medication organizer with sections for each day or morning/evening pills.  You may need help loading it  6.    Keep a basket, or pegboard by the door.  Place items that you need to take out with you in the basket or on the pegboard.  You may also want to  include a message board for reminders.  7.    Use sticky notes.  Place sticky notes with reminders in a place where the task is performed.  For example:  turn off the  stove placed by the stove, lock the door placed on the door at eye level,  take your medications on  the bathroom mirror or by the place where you normally take your medications.  8.    Use alarms/timers.  Use while cooking to remind yourself to check on food or as a reminder to take your medicine, or as a  reminder to make a call, or as a reminder to perform another task, etc.   Bempedoic Acid  Tablets What is this medication? BEMPEDOIC ACID  (BEM pe DOE ik AS id) treats high cholesterol and reduces the risk of heart attack. It works by reducing the amount of cholesterol made by your body. Changes to diet and exercise are often combined with this medication. This medicine may be used for other purposes; ask your health care provider or pharmacist if you have questions. COMMON BRAND NAME(S): NEXLETOL  What should I tell my care team before I take this medication? They need to know if you have any of these conditions: Gout Kidney  problems Liver disease Tendon problems An unusual or allergic reaction to bempedoic acid , other medications, foods, dyes, or preservatives Pregnant or trying to become pregnant Breast-feeding How should I use this medication? Take this medication by mouth. Take it as directed on the prescription label at the same time every day. You can take it with or without food. If it upsets your stomach, take it with food. Keep taking it unless your care team tells you to stop. Talk to your care team about the use of this  medication in children. Special care may be needed. Overdosage: If you think you have taken too much of this medicine contact a poison control center or emergency room at once. NOTE: This medicine is only for you. Do not share this medicine with others. What if I miss a dose? If you miss a dose, take it as soon as you can. If it is almost time for your next dose, take only that dose. Do not take double or extra doses. What may interact with this medication? Pravastatin Simvastatin This list may not describe all possible interactions. Give your health care provider a list of all the medicines, herbs, non-prescription drugs, or dietary supplements you use. Also tell them if you smoke, drink alcohol, or use illegal drugs. Some items may interact with your medicine. What should I watch for while using this medication? Visit your care team for regular checks on your progress. Tell your care team if your symptoms do not start to get better or if they get worse. Your care team may tell you to stop taking this medication if you develop muscle problems. If your muscle problems do not go away after stopping this medication, contact your care team. Taking this medication is only part of a total heart healthy program. Ask your care team if there are other changes you can make to improve your overall health. What side effects may I notice from receiving this medication? Side effects that you should report to your care team as soon as possible: Allergic reactions--skin rash, itching, hives, swelling of the face, lips, tongue, or throat High uric acid level--severe pain, redness, warmth, or swelling in joints, pain or trouble passing urine, pain in the lower back or sides Joint, muscle, or tendon pain, swelling, or stiffness Side effects that usually do not require medical attention (report to your care team if they continue or are bothersome): Diarrhea Muscle spasms Stomach pain This list may not describe  all possible side effects. Call your doctor for medical advice about side effects. You may report side effects to FDA at 1-800-FDA-1088. Where should I keep my medication? Keep out of the reach of children and pets. Store between 15 and 30 degrees C (59 and 86 degrees F). Keep this medication in the original container. Protect from moisture. Keep the container tightly closed. Do not throw out the packet in the container. It keeps the medication dry. Get rid of any unused medication after the expiration date. To get rid of medications that are no longer needed or have expired: Take the medication to a medication take-back program. Check with your pharmacy or law enforcement to find a location. If you cannot return the medication, check the label or package insert to see if the medication should be thrown out in the trash or flushed down the toilet. If you are not sure, ask your care team. If it is safe to put it in the trash, empty the medication out of the container. Mix the  medication with cat litter, dirt, coffee grounds, or other unwanted substance. Seal the mixture in a bag or container. Put it in the trash. NOTE: This sheet is a summary. It may not cover all possible information. If you have questions about this medicine, talk to your doctor, pharmacist, or health care provider.  2024 Elsevier/Gold Standard (2023-10-01 00:00:00)

## 2024-09-27 ENCOUNTER — Other Ambulatory Visit (HOSPITAL_COMMUNITY): Payer: Self-pay

## 2024-09-27 ENCOUNTER — Encounter: Payer: Self-pay | Admitting: Hematology & Oncology

## 2024-09-27 ENCOUNTER — Ambulatory Visit: Payer: Self-pay | Admitting: Neurology

## 2024-09-27 ENCOUNTER — Telehealth: Payer: Self-pay | Admitting: *Deleted

## 2024-09-27 NOTE — Telephone Encounter (Signed)
 Hello Prescriber!  We are in the process of submitting a prior authorization for your patient for Nexletrol. We are reaching out for clinical guidance for the following information to complete the request: Plan requires documentation of FDA approved diagnosis code for the prescribed medication. Please add to patient's problem list or update patient's recent OV for future PA needs. Thanks!   Thank you! Pharmacy Team

## 2024-09-27 NOTE — Telephone Encounter (Signed)
 SABRA

## 2024-09-27 NOTE — Telephone Encounter (Signed)
 Message sent to PA team.

## 2024-09-27 NOTE — Progress Notes (Signed)
 Kindly inform the patient that lab work for reversible causes of memory loss was mostly normal however homocystine level results are not yet back

## 2024-09-28 LAB — DEMENTIA PANEL
Homocysteine: 9.7 umol/L (ref 0.0–21.3)
RPR Ser Ql: NONREACTIVE
TSH: 2.13 u[IU]/mL (ref 0.450–4.500)
Vitamin B-12: 744 pg/mL (ref 232–1245)

## 2024-10-02 NOTE — Telephone Encounter (Signed)
 Rosemarie Eather RAMAN, MD to Me  (Selected Message)     10/02/24  5:12 PM Essential hyperlipidimia  E78.5 Me to Rosemarie Eather RAMAN, MD

## 2024-10-03 ENCOUNTER — Inpatient Hospital Stay: Admitting: Neurology

## 2024-10-03 ENCOUNTER — Other Ambulatory Visit (HOSPITAL_COMMUNITY): Payer: Self-pay

## 2024-10-03 NOTE — Telephone Encounter (Signed)
 Pharmacy Patient Advocate Encounter   Received notification from CoverMyMeds that prior authorization for Nexletol  180mg  Tablets is required/requested.   Insurance verification completed.   The patient is insured through [employer].   Per test claim: PA required; PA submitted to above mentioned insurance via Latent Key/confirmation #/EOC [id number] Status is pending  DX code was not updated on the last chart  note so I sent the telephone note with DX code to support.

## 2024-10-03 NOTE — Telephone Encounter (Signed)
 Faxed PA approval to pharmacy.  [facility].

## 2024-10-03 NOTE — Telephone Encounter (Addendum)
 Pharmacy Patient Advocate Encounter  Received notification from [employer] that Prior Authorization for Nexletol  has been APPROVED from 10/03/2024 to 11/01/2025. Ran test claim, Copay is $40.00. This test claim was processed through [facility]- copay amounts may vary at other pharmacies due to pharmacy/plan contracts, or as the patient moves through the different stages of their insurance plan.   PA #/Case ID/Reference #: [id number]

## 2024-10-11 ENCOUNTER — Inpatient Hospital Stay: Attending: Hematology and Oncology

## 2024-10-11 ENCOUNTER — Encounter: Payer: Self-pay | Admitting: Medical Oncology

## 2024-10-11 ENCOUNTER — Inpatient Hospital Stay (HOSPITAL_BASED_OUTPATIENT_CLINIC_OR_DEPARTMENT_OTHER): Admitting: Medical Oncology

## 2024-10-11 VITALS — BP 140/92 | HR 100 | Temp 97.8°F | Resp 18 | Wt 166.0 lb

## 2024-10-11 DIAGNOSIS — E639 Nutritional deficiency, unspecified: Secondary | ICD-10-CM | POA: Diagnosis not present

## 2024-10-11 DIAGNOSIS — Z862 Personal history of diseases of the blood and blood-forming organs and certain disorders involving the immune mechanism: Secondary | ICD-10-CM

## 2024-10-11 DIAGNOSIS — D508 Other iron deficiency anemias: Secondary | ICD-10-CM | POA: Insufficient documentation

## 2024-10-11 DIAGNOSIS — K909 Intestinal malabsorption, unspecified: Secondary | ICD-10-CM | POA: Diagnosis not present

## 2024-10-11 DIAGNOSIS — E611 Iron deficiency: Secondary | ICD-10-CM

## 2024-10-11 DIAGNOSIS — I10 Essential (primary) hypertension: Secondary | ICD-10-CM | POA: Insufficient documentation

## 2024-10-11 DIAGNOSIS — Z85850 Personal history of malignant neoplasm of thyroid: Secondary | ICD-10-CM | POA: Insufficient documentation

## 2024-10-11 LAB — RETIC PANEL
Immature Retic Fract: 12.2 % (ref 2.3–15.9)
RBC.: 4.78 MIL/uL (ref 3.87–5.11)
Retic Count, Absolute: 97 K/uL (ref 19.0–186.0)
Retic Ct Pct: 2 % (ref 0.4–3.1)
Reticulocyte Hemoglobin: 33 pg (ref >27.9)

## 2024-10-11 LAB — VITAMIN B12: Vitamin B-12: 504 pg/mL (ref 180–914)

## 2024-10-11 LAB — IRON AND IRON BINDING CAPACITY (CC-WL,HP ONLY)
Iron: 100 ug/dL (ref 28–170)
Saturation Ratios: 29 % (ref 10.4–31.8)
TIBC: 344 ug/dL (ref 250–450)
UIBC: 244 ug/dL

## 2024-10-11 LAB — CBC
HCT: 44.5 % (ref 36.0–46.0)
Hemoglobin: 14.1 g/dL (ref 12.0–15.0)
MCH: 29 pg (ref 26.0–34.0)
MCHC: 31.7 g/dL (ref 30.0–36.0)
MCV: 91.6 fL (ref 80.0–100.0)
Platelets: 338 K/uL (ref 150–400)
RBC: 4.86 MIL/uL (ref 3.87–5.11)
RDW: 13.2 % (ref 11.5–15.5)
WBC: 6.9 K/uL (ref 4.0–10.5)
nRBC: 0 % (ref 0.0–0.2)

## 2024-10-11 LAB — FERRITIN: Ferritin: 22 ng/mL (ref 11–307)

## 2024-10-11 NOTE — Progress Notes (Signed)
 Hematology and Oncology Follow Up Visit  [name] [id number] 02/25/1941 83 y.o. 10/11/2024  Past Medical History:  Diagnosis Date   Hashimoto's thyroiditis    Hypothyroidism    Migraines    Thyroid  cancer (HCC)     Principle Diagnosis:  IDA suspected to be secondary to malabsorption  Current Therapy:   IV Iron - Venofer  300 mg last dose 10/15/2023 D/C oral iron  due to GI side effects    Interim History:  [name] is here for IDA follow up. Formerly seen at [facility] by Dr. Gudena. She is here with her husband.   She has a longstanding history of low ferritin levels. Most recent iron  history shown below: 02/06/23: Ferritin 6.2, TIBC 432 02/25/2023: Hemoglobin 12.5, MCV 85.8, TIBC 345, iron  saturation 15%, ferritin 15 06/14/2023: Hemoglobin 13.7, MCV 86.8, iron  saturation 20%, ferritin 32 09/02/2023: Hgb 14.1, iron  sat 21%, ferritin 18 12/27/2023: Hgb 15.1, iron  sat 49%, ferritin 104 07/26/2024: Hgb 14.3, iron  sat 31%, ferritin 55  Her most recent iron  infusion was IV Venofer  given on 10/08/2023 and 10/15/2023.    Today she states that she is doing well but has had fatigue. She was seen at Urgent care on 09/24/2024 at which time labs showed that her iron  levels had dropped. Ferritin was 29 and iron  saturation was 9%. She started an organ meat supplement which she is taking one pill once daily. This has helped some. No known bleeding.   She has been started on amlodipine 2.5 mg once night which has helped with he migraines. She has seen GYN[facility] in [place] for this and had a transvaginal US , abdominal CT and CTA. No tumor has been visualized. She is seeing endocrinology Dr. Braulio on October 9th to get a third opinion. She also has chronic headaches for which she is seen by neurology.    September 2024 she had an endoscopy which showed a medium hiatal hernia. Colonoscopy of 2023 was normal with diverticula. I suggested a possible Capsule study at a previous visit. She  has also recently had an Ifob test which was negative.   There has been no bleeding to her knowledge: denies epistaxis, gingivitis, hemoptysis, hematemesis, hematuria, melena, excessive bruising, blood donation.  She has had a sleep study - [facility]) 2023-which did not show any significant apnea.   No SOB, chest pain or pica. No abdominal pains.   Wt Readings from Last 3 Encounters:  10/11/24 166 lb 0.6 oz (75.3 kg)  09/26/24 165 lb 12.8 oz (75.2 kg)  09/24/24 160 lb (72.6 kg)     Medications:   Current Outpatient Medications:    amLODipine (NORVASC) 2.5 MG tablet, Take 2.5 mg by mouth daily., Disp: , Rfl:    aspirin  EC 81 MG tablet, Take 1 tablet (81 mg total) by mouth daily. Swallow whole., Disp: 30 tablet, Rfl: 12   diphenhydramine-acetaminophen  (TYLENOL  PM) 25-500 MG TABS tablet, Take 1 tablet by mouth at bedtime as needed., Disp: , Rfl:    OVER THE COUNTER MEDICATION, Take 20 mg by mouth daily. (Patient taking differently: Take 20 mg by mouth daily. Iron  Repair Simpy), Disp: , Rfl:    Prenatal Vit-Fe Fumarate-FA (PRENATAL MULTIVITAMIN) TABS tablet, Take 1 tablet by mouth daily at 12 noon., Disp: , Rfl:    TIROSINT 125 MCG CAPS, Take 125 mcg by mouth daily. , Disp: , Rfl:    ibuprofen (ADVIL) 200 MG tablet, Take 200 mg by mouth every 6 (six) hours as needed., Disp: , Rfl:  Current Facility-Administered Medications:    Erenumab -aooe SOAJ 140 mg, 140 mg, Subcutaneous, Once, [name], MD  Allergies:  Allergies  Allergen Reactions   Paxil [Paroxetine Hcl] Other (See Comments)    syncope   Celecoxib Swelling and Other (See Comments)    Swelling of ankles and feet for two weeks   Ezetimibe Other (See Comments)    Other Reaction(s): muscle pain   Propranolol Hcl Other (See Comments)    Other Reaction(s): Bloated   Atorvastatin Other (See Comments)    Other reaction(s): Myalgias (muscle pain)    Liothyronine Nausea And Vomiting    Other Reaction(s): upset  stomach   Progesterone  Swelling and Other (See Comments)    Swelling in feet    Rosuvastatin Calcium Other (See Comments)    Muscle pain   Statins Other (See Comments)    Muscle pain   Sulfamethoxazole Nausea And Vomiting   Zonisamide Nausea And Vomiting and Other (See Comments)    BM turns white     Past Medical History, Surgical history, Social history, and Family History were reviewed and updated.  Review of Systems: As stated above in HPI   Physical Exam:  weight is 166 lb 0.6 oz (75.3 kg). Her oral temperature is 97.8 F (36.6 C). Her blood pressure is 140/92 (abnormal) and her pulse is 100. Her respiration is 18 and oxygen saturation is 98%.   Physical Exam Vitals and nursing note reviewed.  Constitutional:      General: She is not in acute distress.    Appearance: Normal appearance. She is not ill-appearing, toxic-appearing or diaphoretic.  HENT:     Mouth/Throat:     Mouth: Mucous membranes are moist.  Cardiovascular:     Rate and Rhythm: Normal rate and regular rhythm.     Heart sounds: Normal heart sounds.  Pulmonary:     Effort: Pulmonary effort is normal.     Breath sounds: Normal breath sounds.  Abdominal:     Palpations: Abdomen is soft.  Skin:    General: Skin is warm.     Coloration: Skin is not pale.  Neurological:     Mental Status: She is alert.      Lab Results  Component Value Date   WBC 6.9 10/11/2024   HGB 14.1 10/11/2024   HCT 44.5 10/11/2024   MCV 91.6 10/11/2024   PLT 338 10/11/2024     Chemistry      Component Value Date/Time   NA 139 05/07/2024 1237   K 3.6 05/07/2024 1237   CL 104 05/07/2024 1237   CO2 26 05/07/2024 1237   BUN 16 05/07/2024 1237   CREATININE 0.61 05/07/2024 1237   CREATININE 0.58 09/02/2023 1423      Component Value Date/Time   CALCIUM 9.0 05/07/2024 1237   ALKPHOS 78 05/07/2024 1237   AST 19 05/07/2024 1237   AST 17 09/02/2023 1423   ALT 20 05/07/2024 1237   ALT 10 09/02/2023 1423   BILITOT 1.0  05/07/2024 1237   BILITOT 0.6 09/02/2023 1423     Encounter Diagnoses  Name Primary?   Iron  deficiency Yes   Nutritional deficiency    History of iron  deficiency anemia      Assessment and Plan- Patient is a 83 y.o. female with IDA thought to be secondary to malabsorption. She has had a recent endoscopy and colonoscopy. She has not yet had a capsule study.   Labs show a Hgb of 14.1 MCV 91.6 Normal retic Iron  levels pending-  will replenish if needed with IV iron  or have her double her oral iron  if it seems to be effective.   She has white coat hypertension. Home readings are around 120/80  Disposition: RTC 2 months APP, labs (CBC, iron , ferritin, retic, B12)-[place]    Lauraine Dais PA-C 12/10/202511:50 AM

## 2024-10-16 ENCOUNTER — Other Ambulatory Visit: Payer: Self-pay | Admitting: Medical Oncology

## 2024-10-16 ENCOUNTER — Ambulatory Visit: Payer: Self-pay | Admitting: Medical Oncology

## 2024-10-24 ENCOUNTER — Inpatient Hospital Stay

## 2024-10-24 VITALS — BP 171/82 | HR 86 | Temp 97.7°F | Resp 16

## 2024-10-24 DIAGNOSIS — Z862 Personal history of diseases of the blood and blood-forming organs and certain disorders involving the immune mechanism: Secondary | ICD-10-CM

## 2024-10-24 DIAGNOSIS — D508 Other iron deficiency anemias: Secondary | ICD-10-CM | POA: Diagnosis not present

## 2024-10-24 MED ORDER — IRON SUCROSE 300 MG IVPB - SIMPLE MED
300.0000 mg | Freq: Once | Status: AC
  Administered 2024-10-24: 300 mg via INTRAVENOUS
  Filled 2024-10-24: qty 300

## 2024-10-24 MED ORDER — SODIUM CHLORIDE 0.9 % IV SOLN: INTRAVENOUS | Status: DC

## 2024-10-24 NOTE — Patient Instructions (Signed)

## 2024-10-24 NOTE — Progress Notes (Signed)
Patient refused to wait 30 minutes post infusion. Released stable and ASX. 

## 2024-10-25 ENCOUNTER — Ambulatory Visit: Admitting: Medical Oncology

## 2024-10-25 ENCOUNTER — Inpatient Hospital Stay

## 2024-10-31 ENCOUNTER — Inpatient Hospital Stay

## 2024-10-31 VITALS — BP 179/102 | HR 92 | Resp 16 | Wt 170.0 lb

## 2024-10-31 DIAGNOSIS — Z862 Personal history of diseases of the blood and blood-forming organs and certain disorders involving the immune mechanism: Secondary | ICD-10-CM

## 2024-10-31 DIAGNOSIS — D508 Other iron deficiency anemias: Secondary | ICD-10-CM | POA: Diagnosis not present

## 2024-10-31 MED ORDER — IRON SUCROSE 300 MG IVPB - SIMPLE MED
300.0000 mg | Freq: Once | Status: AC
  Administered 2024-10-31: 300 mg via INTRAVENOUS
  Filled 2024-10-31: qty 300

## 2024-10-31 MED ORDER — SODIUM CHLORIDE 0.9 % IV SOLN: INTRAVENOUS | Status: DC

## 2024-10-31 NOTE — Patient Instructions (Signed)

## 2024-11-07 ENCOUNTER — Inpatient Hospital Stay: Attending: Hematology and Oncology

## 2024-11-07 ENCOUNTER — Encounter: Payer: Self-pay | Admitting: Hematology & Oncology

## 2024-11-07 VITALS — BP 173/89 | HR 96 | Temp 97.8°F | Resp 18

## 2024-11-07 DIAGNOSIS — D509 Iron deficiency anemia, unspecified: Secondary | ICD-10-CM | POA: Insufficient documentation

## 2024-11-07 DIAGNOSIS — Z862 Personal history of diseases of the blood and blood-forming organs and certain disorders involving the immune mechanism: Secondary | ICD-10-CM

## 2024-11-07 MED ORDER — IRON SUCROSE 300 MG IVPB - SIMPLE MED
300.0000 mg | Freq: Once | Status: AC
  Administered 2024-11-07: 300 mg via INTRAVENOUS
  Filled 2024-11-07: qty 300

## 2024-11-07 MED ORDER — SODIUM CHLORIDE 0.9 % IV SOLN: INTRAVENOUS | Status: DC

## 2024-11-07 NOTE — Patient Instructions (Signed)

## 2024-11-07 NOTE — Progress Notes (Signed)
 Pt. declined to stay for 30 min. post observation period. Pt. Stable upon discharge.

## 2024-12-12 ENCOUNTER — Inpatient Hospital Stay: Admitting: Medical Oncology

## 2024-12-12 ENCOUNTER — Inpatient Hospital Stay: Attending: Hematology and Oncology

## 2025-01-16 ENCOUNTER — Ambulatory Visit: Admitting: Adult Health
